# Patient Record
Sex: Female | Born: 1935 | Race: White | Hispanic: No | Smoking: Former smoker
Health system: Southern US, Community
[De-identification: ages and names within clinical notes are randomized; demographics above are authoritative.]

## PROBLEM LIST (undated history)

## (undated) DIAGNOSIS — E785 Hyperlipidemia, unspecified: Secondary | ICD-10-CM

## (undated) DIAGNOSIS — N259 Disorder resulting from impaired renal tubular function, unspecified: Secondary | ICD-10-CM

## (undated) DIAGNOSIS — H3530 Unspecified macular degeneration: Secondary | ICD-10-CM

## (undated) DIAGNOSIS — C801 Malignant (primary) neoplasm, unspecified: Secondary | ICD-10-CM

## (undated) DIAGNOSIS — J4489 Other specified chronic obstructive pulmonary disease: Secondary | ICD-10-CM

## (undated) DIAGNOSIS — R112 Nausea with vomiting, unspecified: Secondary | ICD-10-CM

## (undated) DIAGNOSIS — G459 Transient cerebral ischemic attack, unspecified: Secondary | ICD-10-CM

## (undated) DIAGNOSIS — Z9889 Other specified postprocedural states: Secondary | ICD-10-CM

## (undated) DIAGNOSIS — K449 Diaphragmatic hernia without obstruction or gangrene: Secondary | ICD-10-CM

## (undated) DIAGNOSIS — R590 Localized enlarged lymph nodes: Secondary | ICD-10-CM

## (undated) DIAGNOSIS — J329 Chronic sinusitis, unspecified: Secondary | ICD-10-CM

## (undated) DIAGNOSIS — I10 Essential (primary) hypertension: Secondary | ICD-10-CM

## (undated) DIAGNOSIS — C3491 Malignant neoplasm of unspecified part of right bronchus or lung: Secondary | ICD-10-CM

## (undated) DIAGNOSIS — J449 Chronic obstructive pulmonary disease, unspecified: Secondary | ICD-10-CM

## (undated) HISTORY — PX: TOTAL ABDOMINAL HYSTERECTOMY: SHX209

## (undated) HISTORY — PX: APPENDECTOMY: SHX54

## (undated) HISTORY — PX: INCISIONAL HERNIA REPAIR: SHX193

## (undated) HISTORY — DX: Other specified chronic obstructive pulmonary disease: J44.89

## (undated) HISTORY — DX: Diaphragmatic hernia without obstruction or gangrene: K44.9

## (undated) HISTORY — PX: OTHER SURGICAL HISTORY: SHX169

## (undated) HISTORY — DX: Localized enlarged lymph nodes: R59.0

## (undated) HISTORY — DX: Chronic sinusitis, unspecified: J32.9

## (undated) HISTORY — DX: Hyperlipidemia, unspecified: E78.5

## (undated) HISTORY — DX: Malignant neoplasm of unspecified part of right bronchus or lung: C34.91

## (undated) HISTORY — DX: Disorder resulting from impaired renal tubular function, unspecified: N25.9

## (undated) HISTORY — DX: Essential (primary) hypertension: I10

## (undated) HISTORY — PX: CATARACT EXTRACTION: SUR2

## (undated) HISTORY — DX: Chronic obstructive pulmonary disease, unspecified: J44.9

## (undated) HISTORY — DX: Unspecified macular degeneration: H35.30

## (undated) HISTORY — PX: CHOLECYSTECTOMY: SHX55

## (undated) HISTORY — PX: BILATERAL SALPINGOOPHORECTOMY: SHX1223

---

## 1997-10-01 ENCOUNTER — Other Ambulatory Visit: Admission: RE | Admit: 1997-10-01 | Discharge: 1997-10-01 | Payer: Self-pay | Admitting: *Deleted

## 2001-05-03 ENCOUNTER — Encounter: Payer: Self-pay | Admitting: *Deleted

## 2001-05-03 ENCOUNTER — Inpatient Hospital Stay (HOSPITAL_COMMUNITY): Admission: EM | Admit: 2001-05-03 | Discharge: 2001-05-05 | Payer: Self-pay | Admitting: Emergency Medicine

## 2002-06-11 ENCOUNTER — Encounter: Payer: Self-pay | Admitting: *Deleted

## 2002-06-11 ENCOUNTER — Encounter: Payer: Self-pay | Admitting: Orthopedic Surgery

## 2002-06-11 ENCOUNTER — Emergency Department (HOSPITAL_COMMUNITY): Admission: EM | Admit: 2002-06-11 | Discharge: 2002-06-11 | Payer: Self-pay | Admitting: *Deleted

## 2002-06-14 ENCOUNTER — Encounter: Admission: RE | Admit: 2002-06-14 | Discharge: 2002-06-14 | Payer: Self-pay | Admitting: Orthopedic Surgery

## 2002-06-14 ENCOUNTER — Encounter: Payer: Self-pay | Admitting: Orthopedic Surgery

## 2002-06-15 ENCOUNTER — Ambulatory Visit (HOSPITAL_BASED_OUTPATIENT_CLINIC_OR_DEPARTMENT_OTHER): Admission: RE | Admit: 2002-06-15 | Discharge: 2002-06-15 | Payer: Self-pay | Admitting: Orthopedic Surgery

## 2002-11-03 ENCOUNTER — Encounter: Payer: Self-pay | Admitting: Unknown Physician Specialty

## 2002-11-03 ENCOUNTER — Ambulatory Visit (HOSPITAL_COMMUNITY): Admission: RE | Admit: 2002-11-03 | Discharge: 2002-11-03 | Payer: Self-pay | Admitting: Unknown Physician Specialty

## 2002-12-19 ENCOUNTER — Encounter: Admission: RE | Admit: 2002-12-19 | Discharge: 2003-01-02 | Payer: Self-pay | Admitting: Specialist

## 2003-02-01 ENCOUNTER — Emergency Department (HOSPITAL_COMMUNITY): Admission: EM | Admit: 2003-02-01 | Discharge: 2003-02-01 | Payer: Self-pay

## 2003-02-01 ENCOUNTER — Encounter: Payer: Self-pay | Admitting: Internal Medicine

## 2003-04-09 ENCOUNTER — Encounter: Admission: RE | Admit: 2003-04-09 | Discharge: 2003-04-09 | Payer: Self-pay | Admitting: Internal Medicine

## 2003-11-14 ENCOUNTER — Encounter: Admission: RE | Admit: 2003-11-14 | Discharge: 2003-11-14 | Payer: Self-pay | Admitting: Internal Medicine

## 2004-07-14 ENCOUNTER — Ambulatory Visit: Payer: Self-pay | Admitting: Internal Medicine

## 2004-10-09 ENCOUNTER — Ambulatory Visit: Payer: Self-pay | Admitting: Family Medicine

## 2004-11-11 ENCOUNTER — Ambulatory Visit: Payer: Self-pay | Admitting: Internal Medicine

## 2005-03-12 ENCOUNTER — Ambulatory Visit: Payer: Self-pay | Admitting: Internal Medicine

## 2005-04-29 ENCOUNTER — Ambulatory Visit: Payer: Self-pay | Admitting: Family Medicine

## 2005-07-09 ENCOUNTER — Ambulatory Visit: Payer: Self-pay | Admitting: Internal Medicine

## 2005-08-05 ENCOUNTER — Ambulatory Visit: Payer: Self-pay | Admitting: Family Medicine

## 2006-01-05 ENCOUNTER — Ambulatory Visit: Payer: Self-pay | Admitting: Family Medicine

## 2006-01-07 ENCOUNTER — Ambulatory Visit: Payer: Self-pay | Admitting: Internal Medicine

## 2006-03-08 ENCOUNTER — Ambulatory Visit: Payer: Self-pay | Admitting: Family Medicine

## 2006-03-12 ENCOUNTER — Ambulatory Visit: Payer: Self-pay | Admitting: Family Medicine

## 2006-07-08 ENCOUNTER — Ambulatory Visit: Payer: Self-pay | Admitting: Internal Medicine

## 2006-08-05 ENCOUNTER — Ambulatory Visit: Payer: Self-pay | Admitting: Family Medicine

## 2006-08-13 ENCOUNTER — Emergency Department (HOSPITAL_COMMUNITY): Admission: EM | Admit: 2006-08-13 | Discharge: 2006-08-13 | Payer: Self-pay | Admitting: Emergency Medicine

## 2006-08-16 ENCOUNTER — Ambulatory Visit: Payer: Self-pay | Admitting: Family Medicine

## 2006-09-29 ENCOUNTER — Ambulatory Visit: Payer: Self-pay | Admitting: Family Medicine

## 2007-01-20 ENCOUNTER — Ambulatory Visit: Payer: Self-pay | Admitting: Internal Medicine

## 2007-07-18 DIAGNOSIS — I10 Essential (primary) hypertension: Secondary | ICD-10-CM

## 2007-07-18 DIAGNOSIS — J329 Chronic sinusitis, unspecified: Secondary | ICD-10-CM | POA: Insufficient documentation

## 2007-07-18 DIAGNOSIS — N259 Disorder resulting from impaired renal tubular function, unspecified: Secondary | ICD-10-CM | POA: Insufficient documentation

## 2007-07-18 DIAGNOSIS — K449 Diaphragmatic hernia without obstruction or gangrene: Secondary | ICD-10-CM | POA: Insufficient documentation

## 2007-08-02 ENCOUNTER — Ambulatory Visit: Payer: Self-pay | Admitting: Internal Medicine

## 2007-08-07 DIAGNOSIS — J449 Chronic obstructive pulmonary disease, unspecified: Secondary | ICD-10-CM

## 2007-08-07 DIAGNOSIS — J4489 Other specified chronic obstructive pulmonary disease: Secondary | ICD-10-CM | POA: Insufficient documentation

## 2007-08-07 DIAGNOSIS — E785 Hyperlipidemia, unspecified: Secondary | ICD-10-CM

## 2008-02-02 ENCOUNTER — Ambulatory Visit: Payer: Self-pay | Admitting: Internal Medicine

## 2008-02-02 DIAGNOSIS — H3530 Unspecified macular degeneration: Secondary | ICD-10-CM | POA: Insufficient documentation

## 2008-07-31 ENCOUNTER — Ambulatory Visit: Payer: Self-pay | Admitting: Internal Medicine

## 2009-01-28 ENCOUNTER — Ambulatory Visit: Payer: Self-pay | Admitting: Internal Medicine

## 2009-01-28 DIAGNOSIS — J302 Other seasonal allergic rhinitis: Secondary | ICD-10-CM | POA: Insufficient documentation

## 2009-01-28 DIAGNOSIS — J3089 Other allergic rhinitis: Secondary | ICD-10-CM

## 2009-07-29 ENCOUNTER — Ambulatory Visit: Payer: Self-pay | Admitting: Internal Medicine

## 2010-01-27 ENCOUNTER — Ambulatory Visit: Payer: Self-pay | Admitting: Internal Medicine

## 2010-02-03 ENCOUNTER — Ambulatory Visit: Payer: Self-pay | Admitting: Internal Medicine

## 2010-06-19 NOTE — Assessment & Plan Note (Signed)
Summary: rov 6 months///kp   Primary Provider/Referring Provider:  Ardeen Garland  CC:  6 month follow up visit-COPD and rhinitis. Having SOB alot-with activity..  History of Present Illness: 01/28/09 COPD, hx chronic sinusitis, large hiatal hernia Blames ragweed now for increased nasal congestion. She sits with a lady whose home is surrounded by ragweed. Using an old rescue albuterol inhaler occasionally.  July 29, 2009- COPD, hx chronic sinusitis, large hiatal henia Discussed her SABA rescue inhaler which she uses not over once/ week. Had 3 episodes of sinus congestion before Christmas and took Z pak twice. Now notes sneeze and postnasla drip if outdoors much. Dr Lysbeth Galas had given her a nasal spray she can't name and isn't needing now. Denies headache, purulent or bloddy nasal discharge, cough or wheeze, chest pain or palpitation. Denies awareness of reflux or heartburn, crediting ginger root. Rarely needs a Tums.  January 27, 2010- COPD, hx chronic sinusitis, large hiatal henia Likes the cooler weather and considers this a well day, but has begun noting sneezing outdoors. Has not been stuffy and denies wheeze, chest tightness. Got tight next to a woman with heavy perfume. Noices DOE now across a street, but this is a very slow gradual change over a couple of years. Denies chest pain, palpitation, swelling..   Preventive Screening-Counseling & Management  Alcohol-Tobacco     Smoking Status: quit     Year Quit: 1991     Pack years: 2 ppd x 40 years  Current Medications (verified): 1)  Spiriva Handihaler 18 Mcg  Caps (Tiotropium Bromide Monohydrate) .... Inhale Contents of 1 Capsule Once A Day 2)  Furosemide 40 Mg  Tabs (Furosemide) .... Take 1 Tablet By Mouth Once A Day 3)  Klor-Con 10 10 Meq  Tbcr (Potassium Chloride) .... Take 1 Tablet By Mouth Once A Day 4)  Ecotrin 325 Mg  Tbec (Aspirin) .... Take 1 Tablet By Mouth Once A Day 5)  Ginger Root 500 Mg  Caps (Ginger (Zingiber  Officinalis)) .... Take 2 Capsule By Mouth Once A Day 6)  Vitamin C 500 Mg  Tabs (Ascorbic Acid) .... Take 1 Tablet By Mouth Once A Day 7)  Centrum Silver   Tabs (Multiple Vitamins-Minerals) .... Take 1 Tablet By Mouth Once A Day 8)  Micardis 40 Mg  Tabs (Telmisartan) .... Take 1 Tablet By Mouth Once A Day 9)  Caltrate 600+d 600-400 Mg-Unit  Tabs (Calcium Carbonate-Vitamin D) .... Take 1 Tablet By Mouth Once A Day 10)  Proair Hfa 108 (90 Base) Mcg/act Aers (Albuterol Sulfate) .... 2 Puffs Four Times A Day As Needed - Rescue 11)  Welchol 625 Mg  Tabs (Colesevelam Hcl) .... Take 2 By Mouth Before Each Meal Three Times A Day 12)  Preservision Areds  Caps (Multiple Vitamins-Minerals) .... Take 1 By Mouth Once Daily 13)  Chemo Injection .... Placed in Right Eye Every 9 Weeks 14)  Fish Oil 1000 Mg Caps (Omega-3 Fatty Acids) .... Two Capsules Two Times A Day  Allergies (verified): 1)  ! Penicillin 2)  ! * Avelox 3)  ! Pravachol  Past History:  Past Medical History: Last updated: 02/02/2008 C O P D (ICD-496) SINUSITIS, CHRONIC (ICD-473.9) * MEDIASTINAL ADENOPATHY HIATAL HERNIA (ICD-553.3) MACULAR DEGENERATION, BILATERAL (ICD-362.50) HYPERLIPIDEMIA (ICD-272.4) HYPERTENSION (ICD-401.9) RENAL INSUFFICIENCY (ICD-588.9)  Past Surgical History: Last updated: 07/29/2009 repair arm fx Cholecystectomy T A H and B S O abdominal incisional hernia repair Appendectomy cataract  Family History: Last updated: 02/02/2008 emphysema - father Heart disease - mother  dec'd age 38 Lung cancer - father  Social History: Last updated: 07/31/2008 Patient states former smoker.  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  The patient denies shortness of breath  at rest, productive cough, non-productive cough, coughing up blood, chest pain, irregular heartbeats, acid heartburn, indigestion, loss of appetite, weight change, abdominal pain, difficulty swallowing, sore throat, tooth/dental problems, and headaches.    Vital Signs:  Patient profile:   77 year old female Height:      64 inches Weight:      167 pounds BMI:     28.77 BP sitting:   122 / 70  (right arm) Cuff size:   regular  Vitals Entered By: Reynaldo Minium CMA (January 27, 2010 10:22 AM)  O2 Flow:  Room air CC: 6 month follow up visit-COPD and rhinitis. Having SOB alot-with activity.   Physical Exam  Additional Exam:  General: A/Ox3; pleasant and cooperative, NAD, SKIN: no rash, lesions NODES: no lymphadenopathy HEENT: [place]/AT, EOM- WNL, Conjuctivae- clear, PERRLA, TM-WNL, Nose- clear, Throat- clear and wnl, dentures, Mallampati  II NECK: Supple w/ fair ROM, JVD- none, normal carotid impulses w/o bruits Thyroid-  CHEST: Clear to P&A, decreased but wihout wheeze HEART: RRR, no m/g/r heard ABDOMEN: Soft and nl;  EAV:WUJW, nl pulses, no edema  NEURO: Grossly intact to observation      Impression & Recommendations:  Problem # 1:  RHINITIS (ICD-472.0)  Seasonal allergic rhinitis. I will suggest otc antihistamines. We are at peak of ragweed pollen now, so if she can manage conservatively for awhile more she should do ok.  Problem # 2:  C O P D (ICD-496) Slow progression over time, but without acute exacerbation. Her Spiriva is sufficient.   We reviewed her CXR done in March, showing COPD and hiatal hernia.  We will update PFT  Flu vax discussed.  Problem # 3:  HIATAL HERNIA (ICD-553.3) Known large hiatal hernia. We again reviewed reflux and aspiration precautions, especially to avoid lying down after meals.  Medications Added to Medication List This Visit: 1)  Chemo Injection  .... Placed in right eye every 9 weeks  Other Orders: Est. Patient Level IV (11914) Full  Pulmonary Function Test (PFT)  Patient Instructions: 1)  Please schedule a follow-up appointment in 6 months. 2)  Flu vax 3)  See [facility] to schedule PFT

## 2010-06-19 NOTE — Miscellaneous (Signed)
Summary: Orders Update pft charges  Clinical Lists Changes  Orders: Added new Service order of Carbon Monoxide diffusing w/capacity (94720) - Signed Added new Service order of Lung Volumes (94240) - Signed Added new Service order of Spirometry (Pre & Post) (94060) - Signed 

## 2010-06-19 NOTE — Assessment & Plan Note (Signed)
Summary: rov 6 months///kp   Primary Provider/Referring Provider:  Ardeen Garland  CC:  6 month follow up visit-no complaints..  History of Present Illness:  07/31/08- COPD, hx chronic sinusitis, large hiatal hernia Had one sinus infection during the winter, responded to Zpak. Had both flu shots. Denies routine cough or phlegm, headache or pndrip, chest pain or palpitation.  01/28/09 COPD, hx chronic sinusitis, large hiatal hernia Blames ragweed now for increased nasal congestion. She sits with a lady whose home is surrounded by ragweed. Using an old rescue albuterol inhaler occasionally.  July 29, 2009- COPD, hx chronic sinusitis, large hiatal henia Discussed her SABA rescue inhaler which she uses not over once/ week. Had 3 episodes of sinus congestion before Christmas and took Z pak twice. Now notes sneeze and postnasla drip if outdoors much. Dr Lysbeth Galas had given her a nasal spray she can't name and isn't needing now. Denies headache, purulent or bloddy nasal discharge, cough or wheeze, chest pain or palpitation. Denies awareness of reflux or heartburn, crediting ginger root. Rarely needs a Tums.   Current Medications (verified): 1)  Spiriva Handihaler 18 Mcg  Caps (Tiotropium Bromide Monohydrate) .... Inhale Contents of 1 Capsule Once A Day 2)  Furosemide 40 Mg  Tabs (Furosemide) .... Take 1 Tablet By Mouth Once A Day 3)  Klor-Con 10 10 Meq  Tbcr (Potassium Chloride) .... Take 1 Tablet By Mouth Once A Day 4)  Ecotrin 325 Mg  Tbec (Aspirin) .... Take 1 Tablet By Mouth Once A Day 5)  Ginger Root 500 Mg  Caps (Ginger (Zingiber Officinalis)) .... Take 2 Capsule By Mouth Once A Day 6)  Vitamin C 500 Mg  Tabs (Ascorbic Acid) .... Take 1 Tablet By Mouth Once A Day 7)  Centrum Silver   Tabs (Multiple Vitamins-Minerals) .... Take 1 Tablet By Mouth Once A Day 8)  Micardis 40 Mg  Tabs (Telmisartan) .... Take 1 Tablet By Mouth Once A Day 9)  Caltrate 600+d 600-400 Mg-Unit  Tabs (Calcium  Carbonate-Vitamin D) .... Take 1 Tablet By Mouth Once A Day 10)  Proair Hfa 108 (90 Base) Mcg/act Aers (Albuterol Sulfate) .... 2 Puffs Four Times A Day As Needed - Rescue 11)  Welchol 625 Mg  Tabs (Colesevelam Hcl) .... Take 2 By Mouth Before Each Meal Three Times A Day 12)  Preservision Areds  Caps (Multiple Vitamins-Minerals) .... Take 1 By Mouth Once Daily 13)  Chemo Injection .... Placed in Right Eye Every 8 Weeks 14)  Fish Oil 1000 Mg Caps (Omega-3 Fatty Acids) .... Two Capsules Two Times A Day  Allergies (verified): 1)  ! Penicillin 2)  ! * Avelox 3)  ! Pravachol  Past History:  Past Medical History: Last updated: 02/02/2008 C O P D (ICD-496) SINUSITIS, CHRONIC (ICD-473.9) * MEDIASTINAL ADENOPATHY HIATAL HERNIA (ICD-553.3) MACULAR DEGENERATION, BILATERAL (ICD-362.50) HYPERLIPIDEMIA (ICD-272.4) HYPERTENSION (ICD-401.9) RENAL INSUFFICIENCY (ICD-588.9)  Family History: Last updated: 02/02/2008 emphysema - father Heart disease - mother dec'd age 71 Lung cancer - father  Social History: Last updated: 07/31/2008 Patient states former smoker.  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    Vital Signs:  Patient profile:   77 year old female Height:      64 inches Weight:      169.13 pounds BMI:     29.14 O2 Sat:      95 % on Room air Pulse rate:   90 / minute BP sitting:   118 / 80  (left arm) Cuff size:   regular  Vitals Entered By: Reynaldo Minium CMA (July 29, 2009 10:43 AM)  O2  Flow:  Room air  Physical Exam  Additional Exam:  General: A/Ox3; pleasant and cooperative, NAD, SKIN: no rash, lesions NODES: no lymphadenopathy HEENT: [place]/AT, EOM- WNL, Conjuctivae- clear, PERRLA, TM-WNL, Nose- clear, Throat- clear and wnl, dentures, Mallampati   NECK: Supple w/ fair ROM, JVD- none, normal carotid impulses w/o bruits Thyroid-  CHEST: Clear to P&A HEART: RRR, no m/g/r heard ABDOMEN: Soft and nl;  NWG:NFAO, nl pulses, no edema  NEURO: Grossly intact to observation      Impression & Recommendations:  Problem # 1:  C O P D (ICD-496) No exacerbation this Spring. We will update CXR with hx of mediastinal adenopathy noted in 2008.  Problem # 2:  RHINITIS (ICD-472.0)  No seasonal exacerbation yet. We discussed meds and expectations.  Medications Added to Medication List This Visit: 1)  Welchol 625 Mg Tabs (Colesevelam hcl) .... Take 2 by mouth before each meal three times a day 2)  Preservision Areds Caps (Multiple vitamins-minerals) .... Take 1 by mouth once daily 3)  Fish Oil 1000 Mg Caps (Omega-3 fatty acids) .... Two capsules two times a day  Other Orders: Est. Patient Level III (13086) T-2 View CXR (71020TC)  Patient Instructions: 1)  Please schedule a follow-up appointment in 6 months. 2)  A chest x-ray has been recommended.  Your imaging study may require preauthorization.

## 2010-07-28 ENCOUNTER — Ambulatory Visit (INDEPENDENT_AMBULATORY_CARE_PROVIDER_SITE_OTHER)
Admission: RE | Admit: 2010-07-28 | Discharge: 2010-07-28 | Disposition: A | Discharge status: Home / Self Care | Payer: Medicare Other | Source: Ambulatory Visit | Attending: Internal Medicine | Admitting: Internal Medicine

## 2010-07-28 ENCOUNTER — Other Ambulatory Visit: Payer: Self-pay | Admitting: Internal Medicine

## 2010-07-28 ENCOUNTER — Encounter: Payer: Self-pay | Admitting: Internal Medicine

## 2010-07-28 ENCOUNTER — Ambulatory Visit (INDEPENDENT_AMBULATORY_CARE_PROVIDER_SITE_OTHER): Payer: Medicare Other | Admitting: Internal Medicine

## 2010-07-28 DIAGNOSIS — J449 Chronic obstructive pulmonary disease, unspecified: Secondary | ICD-10-CM

## 2010-07-28 DIAGNOSIS — J310 Chronic rhinitis: Secondary | ICD-10-CM

## 2010-07-28 DIAGNOSIS — J4489 Other specified chronic obstructive pulmonary disease: Secondary | ICD-10-CM

## 2010-08-05 NOTE — Assessment & Plan Note (Signed)
Summary: 6 month rov   Primary Provider/Referring Provider:  Ardeen Garland  CC:  6 month follow up visit-COPD; having runny eyes and nose; SOB. Using a decongestant daily.Marland Kitchen  History of Present Illness: July 29, 2009- COPD, hx chronic sinusitis, large hiatal henia Discussed her SABA rescue inhaler which she uses not over once/ week. Had 3 episodes of sinus congestion before Christmas and took Z pak twice. Now notes sneeze and postnasla drip if outdoors much. Dr Lysbeth Galas had given her a nasal spray she can't name and isn't needing now. Denies headache, purulent or bloddy nasal discharge, cough or wheeze, chest pain or palpitation. Denies awareness of reflux or heartburn, crediting ginger root. Rarely needs a Tums.  January 27, 2010- COPD, hx chronic sinusitis, large hiatal henia Likes the cooler weather and considers this a well day, but has begun noting sneezing outdoors. Has not been stuffy and denies wheeze, chest tightness. Got tight next to a woman with heavy perfume. Noices DOE now across a street, but this is a very slow gradual change over a couple of years. Denies chest pain, palpitation, swelling..  July 28, 2010-  COPD, hx chronic sinusitis, large hiatal hernia Nurse-CC: 6 month follow up visit-COPD; having runny eyes and nose; SOB. Using a decongestant daily. PFT 02/03/10- mild obstruction, esp small airways; no resp to BD; Mild restriction; severe reduction of DLCO.CXR 07/1999- COPD, large hiatal hernia. Says she is ok with no colds at all this winter. Now the pollen is getting to her, with recent onset sneeze, watery eyes and nose, some increased short of breath. Getting  an otc ? antihistamine. Denies waking with reflux or choking.    Preventive Screening-Counseling & Management  Alcohol-Tobacco     Smoking Status: quit     Year Quit: 1991     Pack years: 2 ppd x 40 years  Current Medications (verified): 1)  Spiriva Handihaler 18 Mcg  Caps (Tiotropium Bromide Monohydrate) ....  Inhale Contents of 1 Capsule Once A Day 2)  Furosemide 40 Mg  Tabs (Furosemide) .... Take 1 Tablet By Mouth Once A Day 3)  Klor-Con 10 10 Meq  Tbcr (Potassium Chloride) .... Take 1 Tablet By Mouth Once A Day 4)  Ecotrin 325 Mg  Tbec (Aspirin) .... Take 1 Tablet By Mouth Once A Day 5)  Ginger Root 500 Mg  Caps (Ginger (Zingiber Officinalis)) .... Take 2 Capsule By Mouth Once A Day 6)  Vitamin C 500 Mg  Tabs (Ascorbic Acid) .... Take 1 Tablet By Mouth Once A Day 7)  Centrum Silver   Tabs (Multiple Vitamins-Minerals) .... Take 1 Tablet By Mouth Once A Day 8)  Micardis 40 Mg  Tabs (Telmisartan) .... Take 1 Tablet By Mouth Once A Day 9)  Caltrate 600+d 600-400 Mg-Unit  Tabs (Calcium Carbonate-Vitamin D) .... Take 1 Tablet By Mouth Once A Day 10)  Proair Hfa 108 (90 Base) Mcg/act Aers (Albuterol Sulfate) .... 2 Puffs Four Times A Day As Needed - Rescue 11)  Welchol 625 Mg  Tabs (Colesevelam Hcl) .... Take 2 By Mouth Before Each Meal Three Times A Day 12)  Preservision Areds  Caps (Multiple Vitamins-Minerals) .... Take 1 By Mouth Once Daily 13)  Chemo Injection .... Placed in Right Eye Every 9 Weeks 14)  Fish Oil 1000 Mg Caps (Omega-3 Fatty Acids) .... Two Capsules Two Times A Day  Allergies (verified): 1)  ! Penicillin 2)  ! * Avelox 3)  ! Pravachol  Past History:  Past Surgical History: Last  updated: 07/29/2009 repair arm fx Cholecystectomy T A H and B S O abdominal incisional hernia repair Appendectomy cataract  Family History: Last updated: 02/02/2008 emphysema - father Heart disease - mother dec'd age 35 Lung cancer - father  Social History: Last updated: 07/31/2008 Patient states former smoker.  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  Risk Factors: Smoking Status: quit (07/28/2010)  Past Medical History: C O P D (ICD-496)            PFT- 02/03/10- FEV1/FVC 0.74, no resp to BD, mild obstruction small airways, mild restriction, TLC 0.68,                                  Diffusion severely reduced 0.34 SINUSITIS, CHRONIC (ICD-473.9) * MEDIASTINAL ADENOPATHY HIATAL HERNIA (ICD-553.3) MACULAR DEGENERATION, BILATERAL (ICD-362.50) HYPERLIPIDEMIA (ICD-272.4) HYPERTENSION (ICD-401.9) RENAL INSUFFICIENCY (ICD-588.9)  Review of Systems      See HPI       The patient complains of nasal congestion/difficulty breathing through nose and sneezing.  The patient denies shortness of breath with activity, shortness of breath at rest, productive cough, non-productive cough, coughing up blood, chest pain, irregular heartbeats, acid heartburn, indigestion, loss of appetite, weight change, abdominal pain, difficulty swallowing, sore throat, tooth/dental problems, headaches, ear ache, rash, change in color of mucus, and fever.    Vital Signs:  Patient profile:   77 year old female Height:      64 inches Weight:      171.25 pounds BMI:     29.50 O2 Sat:      99 % on Room air Pulse rate:   80 / minute BP sitting:   112 / 72  (left arm) Cuff size:   regular  Vitals Entered By: Reynaldo Minium CMA (July 28, 2010 10:34 AM)  O2 Flow:  Room air CC: 6 month follow up visit-COPD; having runny eyes and nose; SOB. Using a decongestant daily.   Physical Exam  Additional Exam:  General: A/Ox3; pleasant and cooperative, NAD, SKIN: no rash, lesions NODES: no lymphadenopathy HEENT: /AT, EOM- WNL, Conjuctivae- clear, PERRLA, TM-WNL, Nose- clear, Throat- clear and wnl, dentures, Mallampati  II NECK: Supple w/ fair ROM, JVD- none, normal carotid impulses w/o bruits Thyroid-  CHEST: Clear to P&A, decreased but wihout wheeze, mild kyphosis HEART: RRR, no m/g/r heard ABDOMEN: Soft and nl;  JXB:JYNW, nl pulses, no edema  NEURO: Grossly intact to observation      Impression & Recommendations:  Problem # 1:  RHINITIS (ICD-472.0)  Seasonal allergy flare. Never on allergy vaccine. Will see if a stronger antihistamine will suffice. Discussed difference  between decongestants and antihistamines.   Problem # 2:  C O P D (ICD-496) COPD indicated by the mild obstruction and severe DLCO reduction. There is little bronchspasm and not much room to help with meds. Encouraged to walk for stamina. Continues Spiriva and Proair(rarely needed) We will update CXR  Problem # 3:  HIATAL HERNIA (ICD-553.3) I reviewed reflux precautions again. This is big enough to cause some restriction of lung volume especially after meals.   Medications Added to Medication List This Visit: 1)  Fexofenadine Hcl 180 Mg Tabs (Fexofenadine hcl) .Marland Kitchen.. 1 daily as needed allergy  Other Orders: Est. Patient Level III (29562) T-2 View CXR (71020TC)  Patient Instructions: 1)  Please schedule a follow-up appointment in 6 months. 2)  A chest x-ray has been recommended.  Your  imaging study may require preauthorization.  3)  Try otc antihistamine Allegra 180/ fexofenadine 180- see if this works better to keep your head dried up during pollen season.

## 2010-09-30 NOTE — Assessment & Plan Note (Signed)
[facility]                             PULMONARY OFFICE NOTE   CABRINI, RUGGIERI                          MRN:          [id number]  DATE:01/20/2007                            DOB:          [birth year]    PROBLEM LIST:  1. Chronic obstructive pulmonary disease.  2. Chronic sinusitis.  3. Mediastinal adenopathy.  4. Renal insufficiency.  5. Hypertension.  6. Hiatal hernia.   HISTORY:  Coughing for five days, not a cold but yellow phlegm.  Increased feeling of bilateral sinus pressure.  No fever that she has  recognized.   MEDICATIONS:  Her list is reviewed with no changes noted.   OBJECTIVE:  VITAL SIGNS:  Weight 178 pounds, blood pressure 118/78,  pulse 104, room air saturation 95%.  HEENT:  Her nose is mildly congested.  Pharynx is not red.  I do not  find adenopathy.  LUNGS:  Breath sounds are diminished bilaterally, but without rales,  rhonchi, or wheeze.  HEART:  Sounds are regular without murmur.   IMPRESSION:  Bronchitis exacerbation of chronic obstructive pulmonary  disease.   PLAN:  Treatment options were discussed and conservative care measures  outlined.  We are providing Z-Pak for five days.  Schedule return in 6  months, earlier p.r.n.     Clinton D. Maple Hudson, MD, Tonny Bollman, FACP  Electronically Signed    CDY/MedQ  DD: 01/23/2007  DT: 01/23/2007  Job #: [id number]   cc:   [name], M.D.  [facility] Salvia Eliott Nine, M.D.

## 2010-10-03 NOTE — H&P (Signed)
[facility]  Patient:    Meghan Castro, Meghan Castro Visit Number: [id number] MRN: [id number]          Service Type: MED Location: [id number] Attending Physician:  Lorre Nick Dictated by:   Veneda Melter, M.D. [facility] Admit Date:  05/03/2001   CC:         Colon Flattery, D.O.   History and Physical  HISTORY OF PRESENT ILLNESS:  Meghan Castro is a pleasant, 77 year old, white female with no prior cardiac history, who presents for assessment of substernal chest discomfort.  The patient was in her usual state of health and was working at [employer] today.  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  This was associated with nausea, diaphoresis, and shortness of breath.  After resting for a few minutes, this was relieved, however, with any activity for the remainder of the day, she had recurrence of discomfort.  She had a total of five episodes of chest discomfort.  She was finally sent to her interns office and was seen by Molly Maduro L. Allen Derry, M.D., who recommended that she be admitted to the hospital.  Currently in the emergency room, she denies any chest pain.  She has not had any recent chest discomfort at rest or with exertion.  She denies any shortness of breath, orthopnea, or paroxysmal nocturnal dyspnea.  She has not had any lower extremity edema.  She has not had any lower extremity edema.  She has not had any syncope or presyncope. She does have some mild symptoms of reflux.  REVIEW OF SYSTEMS:  Otherwise noncontributory.  PAST MEDICAL HISTORY:  Notable for nephrotic syndrome, history of a stroke in 1997 with no significant residual, asthma, early obstructive lung disease, and status post cholecystectomy.  ALLERGIES:  PENICILLIN.  MEDICATIONS: 1. Lasix 40 mg q.d. as needed for swelling. 2. Aspirin 325 mg q.d. 3. Vitamins C and E. 4. Ginger root.  SOCIAL HISTORY:  The patient lives  alone in [place], [place].  She works at [employer].  She has a 60-pack-year history of tobacco use.  She quit 11 years ago.  Denies alcohol use.  FAMILY HISTORY:  Her mother is deceased.  She died in her 34s of myocardial infarction.  Her father had cancer and died at an unknown age.  One sister had a myocardial infarction in her 46s.  PHYSICAL EXAMINATION:  She is a well-developed, well-nourished, white female in no acute distress.  VITAL SIGNS:  Blood pressure 132/79, pulse 72, respirations 16.  HEENT:  Pupils are equal, round, and reactive to light.  Extraocular muscles are intact.  Oropharynx with no lesions.  NECK:  Supple without bruits.  HEART:  Regular rate without murmurs.  LUNGS:  Clear to auscultation.  ABDOMEN:  Soft and nontender.  EXTREMITIES:  No edema.  Peripheral pulses 2+ and equal bilaterally.  SKIN:  No rashes.  NEUROLOGIC:  Motor strength is 5/5.  Sensory is intact to touch.  LABORATORY DATA:  The ECG is sinus rate at 74 with no acute ischemic changes. The chest x-ray is pending.  The white blood cell count is 4.5, hemoglobin 12.1, hematocrit 35.9, and platelets 240.  Cardiac enzymes are pending.  ASSESSMENT AND PLAN:  Meghan Castro is a 77 year old white female with a strong history of tobacco use, a family history of tobacco use, and dyslipidemia, who presents with substernal chest discomfort occurring with minimal exertion. Her story is worrisome  for unstable angina.  The patient will be admitted to the hospital for telemetry monitoring.  We will anticoagulate her with Lovenox and rule her out for acute myocardial infarction with serial cardiac enzymes and ECGs.  Given her strong pretest probability for coronary disease, we have recommended cardiac catheterization.  The risks, benefits, and alternatives of left heart catheterization and possible percutaneous intervention were discussed with the patient and her son.  They understand and agree to  proceed. The patient has been on Lipitor, as well as Zocor in the past for her dyslipidemia, however, she has been intolerant of this in the past.  We will consider trial of a different statin and consider referral to our lipid clinic.  Dictated by:   Veneda Melter, M.D. [facility] Attending Physician:  Lorre Nick DD:  05/03/01 TD:  05/03/01 Job: [id number]

## 2010-10-03 NOTE — Op Note (Signed)
NAME:  Meghan Castro, Meghan Castro                             ACCOUNT NO.:  [id number]   MEDICAL RECORD NO.:  [id number]                   PATIENT TYPE:  AMB   LOCATION:  DSC                                  FACILITY:  [facility]   PHYSICIAN:  Cindee Salt, M.D.                    DATE OF BIRTH:  21-Jun-1928   DATE OF PROCEDURE:  06/15/2002  DATE OF DISCHARGE:                                 OPERATIVE REPORT   PREOPERATIVE DIAGNOSIS:  Fractured left distal radius.   POSTOPERATIVE DIAGNOSIS:  Fractured left distal radius.   OPERATION:  Open reduction and internal fixation, distal radius left wrist.   SURGEON:  Cindee Salt, M.D.   ASSISTANT:  Corinna Lines, Nurse Specialist.   ANESTHESIA:  Axillary block.   HISTORY:  The patient is a 77 year old female with a history of a fall and a  fracture of her distal radius, which has redisplaced in her splint.   PROCEDURE:  The patient was brought to the operating room where an axillary  block was carried out without difficulty.  She was prepped and draped using  DuraPrep to the left arm, free supine position.  An incision was made over  the flexor carpi radialis, carried slightly to the radial side of her wrist  distally, carried down through subcutaneous tissues.  Bleeders were  electrocauterized.  Dissection was carried between the flexor carpi radialis  and the radial artery down to the pronator quadratus.  This was elevated.  The fracture was immediately apparent.  A flap of pronator was then made.  This was elevated off from the distal radius.  The fracture was reduced,  confirmed under [facility] image intensification, DDR plate.  Standard left was  then placed.  The adjustable drill hole was made for localization of the  plate.  This was drilled and tapped.  A 14 mm screw was placed.  The  remaining screws were 12 mm.  The distal one was slightly short.  The  variable screw was slightly long, and these were replaced after adjustment  of the place under [facility]  image intensification.  The guide was then placed for  the distal screw pegs.  These were each inserted, measured 20-22 mm.  Only  pegs were used, no screws were used.  X-rays confirmed reduced position of  the fracture fragment with no pegs in the joint space.  The wound was  copiously irrigated with saline.  The pronator repaired with figure-of-eight  4-0 Vicryl sutures, and subcutaneous tissue with 4-0 Vicryl, and the skin  with a subcuticular 4-0 Monocryl suture.  Steri-strips were applied.  Sterile compressive dressing and splint was applied.  The patient tolerated  the procedure well and was taken to the recovery room for observation in  satisfactory condition.  She is discharged home to return to the [facility]  in [place] in one week on Vicodin  and Septra DS.                                               Cindee Salt, M.D.    Angelique Blonder  D:  06/15/2002  T:  06/15/2002  Job:  [id number]

## 2010-10-03 NOTE — Cardiovascular Report (Signed)
[place]  Patient:    Meghan Castro, Meghan Castro Visit Number: [id number] MRN: [id number]          Service Type: MED Location: [id number] [phone] Attending Physician:  Veneda Melter Dictated by:   Daisey Must, M.D. [facility] Proc. Date: 05/05/01 Admit Date:  05/03/2001   CC:         Colon Flattery, D.O.  Veneda Melter, M.D.  Cardiac Catheterization Laboratory   Cardiac Catheterization  PROCEDURES PERFORMED: Left heart catheterization with coronary angiography and left ventriculography.  INDICATIONS: The patient is a 77 year old woman who was admitted with chest pain that was worrisome for unstable angina. She was referred for cardiac catheterization.  DESCRIPTION OF PROCEDURE: A 6 French sheath was placed in the right femoral artery.  Coronary arteriography was performed with Standard Judkins 6 French catheters. Left ventriculography was performed with a 6 French angled pigtail catheter. Contrast was Omnipaque.  There were no complications.  RESULTS:  HEMODYNAMICS: Left ventricular pressure 120/18.  Aortic pressure 126/66. There was no aortic valve gradient.  LEFT VENTRICULOGRAM: Wall motion is normal. Ejection fraction calculated at 71%. There is no mitral regurgitation.  CORONARY ARTERIOGRAPHY: (Right dominant).  Left main: Normal.  Left anterior descending: The left anterior descending artery has minor luminal irregularities in the mid vessel. The LAD gives rise to a normal sized diagonal branch.  Left circumflex: The left circumflex has minor luminal irregularities in the mid vessel extending into OM-2. The circumflex gives rise to a small OM-1, large branching OM-2 and a small OM-3.  Right coronary artery: The right coronary artery is a dominant vessel. It gives rise to a normal sized posterior descending artery and two small posterolateral branches. The right coronary artery is normal by angiography.  IMPRESSIONS: 1. Normal left ventricular  systolic function. 2. Normal coronary arteries by angiography. Dictated by:   Daisey Must, M.D. [facility] Attending Physician:  Veneda Melter DD:  05/05/01 TD:  05/05/01 Job: [id number]

## 2010-10-03 NOTE — Assessment & Plan Note (Signed)
[facility]                               PULMONARY OFFICE NOTE   Meghan Castro, Meghan Castro                          MRN:          [id number]  DATE:01/07/2006                            DOB:          [birth year]    PULMONARY FOLLOW-UP:   PROBLEM LIST:  1. Chronic obstructive pulmonary disease.  2. Chronic sinusitis.  3. Mediastinal adenopathy.  4. Renal insufficiency, question nephrotic syndrome.  5. Hypertension.  6. Hiatal hernia.   HISTORY:  Six-month follow-up.  She told me about her adjustments in blood  pressure medications, now trying Micardis.  Her breathing has been stable as  long as she stays indoors all the time, avoiding the heat and air quality  issues.  With regular use of Spiriva she is only using albuterol once or  twice a month.  She recognizes little heartburn.  Dr. Lysbeth Galas started  Nasonex, which we discussed.   MEDICATIONS:  1. Spiriva.  2. Furosemide 40 mg.  3. Klor-Con.  4. Ecotrin.  5. Gingerroot.  6. Multivitamins.  7. Micardis 40 mg.  8. Albuterol inhaler.   Drug-intolerant to penicillin, Nasonex.   OBJECTIVE:  VITAL SIGNS:  Weight 179 pounds, BP 130/80, pulse regular at 85,  room air saturation 96%.  GENERAL:  She looks comfortable.  I find no adenopathy, edema or cyanosis.  HEENT:  Neck veins are not distended.  Voice quality is normal and pharynx  clear.  Nose is unremarkable with normal mucosa.  LUNGS:  Clear and quiet.  Breathing is unlabored.  CARDIAC:  Heart sounds are regular without murmur or gallop.   IMPRESSION:  Stable asthma with chronic obstructive pulmonary disease.  Note  that chest x-ray in February showed slight chronic obstructive pulmonary  disease, moderate hiatal hernia, no active disease.  Previous pulmonary  function tests had shown FEV1 about 68% of predicted.   PLAN:  No changes to recommend.  Schedule return 6 months, earlier p.r.n.  She is encouraged to walk for endurance and reminded to  get a flu shot this  fall.                                   Clinton D. Maple Hudson, MD, [facility], FACP   CDY/MedQ  DD:  01/07/2006  DT:  01/07/2006  Job #:  [id number]   cc:   Meghan Meigs, MD  [facility] Salvia Eliott Nine, MD

## 2010-10-03 NOTE — Discharge Summary (Signed)
[place]  Patient:    Meghan Castro, Meghan Castro Visit Number: [id number] MRN: [id number]          Service Type: MED Location: [phone] Attending Physician:  Veneda Melter Dictated by:   Lavella Hammock, P.A.-C. Admit Date:  05/03/2001 Disc. Date: 05/05/01   CC:         Aram Beecham B. Eliott Nine, M.D.  Colon Flattery, D.O.  barker   Referring Physician Discharge [facility]  DATE OF BIRTH:  [birth year]  PROCEDURES: 1. Cardiac catheterization. 2. Coronary arteriogram. 3. Left ventriculogram.  HOSPITAL COURSE:  Meghan Castro is a 77 year old female with no known history of coronary artery disease, who had onset of chest pain as she was at work doing moderately strenuous activity.  She also complained of food being stuck in her throat.  She was seen by her family physician and sent to the emergency room. She was admitted there by cardiology for further evaluation.  Her enzymes were negative for MI but because of her risk factors for coronary artery disease and the quality of her pain, she was scheduled for a cardiac catheterization which was done on 05/05/01.  The cardiac catheterization showed a normal left main and LAD and a circumflex with a less than 20% stenosis and an RCA that was normal.  Her EF was normal at 71% with no MR.  It was felt that this was not cardiac pain.  Additionally, she had significant hypokalemia with a potassium of 2.9.  The patient, prior to admission, was taking Lasix at 40 mg q.d. for nephrotic syndrome.  The patient stated at one point, she had been on 120 mg a day, but this had been adjusted over time, and that she had never been on a daily potassium supplementation, although she had occasionally required supplements. She was given a total of 120 mEq of potassium which brought her potassium up to 4.2,and she was placed on a daily dose of 20 mEq a day.  Pending completion of bed rest and ambulation with her groin remaining  stable, the patient is considered stable for discharge on 05/05/01.  CHEST X-RAY:  Pulmonary venous hypertension without frank edema and a large hiatal hernia.  LABORATORY VALUES:  Hemoglobin is 11.8, hematocrit 34.9, WBC 3.9, platelets 230.  Sodium 141, potassium 4.2, chloride 110, CO2 25, BUN 8, creatinine 0.7, glucose 98.  Serial CK-MB and troponin negative for MI.  Total cholesterol 165, triglycerides 140, HDL 37, LDL 100.  DISCHARGE CONDITION:  Improved.  CONSULTS:  None.  COMPLICATIONS:  None.  DISCHARGE DIAGNOSES: 1. Chest pain with exertion, no significant coronary artery disease by    catheterization. 2. History of cerebrovascular accident in 1997. 3. History of asthma with early chronic obstructive pulmonary disease. 4. History of nephrotic syndrome. 5. History of ALLERGY to PENICILLIN. 6. Hypokalemia. 7. Family history of coronary artery disease. 8. Remote history of tobacco. 9. History of dyslipidemia with a low high-density lipoprotein.  DISCHARGE INSTRUCTIONS: 1. Her activity level was to include no driving, sexual or strenuous activity    for two days. 2. She is to stick to a low fat diet. 3. She is to call the office for problems with the cath site. 4. She is to continue to remain off tobacco. 5. She is to see Dr. Dewaine Conger within two weeks and get a BMET at the office    visit to check her potassium. 6. She is to see Dr. Eliott Nine and call for an appointment.  DISCHARGE MEDICATIONS: 1. Coated aspirin 325 mg q.d. 2. Lasix 40 mg q.d. 3. Vitamin C 1000 mg q.d. 4. Vitamin E 800 units q.d. 5. Ginger root q.d. 6. K-Dur 20 mEq q.d. Dictated by:   Lavella Hammock, P.A.-C. Attending Physician:  Veneda Melter DD:  05/05/01 TD:  05/05/01 Job: [id number]

## 2010-10-03 NOTE — Assessment & Plan Note (Signed)
[facility]                             PULMONARY OFFICE NOTE   Meghan Castro, Meghan Castro                          MRN:          [id number]  DATE:07/08/2006                            DOB:          Nov 20, 1928    PROBLEMS:  1. Chronic obstructive pulmonary disease.  2. Chronic sinusitis.  3. Mediastinal adenopathy.  4. Renal insufficiency, question nephrotic syndrome.  5. Hypertension.  6. Hiatal hernia.   HISTORY:  She says this has been a good winter with no flu or  respiratory infections.  Still rarely needs albuterol.  She tried  Nasacort but it made her nose sore, and she wants to change back to  Nasonex.  Her chest has been okay with no cough or phlegm, no chest pain  or palpitation.  She complains mainly that her eyes and nose water with  limited sneezing.   MEDICATIONS:  1. Spiriva.  2. Furosemide 40 mg.  3. Potassium.  4. Ecotrin.  5. Ginger root.  6. Vitamin C.  7. Multivitamins.  8. Micardis 40 mg.  9. Caltrate 600 mg plus vitamin D.  10.P.r.n. use of a rescue albuterol inhaler and Nasonex.   ALLERGIES:  DRUG INTOLERANT TO PENICILLIN.   OBJECTIVE:  Weight 174 pounds, BP 128/82, pulse regular 67, room air  saturation 97%.  Her chest is clear and quiet.  Heart sounds are regular without murmur.  There is no edema.  Her nose is wet but not really congested.   IMPRESSION:  1. Asthma/chronic obstructive pulmonary disease, stable.  2. Allergic rhinitis.   PLAN:  1. Environmental precautions and realistic expectations of available      treatments were reviewed.  2. We have prescribed to switch her back over to Nasonex at her      request, 1 spray each nostril daily.  3. Chest x-ray.  4. Schedule return in 4 months, earlier p.r.n.     Clinton D. Maple Hudson, MD, [facility], FACP  Electronically Signed    CDY/MedQ  DD: 07/09/2006  DT: 07/10/2006  Job #: [id number]   cc:   [name], M.D.  [facility] Sal[facility] Eliott Nine, M.D.

## 2011-01-01 ENCOUNTER — Encounter (INDEPENDENT_AMBULATORY_CARE_PROVIDER_SITE_OTHER): Payer: Medicare Other | Admitting: Ophthalmology

## 2011-01-01 DIAGNOSIS — H43819 Vitreous degeneration, unspecified eye: Secondary | ICD-10-CM

## 2011-01-01 DIAGNOSIS — H353290 Exudative age-related macular degeneration, unspecified eye, stage unspecified: Secondary | ICD-10-CM

## 2011-01-01 DIAGNOSIS — H3530 Unspecified macular degeneration: Secondary | ICD-10-CM

## 2011-01-28 ENCOUNTER — Encounter: Payer: Self-pay | Admitting: Internal Medicine

## 2011-01-28 ENCOUNTER — Ambulatory Visit (INDEPENDENT_AMBULATORY_CARE_PROVIDER_SITE_OTHER): Payer: Medicare Other | Admitting: Internal Medicine

## 2011-01-28 VITALS — BP 130/82 | HR 81 | Ht 64.0 in | Wt 179.4 lb

## 2011-01-28 DIAGNOSIS — J310 Chronic rhinitis: Secondary | ICD-10-CM

## 2011-01-28 DIAGNOSIS — J449 Chronic obstructive pulmonary disease, unspecified: Secondary | ICD-10-CM

## 2011-01-28 DIAGNOSIS — Z23 Encounter for immunization: Secondary | ICD-10-CM

## 2011-01-28 NOTE — Progress Notes (Signed)
  Subjective:    Patient ID: Meghan Castro, female    DOB: August 19, 1928, 77 y.o.   MRN: [id number]  HPI 01/28/11- 49 yoF former smoker followed for COPD, hx chronic sinusitis, complicated by large hiatal hernia, macular degeneration, HBP, renal insufficiency. Last here- March 12. 2012 Reports good summer with no new problems Notes runny nose and sneeze in last 2 weeks when she goes outside.  Rescue inhaler< 1/ week. Uses Spiriva regularly.   .Review of Systems Constitutional:   No-   weight loss, night sweats, fevers, chills, fatigue, lassitude. HEENT:   No-  headaches, difficulty swallowing, tooth/dental problems, sore throat,       No-  sneezing, itching, ear ache,         nasal congestion, post nasal drip,  CV:  No-   chest pain, orthopnea, PND, swelling in lower extremities, anasarca, dizziness, palpitations Resp: No-   shortness of breath with exertion or at rest.              No-   productive cough,  No non-productive cough,  No-  coughing up of blood.              No-   change in color of mucus.  No- wheezing.   Skin: No-   rash or lesions. GI:  No-   heartburn, indigestion, abdominal pain, nausea, vomiting, diarrhea,                 change in bowel habits, loss of appetite GU: No-   dysuria, change in color of urine, no urgency or frequency.  No- flank pain. MS:  No-   joint pain or swelling.  No- decreased range of motion.  No- back pain. Neuro- grossly normal to observation, Or:  Psych:  No- change in mood or affect. No depression or anxiety.  No memory loss.    Objective:   Physical Exam General- Alert, Oriented, Affect-appropriate, Distress- none acute Skin- rash-none, lesions- none, excoriation- none Lymphadenopathy- none Head- atraumatic            Eyes- Gross vision limited , PERRLA, conjunctivae clear secretions            Ears- Hearing, canals- normal            Nose- Clear, No-Septal dev, mucus, polyps, erosion, perforation             Throat- Mallampati II , mucosa  clear , drainage- none, tonsils- atrophic Neck- flexible , trachea midline, no stridor , thyroid nl, carotid no bruit Chest - symmetrical excursion , unlabored           Heart/CV- RRR , no murmur , no gallop  , no rub, nl s1 s2                           - JVD- none , edema- none, stasis changes- none, varices- none           Lung- clear to P&A, wheeze- none, cough- none , dullness-none, rub- none           Chest wall-  Abd- tender-no, distended-no, bowel sounds-present, HSM- no Br/ Gen/ Rectal- Not done, not indicated Extrem- cyanosis- none, clubbing, none, atrophy- none, strength- nl Neuro- grossly intact to observation         Assessment & Plan:

## 2011-01-28 NOTE — Patient Instructions (Signed)
Flu vax  Suggest Allegra/ fexofenadine as a non-prescription antihistamine for allergy when needed.

## 2011-01-28 NOTE — Assessment & Plan Note (Addendum)
Spiriva generally sufficient. Meds reviewed.  Dyspnea with exertion partly due to age and deconditioning, but her activity is progressively less as eye sight deteriorates.

## 2011-01-28 NOTE — Assessment & Plan Note (Signed)
Manages by carrying a kleenex. We agreed this is better than being overdried.

## 2011-03-12 ENCOUNTER — Encounter (INDEPENDENT_AMBULATORY_CARE_PROVIDER_SITE_OTHER): Payer: Medicare Other | Admitting: Ophthalmology

## 2011-03-12 DIAGNOSIS — H3530 Unspecified macular degeneration: Secondary | ICD-10-CM

## 2011-03-12 DIAGNOSIS — H2510 Age-related nuclear cataract, unspecified eye: Secondary | ICD-10-CM

## 2011-03-12 DIAGNOSIS — H353290 Exudative age-related macular degeneration, unspecified eye, stage unspecified: Secondary | ICD-10-CM

## 2011-03-12 DIAGNOSIS — H43819 Vitreous degeneration, unspecified eye: Secondary | ICD-10-CM

## 2011-05-13 ENCOUNTER — Other Ambulatory Visit: Payer: Self-pay | Admitting: Internal Medicine

## 2011-05-28 ENCOUNTER — Encounter (INDEPENDENT_AMBULATORY_CARE_PROVIDER_SITE_OTHER): Payer: Medicare Other | Admitting: Ophthalmology

## 2011-05-28 DIAGNOSIS — H353290 Exudative age-related macular degeneration, unspecified eye, stage unspecified: Secondary | ICD-10-CM

## 2011-05-28 DIAGNOSIS — H43819 Vitreous degeneration, unspecified eye: Secondary | ICD-10-CM

## 2011-05-28 DIAGNOSIS — H3530 Unspecified macular degeneration: Secondary | ICD-10-CM

## 2011-05-28 DIAGNOSIS — H2510 Age-related nuclear cataract, unspecified eye: Secondary | ICD-10-CM

## 2011-07-30 ENCOUNTER — Encounter: Payer: Self-pay | Admitting: Internal Medicine

## 2011-07-30 ENCOUNTER — Ambulatory Visit (INDEPENDENT_AMBULATORY_CARE_PROVIDER_SITE_OTHER): Payer: Medicare Other | Admitting: Internal Medicine

## 2011-07-30 ENCOUNTER — Ambulatory Visit (INDEPENDENT_AMBULATORY_CARE_PROVIDER_SITE_OTHER)
Admission: RE | Admit: 2011-07-30 | Discharge: 2011-07-30 | Disposition: A | Discharge status: Home / Self Care | Payer: Medicare Other | Source: Ambulatory Visit | Attending: Internal Medicine | Admitting: Internal Medicine

## 2011-07-30 VITALS — BP 112/70 | HR 75 | Ht 64.0 in | Wt 173.0 lb

## 2011-07-30 DIAGNOSIS — J310 Chronic rhinitis: Secondary | ICD-10-CM

## 2011-07-30 DIAGNOSIS — J449 Chronic obstructive pulmonary disease, unspecified: Secondary | ICD-10-CM

## 2011-07-30 NOTE — Progress Notes (Signed)
Patient ID: Meghan Castro, female    DOB: 09/29/1928, 78 y.o.   MRN: [id number]  HPI 01/28/11- 32 yoF former smoker followed for COPD, hx chronic sinusitis, complicated by large hiatal hernia, macular degeneration, HBP, renal insufficiency. Last here- March 12. 2012 Reports good summer with no new problems Notes runny nose and sneeze in last 2 weeks when she goes outside.  Rescue inhaler< 1/ week. Uses Spiriva regularly.   07/30/11- 82 yoF former smoker followed for COPD, hx chronic sinusitis, complicated by large hiatal hernia, macular degeneration, HBP, renal insufficiency. When she is outdoors she notices more need to blow her nose with watery rhinorrhea. This seems to be perennial with little itch or sneeze. Has not had a bad cold this winter. Not aware of significant shortness of breath with exertion, or cough. Denies reflux but is aware she has a large hiatal hernia. She was upset today. Son recently diagnosed with lung cancer and on chemotherapy.  Review of Systems Constitutional:   No-   weight loss, night sweats, fevers, chills, fatigue, lassitude. HEENT:   No-  headaches, difficulty swallowing, tooth/dental problems, sore throat,       No-  sneezing, itching, ear ache,         +nasal congestion, post nasal drip,  CV:  No-   chest pain, orthopnea, PND, swelling in lower extremities, anasarca, dizziness, palpitations Resp: No-   shortness of breath with exertion or at rest.              No-   productive cough,  No non-productive cough,  No-  coughing up of blood.              No-   change in color of mucus.  No- wheezing.   Skin: No-   rash or lesions. GI:  No-   heartburn, indigestion, abdominal pain, nausea, vomiting,  GU:  MS:  No-   joint pain or swelling.   Neuro- grossly normal to observation, Or:  Psych:  No- change in mood or affect.  + depression or anxiety.  No memory loss.    Objective:   Physical Exam General- Alert, Oriented, Affect-tearfull, Distress- none  acute Skin- rash-none, lesions- none, excoriation- none Lymphadenopathy- none Head- atraumatic            Eyes- Gross vision limited , PERRLA, conjunctivae clear secretions            Ears- Hearing, canals- normal            Nose- Clear, No-Septal dev, mucus, polyps, erosion, perforation             Throat- Mallampati II , mucosa clear , drainage- none, tonsils- atrophic Neck- flexible , trachea midline, no stridor , thyroid nl, carotid no bruit Chest - symmetrical excursion , unlabored           Heart/CV- RRR , no murmur , no gallop  , no rub, nl s1 s2                           - JVD- none , edema- none, stasis changes- none, varices- none           Lung- clear to P&A/ diminished, wheeze- none, cough- none , dullness-none, rub- none           Chest wall-  Abd-  Br/ Gen/ Rectal- Not done, not indicated Extrem- cyanosis- none, clubbing, none, atrophy- none, strength- nl Neuro-  grossly intact to observation

## 2011-07-30 NOTE — Patient Instructions (Signed)
Order- CXR   Dx COPD  Ok to take an antihistamine as needed for runny nose. If that isn't enough, please let us know.

## 2011-08-02 NOTE — Assessment & Plan Note (Addendum)
Controlled with no obvious progression recently. She avoided significant winter virus infections. Plan-chest x-ray. She is frightened by her son's diagnosis of cancer.

## 2011-08-02 NOTE — Assessment & Plan Note (Signed)
Nonspecific perennial rhinitis.

## 2011-08-12 NOTE — Progress Notes (Signed)
Quick Note:  Spoke with pt and notified of results per Dr. Young. Pt verbalized understanding and denied any questions.  ______ 

## 2011-08-13 ENCOUNTER — Encounter (INDEPENDENT_AMBULATORY_CARE_PROVIDER_SITE_OTHER): Payer: Medicare Other | Admitting: Ophthalmology

## 2011-08-13 DIAGNOSIS — H43819 Vitreous degeneration, unspecified eye: Secondary | ICD-10-CM

## 2011-08-13 DIAGNOSIS — H353290 Exudative age-related macular degeneration, unspecified eye, stage unspecified: Secondary | ICD-10-CM

## 2011-08-13 DIAGNOSIS — H2510 Age-related nuclear cataract, unspecified eye: Secondary | ICD-10-CM

## 2011-08-13 DIAGNOSIS — H3530 Unspecified macular degeneration: Secondary | ICD-10-CM

## 2011-11-05 ENCOUNTER — Encounter (INDEPENDENT_AMBULATORY_CARE_PROVIDER_SITE_OTHER): Payer: Medicare Other | Admitting: Ophthalmology

## 2011-11-05 DIAGNOSIS — H43819 Vitreous degeneration, unspecified eye: Secondary | ICD-10-CM

## 2011-11-05 DIAGNOSIS — H353290 Exudative age-related macular degeneration, unspecified eye, stage unspecified: Secondary | ICD-10-CM

## 2011-11-05 DIAGNOSIS — H3530 Unspecified macular degeneration: Secondary | ICD-10-CM

## 2011-11-05 DIAGNOSIS — H2510 Age-related nuclear cataract, unspecified eye: Secondary | ICD-10-CM

## 2012-01-28 ENCOUNTER — Encounter (INDEPENDENT_AMBULATORY_CARE_PROVIDER_SITE_OTHER): Payer: Medicare Other | Admitting: Ophthalmology

## 2012-01-28 DIAGNOSIS — H43819 Vitreous degeneration, unspecified eye: Secondary | ICD-10-CM

## 2012-01-28 DIAGNOSIS — H353290 Exudative age-related macular degeneration, unspecified eye, stage unspecified: Secondary | ICD-10-CM

## 2012-01-28 DIAGNOSIS — H3530 Unspecified macular degeneration: Secondary | ICD-10-CM

## 2012-02-01 ENCOUNTER — Encounter: Payer: Self-pay | Admitting: Internal Medicine

## 2012-02-01 ENCOUNTER — Ambulatory Visit (INDEPENDENT_AMBULATORY_CARE_PROVIDER_SITE_OTHER)
Admission: RE | Admit: 2012-02-01 | Discharge: 2012-02-01 | Disposition: A | Discharge status: Home / Self Care | Payer: Medicare Other | Source: Ambulatory Visit | Attending: Internal Medicine | Admitting: Internal Medicine

## 2012-02-01 ENCOUNTER — Ambulatory Visit (INDEPENDENT_AMBULATORY_CARE_PROVIDER_SITE_OTHER): Payer: Medicare Other | Admitting: Internal Medicine

## 2012-02-01 VITALS — BP 128/82 | HR 91 | Ht 64.0 in | Wt 178.0 lb

## 2012-02-01 DIAGNOSIS — Z23 Encounter for immunization: Secondary | ICD-10-CM

## 2012-02-01 DIAGNOSIS — J449 Chronic obstructive pulmonary disease, unspecified: Secondary | ICD-10-CM

## 2012-02-01 MED ORDER — TIOTROPIUM BROMIDE MONOHYDRATE 18 MCG IN CAPS: 18.0000 ug | ORAL_CAPSULE | Freq: Every day | RESPIRATORY_TRACT | Status: DC

## 2012-02-01 NOTE — Progress Notes (Signed)
Patient ID: Meghan Castro, female    DOB: 1929/03/24, 78 y.o.   MRN: [id number]  HPI 01/28/11- 78 yoF former smoker followed for COPD, hx chronic sinusitis, complicated by large hiatal hernia, macular degeneration, HBP, renal insufficiency. Last here- March 12. 2012 Reports good summer with no new problems Notes runny nose and sneeze in last 2 weeks when she goes outside.  Rescue inhaler< 1/ week. Uses Spiriva regularly.   07/30/11- 78 yoF former smoker followed for COPD, hx chronic sinusitis, complicated by large hiatal hernia, macular degeneration, HBP, renal insufficiency. When she is outdoors she notices more need to blow her nose with watery rhinorrhea. This seems to be perennial with little itch or sneeze. Has not had a bad cold this winter. Not aware of significant shortness of breath with exertion, or cough. Denies reflux but is aware she has a large hiatal hernia. She was upset today. Son recently diagnosed with lung cancer and on chemotherapy.  02/01/12- 78 yoF former smoker followed for COPD, hx chronic sinusitis, complicated by large hiatal hernia, macular degeneration, HBP, renal insufficiency.   Son has lung cancer/ chemo/ XRT.Marland Kitchen   Slight wheezing-? allergies causing this. COPD assessment test (CAT) 16/40 CXR 08/12/15-reviewed IMPRESSION:  Stable hyperinflation and chronic interstitial prominence. Hazy  right basilar atelectasis or early infiltrate. Large hiatal hernia  again noted.    Review of Systems-see HPI Constitutional:   No-   weight loss, night sweats, fevers, chills, fatigue, lassitude. HEENT:   No-  headaches, difficulty swallowing, tooth/dental problems, sore throat,       No-  sneezing, itching, ear ache,         +nasal congestion, post nasal drip,  CV:  No-   chest pain, orthopnea, PND, swelling in lower extremities, anasarca, dizziness, palpitations Resp: No-   shortness of breath with exertion or at rest.              No-   productive cough,  No non-productive  cough,  No-  coughing up of blood.              No-   change in color of mucus.  + wheezing.   Skin: No-   rash or lesions. GI:  No-   heartburn, indigestion, abdominal pain, nausea, vomiting,  GU:  MS:  No-   joint pain or swelling.   Neuro- nothing unusual Psych:  No- change in mood or affect.  + depression or anxiety.  No memory loss.    Objective:   Physical Exam General- Alert, Oriented, Affect-tearfull, Distress- none acute Skin- rash-none, lesions- none, excoriation- none Lymphadenopathy- none Head- atraumatic            Eyes- Gross vision limited , PERRLA, conjunctivae clear secretions            Ears- Hearing, canals- normal            Nose- Clear, No-Septal dev, mucus, polyps, erosion, perforation             Throat- Mallampati II , mucosa clear , drainage- none, tonsils- atrophic Neck- flexible , trachea midline, no stridor , thyroid nl, carotid no bruit Chest - symmetrical excursion , unlabored           Heart/CV- RRR , no murmur , no gallop  , no rub, nl s1 s2                           -  JVD- none , edema- none, stasis changes- none, varices- none           Lung- clear to P&A/ diminished, wheeze- none, cough- none , dullness-none, rub- none           Chest wall-  Abd-  Br/ Gen/ Rectal- Not done, not indicated Extrem- cyanosis- none, clubbing, none, atrophy- none, strength- nl Neuro- grossly intact to observation

## 2012-02-01 NOTE — Patient Instructions (Addendum)
Order- CXR  Dx COPD  Flu vax  Sample Spiriva

## 2012-02-03 NOTE — Progress Notes (Signed)
Quick Note:  Pt aware of results. ______ 

## 2012-02-09 NOTE — Assessment & Plan Note (Signed)
Adequately controlled for now. Plan-sample Spiriva, chest x-ray, flu vaccine

## 2012-04-21 ENCOUNTER — Encounter (INDEPENDENT_AMBULATORY_CARE_PROVIDER_SITE_OTHER): Payer: Medicare Other | Admitting: Ophthalmology

## 2012-04-21 DIAGNOSIS — H35039 Hypertensive retinopathy, unspecified eye: Secondary | ICD-10-CM

## 2012-04-21 DIAGNOSIS — H43819 Vitreous degeneration, unspecified eye: Secondary | ICD-10-CM

## 2012-04-21 DIAGNOSIS — I10 Essential (primary) hypertension: Secondary | ICD-10-CM

## 2012-04-21 DIAGNOSIS — H353290 Exudative age-related macular degeneration, unspecified eye, stage unspecified: Secondary | ICD-10-CM

## 2012-04-21 DIAGNOSIS — H3530 Unspecified macular degeneration: Secondary | ICD-10-CM

## 2012-06-21 ENCOUNTER — Other Ambulatory Visit: Payer: Self-pay | Admitting: Internal Medicine

## 2012-06-21 MED ORDER — TIOTROPIUM BROMIDE MONOHYDRATE 18 MCG IN CAPS: 18.0000 ug | ORAL_CAPSULE | Freq: Every day | RESPIRATORY_TRACT | Status: DC

## 2012-07-21 ENCOUNTER — Encounter (INDEPENDENT_AMBULATORY_CARE_PROVIDER_SITE_OTHER): Payer: Medicare Other | Admitting: Ophthalmology

## 2012-07-21 DIAGNOSIS — H35059 Retinal neovascularization, unspecified, unspecified eye: Secondary | ICD-10-CM

## 2012-07-21 DIAGNOSIS — H35039 Hypertensive retinopathy, unspecified eye: Secondary | ICD-10-CM

## 2012-07-21 DIAGNOSIS — H43819 Vitreous degeneration, unspecified eye: Secondary | ICD-10-CM

## 2012-08-01 ENCOUNTER — Ambulatory Visit: Payer: Medicare Other | Admitting: Internal Medicine

## 2012-08-03 ENCOUNTER — Encounter (INDEPENDENT_AMBULATORY_CARE_PROVIDER_SITE_OTHER): Payer: Medicare Other | Admitting: Ophthalmology

## 2012-08-03 DIAGNOSIS — H353290 Exudative age-related macular degeneration, unspecified eye, stage unspecified: Secondary | ICD-10-CM

## 2012-08-03 DIAGNOSIS — H43819 Vitreous degeneration, unspecified eye: Secondary | ICD-10-CM

## 2012-08-30 ENCOUNTER — Other Ambulatory Visit: Payer: Self-pay | Admitting: Internal Medicine

## 2012-09-05 ENCOUNTER — Telehealth: Payer: Self-pay | Admitting: Internal Medicine

## 2012-09-05 NOTE — Telephone Encounter (Signed)
ATC pt line busy x 3 wcb 

## 2012-09-06 MED ORDER — TIOTROPIUM BROMIDE MONOHYDRATE 18 MCG IN CAPS: 18.0000 ug | ORAL_CAPSULE | Freq: Every day | RESPIRATORY_TRACT | Status: DC

## 2012-09-06 NOTE — Telephone Encounter (Signed)
Pt returned call. Meghan Castro °

## 2012-09-06 NOTE — Telephone Encounter (Signed)
ATC patient, no answer [id number] 

## 2012-09-06 NOTE — Telephone Encounter (Signed)
Refill sent. Pt was upset that she has been trying to get this medication refilled for a week now. I apologized for this. Nothing further is needed at this time. Carron Curie, CMA

## 2012-09-07 NOTE — Telephone Encounter (Signed)
Rx sent per phone msg on 09/05/12

## 2012-09-12 ENCOUNTER — Ambulatory Visit (INDEPENDENT_AMBULATORY_CARE_PROVIDER_SITE_OTHER): Payer: Medicare Other | Admitting: Internal Medicine

## 2012-09-12 ENCOUNTER — Encounter: Payer: Self-pay | Admitting: Internal Medicine

## 2012-09-12 VITALS — BP 124/80 | HR 83 | Ht 62.0 in | Wt 173.6 lb

## 2012-09-12 DIAGNOSIS — J309 Allergic rhinitis, unspecified: Secondary | ICD-10-CM

## 2012-09-12 DIAGNOSIS — J302 Other seasonal allergic rhinitis: Secondary | ICD-10-CM

## 2012-09-12 DIAGNOSIS — J449 Chronic obstructive pulmonary disease, unspecified: Secondary | ICD-10-CM

## 2012-09-12 MED ORDER — TIOTROPIUM BROMIDE MONOHYDRATE 18 MCG IN CAPS: 18.0000 ug | ORAL_CAPSULE | Freq: Every day | RESPIRATORY_TRACT | Status: DC

## 2012-09-12 MED ORDER — AZELASTINE-FLUTICASONE 137-50 MCG/ACT NA SUSP: 2.0000 | Freq: Every day | NASAL | Status: DC

## 2012-09-12 MED ORDER — ALBUTEROL SULFATE HFA 108 (90 BASE) MCG/ACT IN AERS: 2.0000 | INHALATION_SPRAY | RESPIRATORY_TRACT | Status: DC | PRN

## 2012-09-12 NOTE — Progress Notes (Signed)
Patient ID: Meghan Castro, female    DOB: May 26, 1928, 79 y.o.   MRN: [id number]  HPI 01/28/11- 20 yoF former smoker followed for COPD, hx chronic sinusitis, complicated by large hiatal hernia, macular degeneration, HBP, renal insufficiency. Last here- March 12. 2012 Reports good summer with no new problems Notes runny nose and sneeze in last 2 weeks when she goes outside.  Rescue inhaler< 1/ week. Uses Spiriva regularly.   07/30/11- 82 yoF former smoker followed for COPD, hx chronic sinusitis, complicated by large hiatal hernia, macular degeneration, HBP, renal insufficiency. When she is outdoors she notices more need to blow her nose with watery rhinorrhea. This seems to be perennial with little itch or sneeze. Has not had a bad cold this winter. Not aware of significant shortness of breath with exertion, or cough. Denies reflux but is aware she has a large hiatal hernia. She was upset today. Son recently diagnosed with lung cancer and on chemotherapy.  02/01/12- 89 yoF former smoker followed for COPD, hx chronic sinusitis, complicated by large hiatal hernia, macular degeneration, HBP, renal insufficiency.   Son has lung cancer/ chemo/ XRT.Marland Kitchen   Slight wheezing-? allergies causing this. COPD assessment test (CAT) 16/40 CXR 08/12/15-reviewed IMPRESSION:  Stable hyperinflation and chronic interstitial prominence. Hazy  right basilar atelectasis or early infiltrate. Large hiatal hernia  again noted.   08/2812- 15 yoF former smoker followed for COPD, hx chronic sinusitis, complicated by large hiatal hernia, macular degeneration, HBP, renal insufficiency.   Son has lung cancer/ chemo/ XRT.. FOLLOWS FOR: SOB (? due to pollen and allergy flare up), occasionally coughs, denies any wheezing. Sneezing, chest congestion but little cough. Dr Lysbeth Galas gave Qnasl but prefers flonase. Advair not much help. CXR 02/03/12 IMPRESSION:  Coarse interstitial markings, chronic lung base opacities, and  hyperinflation,  similar to prior.  Moderate to large hiatal hernia.  Original Report Authenticated By: Waneta Martins, M.D.  Review of Systems-see HPI Constitutional:   No-   weight loss, night sweats, fevers, chills, fatigue, lassitude. HEENT:   No-  headaches, difficulty swallowing, tooth/dental problems, sore throat,       + sneezing, itching, no-ear ache,+nasal congestion, post nasal drip,  CV:  No-   chest pain, orthopnea, PND, swelling in lower extremities, anasarca, dizziness, palpitations Resp: + shortness of breath with exertion or at rest.              No-   productive cough,  + non-productive cough,  No-  coughing up of blood.              No-   change in color of mucus.  +little wheezing.   Skin: No-   rash or lesions. GI:  No-   heartburn, indigestion, abdominal pain, nausea, vomiting,  GU:  MS:  No-   joint pain or swelling.   Neuro- nothing unusual Psych:  No- change in mood or affect.  + depression or anxiety.  No memory loss.    Objective:   Physical Exam General- Alert, Oriented, Affect-tearfull, Distress- none acute Skin- rash-none, lesions- none, excoriation- none Lymphadenopathy- none Head- atraumatic            Eyes- Gross vision limited , PERRLA, conjunctivae clear secretions            Ears- Hearing, canals- normal            Nose- , No-Septal dev, +mucus bridging, polyps, erosion, perforation  Throat- Mallampati II , mucosa clear , drainage- none, tonsils- atrophic Neck- flexible , trachea midline, no stridor , thyroid nl, carotid no bruit Chest - symmetrical excursion , unlabored           Heart/CV- RRR , no murmur , no gallop  , no rub, nl s1 s2                           - JVD- none , edema- none, stasis changes- none, varices- none           Lung- +coarse breath sounds without wheeze, wheeze- none, cough- none , dullness-none, rub- none           Chest wall-  Abd-  Br/ Gen/ Rectal- Not done, not indicated Extrem- cyanosis- none, clubbing, none, atrophy-  none, strength- nl Neuro- grossly intact to observation

## 2012-09-12 NOTE — Patient Instructions (Addendum)
Sample Spiriva  1 daily  Sample Dymista nasal spray    1-2 puff each nostril, once daily at bedtime. Try this instead of flonase/ fluticasone or Qnasl  Refill script for rescue inhaler sent

## 2012-09-20 ENCOUNTER — Encounter: Payer: Self-pay | Admitting: Internal Medicine

## 2012-09-20 NOTE — Assessment & Plan Note (Signed)
Mild exacerbation consistent with pollen season. Plan-okay to continue Spiriva

## 2012-09-20 NOTE — Assessment & Plan Note (Signed)
Discussed pollen allergy and expectations. Plan-sample Dymista nasal spray

## 2012-09-28 ENCOUNTER — Encounter (INDEPENDENT_AMBULATORY_CARE_PROVIDER_SITE_OTHER): Payer: Medicare Other | Admitting: Ophthalmology

## 2012-09-28 DIAGNOSIS — H43819 Vitreous degeneration, unspecified eye: Secondary | ICD-10-CM

## 2012-09-28 DIAGNOSIS — I10 Essential (primary) hypertension: Secondary | ICD-10-CM

## 2012-09-28 DIAGNOSIS — H35039 Hypertensive retinopathy, unspecified eye: Secondary | ICD-10-CM

## 2012-09-28 DIAGNOSIS — H3530 Unspecified macular degeneration: Secondary | ICD-10-CM

## 2012-09-28 DIAGNOSIS — H353290 Exudative age-related macular degeneration, unspecified eye, stage unspecified: Secondary | ICD-10-CM

## 2012-11-23 ENCOUNTER — Encounter (INDEPENDENT_AMBULATORY_CARE_PROVIDER_SITE_OTHER): Payer: Self-pay | Admitting: Ophthalmology

## 2012-11-23 DIAGNOSIS — H3530 Unspecified macular degeneration: Secondary | ICD-10-CM

## 2012-11-23 DIAGNOSIS — H35039 Hypertensive retinopathy, unspecified eye: Secondary | ICD-10-CM

## 2012-11-23 DIAGNOSIS — I10 Essential (primary) hypertension: Secondary | ICD-10-CM

## 2012-11-23 DIAGNOSIS — H43819 Vitreous degeneration, unspecified eye: Secondary | ICD-10-CM

## 2012-11-23 DIAGNOSIS — H353290 Exudative age-related macular degeneration, unspecified eye, stage unspecified: Secondary | ICD-10-CM

## 2013-01-18 ENCOUNTER — Encounter (INDEPENDENT_AMBULATORY_CARE_PROVIDER_SITE_OTHER): Payer: Medicare Other | Admitting: Ophthalmology

## 2013-01-18 DIAGNOSIS — H43819 Vitreous degeneration, unspecified eye: Secondary | ICD-10-CM

## 2013-01-18 DIAGNOSIS — H3530 Unspecified macular degeneration: Secondary | ICD-10-CM

## 2013-01-18 DIAGNOSIS — H35039 Hypertensive retinopathy, unspecified eye: Secondary | ICD-10-CM

## 2013-01-18 DIAGNOSIS — I10 Essential (primary) hypertension: Secondary | ICD-10-CM

## 2013-01-18 DIAGNOSIS — H353290 Exudative age-related macular degeneration, unspecified eye, stage unspecified: Secondary | ICD-10-CM

## 2013-03-08 ENCOUNTER — Encounter (INDEPENDENT_AMBULATORY_CARE_PROVIDER_SITE_OTHER): Payer: Medicare Other | Admitting: Ophthalmology

## 2013-03-08 DIAGNOSIS — I10 Essential (primary) hypertension: Secondary | ICD-10-CM

## 2013-03-08 DIAGNOSIS — H3530 Unspecified macular degeneration: Secondary | ICD-10-CM

## 2013-03-08 DIAGNOSIS — H35039 Hypertensive retinopathy, unspecified eye: Secondary | ICD-10-CM

## 2013-03-08 DIAGNOSIS — H43819 Vitreous degeneration, unspecified eye: Secondary | ICD-10-CM

## 2013-03-08 DIAGNOSIS — H353290 Exudative age-related macular degeneration, unspecified eye, stage unspecified: Secondary | ICD-10-CM

## 2013-03-16 ENCOUNTER — Ambulatory Visit (INDEPENDENT_AMBULATORY_CARE_PROVIDER_SITE_OTHER): Payer: Medicare Other | Admitting: Internal Medicine

## 2013-03-16 ENCOUNTER — Encounter: Payer: Self-pay | Admitting: Internal Medicine

## 2013-03-16 VITALS — BP 118/78 | HR 76 | Ht 62.0 in | Wt 167.6 lb

## 2013-03-16 DIAGNOSIS — J309 Allergic rhinitis, unspecified: Secondary | ICD-10-CM

## 2013-03-16 DIAGNOSIS — J449 Chronic obstructive pulmonary disease, unspecified: Secondary | ICD-10-CM

## 2013-03-16 DIAGNOSIS — Z23 Encounter for immunization: Secondary | ICD-10-CM

## 2013-03-16 DIAGNOSIS — J302 Other seasonal allergic rhinitis: Secondary | ICD-10-CM

## 2013-03-16 MED ORDER — TIOTROPIUM BROMIDE MONOHYDRATE 18 MCG IN CAPS: 18.0000 ug | ORAL_CAPSULE | Freq: Every day | RESPIRATORY_TRACT | Status: DC

## 2013-03-16 NOTE — Progress Notes (Signed)
Patient ID: Meghan Castro, female    DOB: Aug 20, 1928, 79 y.o.   MRN: [id number]  HPI 01/28/11- 71 yoF former smoker followed for COPD, hx chronic sinusitis, complicated by large hiatal hernia, macular degeneration, HBP, renal insufficiency. Last here- March 12. 2012 Reports good summer with no new problems Notes runny nose and sneeze in last 2 weeks when she goes outside.  Rescue inhaler< 1/ week. Uses Spiriva regularly.   07/30/11- 82 yoF former smoker followed for COPD, hx chronic sinusitis, complicated by large hiatal hernia, macular degeneration, HBP, renal insufficiency. When she is outdoors she notices more need to blow her nose with watery rhinorrhea. This seems to be perennial with little itch or sneeze. Has not had a bad cold this winter. Not aware of significant shortness of breath with exertion, or cough. Denies reflux but is aware she has a large hiatal hernia. She was upset today. Son recently diagnosed with lung cancer and on chemotherapy.  02/01/12- 59 yoF former smoker followed for COPD, hx chronic sinusitis, complicated by large hiatal hernia, macular degeneration, HBP, renal insufficiency.   Son has lung cancer/ chemo/ XRT.Marland Kitchen   Slight wheezing-? allergies causing this. COPD assessment test (CAT) 16/40 CXR 08/12/15-reviewed IMPRESSION:  Stable hyperinflation and chronic interstitial prominence. Hazy  right basilar atelectasis or early infiltrate. Large hiatal hernia  again noted.   08/2812- 26 yoF former smoker followed for COPD, hx chronic sinusitis, complicated by large hiatal hernia, macular degeneration, HBP, renal insufficiency.   Son has lung cancer/ chemo/ XRT.. FOLLOWS FOR: SOB (? due to pollen and allergy flare up), occasionally coughs, denies any wheezing. Sneezing, chest congestion but little cough. Dr Lysbeth Galas gave Qnasl but prefers flonase. Advair not much help. CXR 02/03/12 IMPRESSION:  Coarse interstitial markings, chronic lung base opacities, and  hyperinflation,  similar to prior.  Moderate to large hiatal hernia.  Original Report Authenticated By: Waneta Martins, M.D.  03/16/13- 5 yoF former smoker followed for COPD, hx chronic sinusitis, complicated by large hiatal hernia, macular degeneration, HBP, renal insufficiency.   Son has lung cancer/ chemo/ XRT. Had slipped and wrenched low back- tightness affects her breathing but otherwise doing well. Had shingles right lower abdomen, resolved.  Review of Systems-see HPI Constitutional:   No-   weight loss, night sweats, fevers, chills, fatigue, lassitude. HEENT:   No-  headaches, difficulty swallowing, tooth/dental problems, sore throat,       No- sneezing, itching, no-ear ache, nasal congestion, post nasal drip,  CV:  No-   chest pain, orthopnea, PND, swelling in lower extremities, anasarca, dizziness, palpitations Resp: + shortness of breath with exertion or at rest.              No-   productive cough,   non-productive cough,  No-  coughing up of blood.              No-   change in color of mucus.  +little wheezing.   Skin: No-   rash or lesions. GI:  No-   heartburn, indigestion, abdominal pain, nausea, vomiting,  GU:  MS:  No-   joint pain or swelling.   Neuro- nothing unusual Psych:  No- change in mood or affect.  + depression or anxiety.  No memory loss.    Objective:   Physical Exam General- Alert, Oriented, Affect-calm, Distress- none acute;  Skin- rash-none, lesions- none, excoriation- none Lymphadenopathy- none Head- atraumatic            Eyes- Gross vision  limited , PERRLA, conjunctivae clear secretions            Ears- Hearing, canals- normal            Nose- , No-Septal dev, mucus bridging, polyps, erosion, perforation             Throat- Mallampati II , mucosa clear , drainage- none, tonsils- atrophic Neck- flexible , trachea midline, no stridor , thyroid nl, carotid no bruit Chest - symmetrical excursion , unlabored           Heart/CV- RRR , no murmur , no gallop  , no rub,  nl s1 s2                           - JVD- none , edema- none, stasis changes- none, varices- none           Lung- +crackles, wheeze- none, cough- none , dullness-none, rub- none           Chest wall-  Abd-  Br/ Gen/ Rectal- Not done, not indicated Extrem- cyanosis- none, clubbing, none, atrophy- none, strength- nl Neuro- grossly intact to observation

## 2013-03-16 NOTE — Patient Instructions (Signed)
Flu vax - hi dose   Sample Spiriva  1 daily  Sample Dymista   1-2 puffs each nostril once daily at bedtime

## 2013-04-02 NOTE — Assessment & Plan Note (Signed)
Plan-Dymista nasal spray 

## 2013-04-02 NOTE — Assessment & Plan Note (Signed)
Adequately controlled. Plan-Spiriva, flu vaccine

## 2013-04-26 ENCOUNTER — Encounter (INDEPENDENT_AMBULATORY_CARE_PROVIDER_SITE_OTHER): Payer: Medicare Other | Admitting: Ophthalmology

## 2013-04-26 DIAGNOSIS — H3530 Unspecified macular degeneration: Secondary | ICD-10-CM

## 2013-04-26 DIAGNOSIS — I10 Essential (primary) hypertension: Secondary | ICD-10-CM

## 2013-04-26 DIAGNOSIS — H43819 Vitreous degeneration, unspecified eye: Secondary | ICD-10-CM

## 2013-04-26 DIAGNOSIS — H353290 Exudative age-related macular degeneration, unspecified eye, stage unspecified: Secondary | ICD-10-CM

## 2013-04-26 DIAGNOSIS — H35039 Hypertensive retinopathy, unspecified eye: Secondary | ICD-10-CM

## 2013-06-21 ENCOUNTER — Encounter (INDEPENDENT_AMBULATORY_CARE_PROVIDER_SITE_OTHER): Payer: Medicare HMO | Admitting: Ophthalmology

## 2013-06-21 DIAGNOSIS — H43819 Vitreous degeneration, unspecified eye: Secondary | ICD-10-CM

## 2013-06-21 DIAGNOSIS — I10 Essential (primary) hypertension: Secondary | ICD-10-CM

## 2013-06-21 DIAGNOSIS — H353290 Exudative age-related macular degeneration, unspecified eye, stage unspecified: Secondary | ICD-10-CM

## 2013-06-21 DIAGNOSIS — H3530 Unspecified macular degeneration: Secondary | ICD-10-CM

## 2013-06-21 DIAGNOSIS — H35039 Hypertensive retinopathy, unspecified eye: Secondary | ICD-10-CM

## 2013-08-09 ENCOUNTER — Encounter (INDEPENDENT_AMBULATORY_CARE_PROVIDER_SITE_OTHER): Payer: Medicare HMO | Admitting: Ophthalmology

## 2013-08-09 DIAGNOSIS — H3530 Unspecified macular degeneration: Secondary | ICD-10-CM

## 2013-08-09 DIAGNOSIS — H43819 Vitreous degeneration, unspecified eye: Secondary | ICD-10-CM

## 2013-08-09 DIAGNOSIS — I10 Essential (primary) hypertension: Secondary | ICD-10-CM

## 2013-08-09 DIAGNOSIS — H353290 Exudative age-related macular degeneration, unspecified eye, stage unspecified: Secondary | ICD-10-CM

## 2013-08-09 DIAGNOSIS — H35039 Hypertensive retinopathy, unspecified eye: Secondary | ICD-10-CM

## 2013-09-14 ENCOUNTER — Ambulatory Visit (INDEPENDENT_AMBULATORY_CARE_PROVIDER_SITE_OTHER)
Admission: RE | Admit: 2013-09-14 | Discharge: 2013-09-14 | Disposition: A | Discharge status: Home / Self Care | Payer: Medicare HMO | Source: Ambulatory Visit | Attending: Internal Medicine | Admitting: Internal Medicine

## 2013-09-14 ENCOUNTER — Ambulatory Visit (INDEPENDENT_AMBULATORY_CARE_PROVIDER_SITE_OTHER): Payer: Medicare HMO | Admitting: Internal Medicine

## 2013-09-14 ENCOUNTER — Encounter: Payer: Self-pay | Admitting: Internal Medicine

## 2013-09-14 VITALS — BP 120/72 | HR 81 | Ht 62.0 in | Wt 178.0 lb

## 2013-09-14 DIAGNOSIS — J3089 Other allergic rhinitis: Secondary | ICD-10-CM

## 2013-09-14 DIAGNOSIS — J4489 Other specified chronic obstructive pulmonary disease: Secondary | ICD-10-CM

## 2013-09-14 DIAGNOSIS — J449 Chronic obstructive pulmonary disease, unspecified: Secondary | ICD-10-CM

## 2013-09-14 DIAGNOSIS — J302 Other seasonal allergic rhinitis: Secondary | ICD-10-CM

## 2013-09-14 DIAGNOSIS — J309 Allergic rhinitis, unspecified: Secondary | ICD-10-CM

## 2013-09-14 MED ORDER — ALBUTEROL SULFATE HFA 108 (90 BASE) MCG/ACT IN AERS: 2.0000 | INHALATION_SPRAY | RESPIRATORY_TRACT | Status: DC | PRN

## 2013-09-14 MED ORDER — TIOTROPIUM BROMIDE MONOHYDRATE 18 MCG IN CAPS: 18.0000 ug | ORAL_CAPSULE | Freq: Every day | RESPIRATORY_TRACT | Status: DC

## 2013-09-14 NOTE — Patient Instructions (Addendum)
Order- CXR  Dx COPD  Sample Dymista nasal spray   1-2 puffs each nostril once daily at bedtime while needed  Scripts printed for Proair rescue inhaler and Spiriva refills  Please call as needed

## 2013-09-14 NOTE — Progress Notes (Signed)
Quick Note:  Patient returned call. Advised of cxr results / recs as stated by CY. Pt verbalized understanding and denied any questions. ______

## 2013-09-14 NOTE — Progress Notes (Signed)
Patient ID: Meghan Castro, female    DOB: 16-Nov-1928, 80 y.o.   MRN: [id number]  HPI 01/28/11- 59 yoF former smoker followed for COPD, hx chronic sinusitis, complicated by large hiatal hernia, macular degeneration, HBP, renal insufficiency. Last here- March 12. 2012 Reports good summer with no new problems Notes runny nose and sneeze in last 2 weeks when she goes outside.  Rescue inhaler< 1/ week. Uses Spiriva regularly.   07/30/11- 28 yoF former smoker followed for COPD, hx chronic sinusitis, complicated by large hiatal hernia, macular degeneration, HBP, renal insufficiency. When she is outdoors she notices more need to blow her nose with watery rhinorrhea. This seems to be perennial with little itch or sneeze. Has not had a bad cold this winter. Not aware of significant shortness of breath with exertion, or cough. Denies reflux but is aware she has a large hiatal hernia. She was upset today. Son recently diagnosed with lung cancer and on chemotherapy.  02/01/12- 55 yoF former smoker followed for COPD, hx chronic sinusitis, complicated by large hiatal hernia, macular degeneration, HBP, renal insufficiency.   Son has lung cancer/ chemo/ XRT.Marland Kitchen   Slight wheezing-? allergies causing this. COPD assessment test (CAT) 16/40 CXR 08/12/11-reviewed IMPRESSION:  Stable hyperinflation and chronic interstitial prominence. Hazy  right basilar atelectasis or early infiltrate. Large hiatal hernia  again noted.   08/2812- 39 yoF former smoker followed for COPD, hx chronic sinusitis, complicated by large hiatal hernia, macular degeneration, HBP, renal insufficiency.   Son has lung cancer/ chemo/ XRT.. FOLLOWS FOR: SOB (? due to pollen and allergy flare up), occasionally coughs, denies any wheezing. Sneezing, chest congestion but little cough. Dr Edrick Oh gave Qnasl but prefers flonase. Advair not much help. CXR 02/03/12 IMPRESSION:  Coarse interstitial markings, chronic lung base opacities, and  hyperinflation,  similar to prior.  Moderate to large hiatal hernia.  Original Report Authenticated By: Suanne Marker, M.D.  03/16/13- 24 yoF former smoker followed for COPD, hx chronic sinusitis, complicated by large hiatal hernia, macular degeneration, HBP, renal insufficiency.   Son has lung cancer/ chemo/ XRT. Had slipped and wrenched low back- tightness affects her breathing but otherwise doing well. Had shingles right lower abdomen, resolved.  09/14/13- 84 yoF former smoker followed for COPD, hx chronic sinusitis, complicated by large hiatal hernia, macular degeneration, HBP, renal insufficiency.   Son has lung cancer/ chemo/ XRT. FOLLOWS FOR:  no change in breathing Admits some seasonal stuffiness and blowing, but not bad this spring. She is trying to cut back on medicines because of cost. Denies any acute chest symptoms.  Review of Systems-see HPI Constitutional:   No-   weight loss, night sweats, fevers, chills, fatigue, lassitude. HEENT:   No-  headaches, difficulty swallowing, tooth/dental problems, sore throat,       No- sneezing, itching, no-ear ache, +nasal congestion, post nasal drip,  CV:  No-   chest pain, orthopnea, PND, swelling in lower extremities, anasarca, dizziness, palpitations Resp: + shortness of breath with exertion or at rest.              No-   productive cough,   non-productive cough,  No-  coughing up of blood.              No-   change in color of mucus.  +little wheezing.   Skin: No-   rash or lesions. GI:  No-   heartburn, indigestion, abdominal pain, nausea, vomiting,  GU:  MS:  No-   joint pain  or swelling.   Neuro- nothing unusual Psych:  No- change in mood or affect.  + depression or anxiety.  No memory loss.    Objective:   Physical Exam General- Alert, Oriented, Affect-calm, Distress- none acute;  Skin- rash-none, lesions- none, excoriation- none Lymphadenopathy- none Head- atraumatic            Eyes- Gross vision limited , PERRLA, conjunctivae clear  secretions            Ears- Hearing, canals- normal            Nose- , No-Septal dev, mucus bridging, polyps, erosion, perforation             Throat- Mallampati II , mucosa clear , drainage- none, tonsils- atrophic Neck- flexible , trachea midline, no stridor , thyroid nl, carotid no bruit Chest - symmetrical excursion , unlabored           Heart/CV- RRR , no murmur , no gallop  , no rub, nl s1 s2                           - JVD- none , edema- none, stasis changes- none, varices- none           Lung- clear/distant, wheeze- none, cough- none , dullness-none, rub- none           Chest wall+ kyphosis Abd-  Br/ Gen/ Rectal- Not done, not indicated Extrem- cyanosis- none, clubbing, none, atrophy- none, strength- nl Neuro- grossly intact to observation

## 2013-10-04 ENCOUNTER — Encounter (INDEPENDENT_AMBULATORY_CARE_PROVIDER_SITE_OTHER): Payer: Medicare HMO | Admitting: Ophthalmology

## 2013-10-04 DIAGNOSIS — H35039 Hypertensive retinopathy, unspecified eye: Secondary | ICD-10-CM

## 2013-10-04 DIAGNOSIS — H3530 Unspecified macular degeneration: Secondary | ICD-10-CM

## 2013-10-04 DIAGNOSIS — I10 Essential (primary) hypertension: Secondary | ICD-10-CM

## 2013-10-04 DIAGNOSIS — H43819 Vitreous degeneration, unspecified eye: Secondary | ICD-10-CM

## 2013-10-04 DIAGNOSIS — H35059 Retinal neovascularization, unspecified, unspecified eye: Secondary | ICD-10-CM

## 2013-10-11 ENCOUNTER — Encounter (INDEPENDENT_AMBULATORY_CARE_PROVIDER_SITE_OTHER): Payer: Medicare HMO | Admitting: Ophthalmology

## 2013-10-11 DIAGNOSIS — H3530 Unspecified macular degeneration: Secondary | ICD-10-CM

## 2013-10-11 DIAGNOSIS — H353290 Exudative age-related macular degeneration, unspecified eye, stage unspecified: Secondary | ICD-10-CM

## 2013-10-15 NOTE — Assessment & Plan Note (Signed)
Adequate control most of the time Plan-sample Dymista for trial

## 2013-10-15 NOTE — Assessment & Plan Note (Signed)
Controlled. Symptom control and medications discussed. Plan-encouraged continued walking for stamina, chest x-ray, refill rescue inhaler

## 2013-12-06 ENCOUNTER — Encounter (INDEPENDENT_AMBULATORY_CARE_PROVIDER_SITE_OTHER): Payer: Medicare HMO | Admitting: Ophthalmology

## 2013-12-06 DIAGNOSIS — H353290 Exudative age-related macular degeneration, unspecified eye, stage unspecified: Secondary | ICD-10-CM

## 2014-01-17 ENCOUNTER — Other Ambulatory Visit: Payer: Self-pay | Admitting: Internal Medicine

## 2014-02-07 ENCOUNTER — Encounter (INDEPENDENT_AMBULATORY_CARE_PROVIDER_SITE_OTHER): Payer: Medicare HMO | Admitting: Ophthalmology

## 2014-02-07 DIAGNOSIS — H3530 Unspecified macular degeneration: Secondary | ICD-10-CM

## 2014-02-07 DIAGNOSIS — I10 Essential (primary) hypertension: Secondary | ICD-10-CM

## 2014-02-07 DIAGNOSIS — H35039 Hypertensive retinopathy, unspecified eye: Secondary | ICD-10-CM

## 2014-02-07 DIAGNOSIS — H353290 Exudative age-related macular degeneration, unspecified eye, stage unspecified: Secondary | ICD-10-CM

## 2014-03-15 ENCOUNTER — Other Ambulatory Visit (INDEPENDENT_AMBULATORY_CARE_PROVIDER_SITE_OTHER): Payer: Medicare HMO

## 2014-03-15 ENCOUNTER — Ambulatory Visit (INDEPENDENT_AMBULATORY_CARE_PROVIDER_SITE_OTHER)
Admission: RE | Admit: 2014-03-15 | Discharge: 2014-03-15 | Disposition: A | Discharge status: Home / Self Care | Payer: Medicare HMO | Source: Ambulatory Visit | Attending: Internal Medicine | Admitting: Internal Medicine

## 2014-03-15 ENCOUNTER — Ambulatory Visit (INDEPENDENT_AMBULATORY_CARE_PROVIDER_SITE_OTHER): Payer: Medicare HMO | Admitting: Internal Medicine

## 2014-03-15 ENCOUNTER — Encounter: Payer: Self-pay | Admitting: Internal Medicine

## 2014-03-15 ENCOUNTER — Telehealth: Payer: Self-pay | Admitting: Internal Medicine

## 2014-03-15 VITALS — BP 110/74 | HR 80 | Ht 62.0 in | Wt 174.8 lb

## 2014-03-15 DIAGNOSIS — J3089 Other allergic rhinitis: Secondary | ICD-10-CM

## 2014-03-15 DIAGNOSIS — J309 Allergic rhinitis, unspecified: Secondary | ICD-10-CM

## 2014-03-15 DIAGNOSIS — J449 Chronic obstructive pulmonary disease, unspecified: Secondary | ICD-10-CM

## 2014-03-15 DIAGNOSIS — J302 Other seasonal allergic rhinitis: Secondary | ICD-10-CM

## 2014-03-15 DIAGNOSIS — R0682 Tachypnea, not elsewhere classified: Secondary | ICD-10-CM

## 2014-03-15 LAB — BASIC METABOLIC PANEL
BUN: 19 mg/dL (ref 6–23)
CALCIUM: 9.4 mg/dL (ref 8.4–10.5)
CO2: 21 mEq/L (ref 19–32)
Chloride: 105 mEq/L (ref 96–112)
Creatinine, Ser: 1 mg/dL (ref 0.4–1.2)
GFR: 59.69 mL/min — AB (ref >60.00)
GLUCOSE: 112 mg/dL — AB (ref 70–99)
Potassium: 4.4 mEq/L (ref 3.5–5.1)
SODIUM: 139 meq/L (ref 135–145)

## 2014-03-15 LAB — CBC WITH DIFFERENTIAL/PLATELET
BASOS ABS: 0 10*3/uL (ref 0.0–0.1)
Basophils Relative: 0.4 % (ref 0.0–3.0)
EOS ABS: 0.1 10*3/uL (ref 0.0–0.7)
EOS PCT: 2.4 % (ref 0.0–5.0)
HCT: 39.8 % (ref 36.0–46.0)
Hemoglobin: 13.3 g/dL (ref 12.0–15.0)
LYMPHS ABS: 2.1 10*3/uL (ref 0.7–4.0)
Lymphocytes Relative: 36.5 % (ref 12.0–46.0)
MCHC: 33.4 g/dL (ref 30.0–36.0)
MCV: 81.6 fl (ref 78.0–100.0)
Monocytes Absolute: 0.7 10*3/uL (ref 0.1–1.0)
Monocytes Relative: 12.6 % — ABNORMAL HIGH (ref 3.0–12.0)
NEUTROS PCT: 48.1 % (ref 43.0–77.0)
Neutro Abs: 2.8 10*3/uL (ref 1.4–7.7)
Platelets: 234 10*3/uL (ref 150.0–400.0)
RBC: 4.88 Mil/uL (ref 3.87–5.11)
RDW: 16.6 % — AB (ref 11.5–15.5)
WBC: 5.8 10*3/uL (ref 4.0–10.5)

## 2014-03-15 LAB — BRAIN NATRIURETIC PEPTIDE: PRO B NATRI PEPTIDE: 34 pg/mL (ref 0.0–100.0)

## 2014-03-15 MED ORDER — LEVALBUTEROL HCL 0.63 MG/3ML IN NEBU
0.6300 mg | INHALATION_SOLUTION | Freq: Once | RESPIRATORY_TRACT | Status: AC
  Administered 2014-03-15: 0.63 mg via RESPIRATORY_TRACT

## 2014-03-15 MED ORDER — METHYLPREDNISOLONE ACETATE 80 MG/ML IJ SUSP
80.0000 mg | Freq: Once | INTRAMUSCULAR | Status: AC
  Administered 2014-03-15: 80 mg via INTRAMUSCULAR

## 2014-03-15 NOTE — Telephone Encounter (Signed)
Pt returned call. Appt made for 05/16/14 at 11:15am with CY. Pt verbalized understanding and denied any further questions or concerns at this time.

## 2014-03-15 NOTE — Telephone Encounter (Signed)
LMTCB-we can add her to 05-16-14 at 11:15am slot. Thanks.

## 2014-03-15 NOTE — Patient Instructions (Signed)
Neb xop 0.63  Depo 80  Order- CXR                                               Dx dyspnea, COPD mixed type,               Lab- CBC w diff, BMET, D-dimer, BNP
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
## 2014-03-16 LAB — D-DIMER, QUANTITATIVE (NOT AT ARMC): D DIMER QUANT: 0.4 ug{FEU}/mL (ref 0.00–0.48)

## 2014-03-16 NOTE — Progress Notes (Signed)
Quick Note:  lmtcb ______ 

## 2014-03-17 NOTE — Assessment & Plan Note (Signed)
Resting saturation today is 96% but she seems a little labored and has basilar crackles Plan-nebulizer Xopenex, Depo-Medrol, chest x-ray, labs for CBC, d-dimer, BNP, BMET

## 2014-03-17 NOTE — Assessment & Plan Note (Signed)
Adequate control. 

## 2014-03-23 ENCOUNTER — Telehealth: Payer: Self-pay | Admitting: Internal Medicine

## 2014-03-23 NOTE — Telephone Encounter (Signed)
Notes Recorded by Doroteo Glassman, RN on 03/22/2014 at 1:30 PM [facility] - Will try back Notes Recorded by Maurice March, RN on 03/16/2014 at 4:13 PM lmtcb Notes Recorded by Deneise Lever, MD on 03/16/2014 at 9:10 AM Labs- look ok without evidence of anemia, heart failure or blood clot  Notes Recorded by Maurice March, RN on 03/16/2014 at 4:13 PM lmtcb Notes Recorded by Deneise Lever, MD on 03/16/2014 at 9:08 AM CXR- COPD with some fibrotic scarring. There probably isn't a lot of change from previous xray  Pt aware of results. Nothing more needed at this time.

## 2014-04-16 ENCOUNTER — Other Ambulatory Visit: Payer: Self-pay | Admitting: Internal Medicine

## 2014-04-25 ENCOUNTER — Encounter (INDEPENDENT_AMBULATORY_CARE_PROVIDER_SITE_OTHER): Payer: Medicare HMO | Admitting: Ophthalmology

## 2014-04-25 DIAGNOSIS — H43813 Vitreous degeneration, bilateral: Secondary | ICD-10-CM

## 2014-04-25 DIAGNOSIS — H3531 Nonexudative age-related macular degeneration: Secondary | ICD-10-CM

## 2014-04-25 DIAGNOSIS — H35033 Hypertensive retinopathy, bilateral: Secondary | ICD-10-CM

## 2014-04-25 DIAGNOSIS — H3532 Exudative age-related macular degeneration: Secondary | ICD-10-CM

## 2014-04-25 DIAGNOSIS — I10 Essential (primary) hypertension: Secondary | ICD-10-CM

## 2014-05-16 ENCOUNTER — Ambulatory Visit (INDEPENDENT_AMBULATORY_CARE_PROVIDER_SITE_OTHER): Payer: Medicare HMO | Admitting: Internal Medicine

## 2014-05-16 ENCOUNTER — Encounter: Payer: Self-pay | Admitting: Internal Medicine

## 2014-05-16 VITALS — BP 126/70 | HR 87 | Ht 62.0 in | Wt 173.0 lb

## 2014-05-16 DIAGNOSIS — R011 Cardiac murmur, unspecified: Secondary | ICD-10-CM

## 2014-05-16 DIAGNOSIS — J449 Chronic obstructive pulmonary disease, unspecified: Secondary | ICD-10-CM

## 2014-05-16 MED ORDER — METHYLPREDNISOLONE ACETATE 80 MG/ML IJ SUSP
80.0000 mg | Freq: Once | INTRAMUSCULAR | Status: AC
Start: 2014-05-16 — End: 2014-05-16
  Administered 2014-05-16: 80 mg via INTRAMUSCULAR

## 2014-05-16 NOTE — Patient Instructions (Addendum)
Depo 80  Dx COPD with chronic bronchitis  Sample Spiriva  Please call as needed
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
## 2014-05-18 DIAGNOSIS — R011 Cardiac murmur, unspecified: Secondary | ICD-10-CM | POA: Insufficient documentation

## 2014-05-18 NOTE — Assessment & Plan Note (Signed)
Probably mild aortic stenosis. It does not sound hemodynamically significant so I doubt it contributes to her dyspnea on exertion. Plan-watch for symptoms to suggest ischemia or heart failure.

## 2014-05-18 NOTE — Assessment & Plan Note (Signed)
Exacerbation mainly with dyspnea, without acute infection. Plan Depo-Medrol, sample Spiriva

## 2014-05-25 ENCOUNTER — Other Ambulatory Visit: Payer: Self-pay | Admitting: Internal Medicine

## 2014-07-25 ENCOUNTER — Encounter (INDEPENDENT_AMBULATORY_CARE_PROVIDER_SITE_OTHER): Payer: Medicare Other | Admitting: Ophthalmology

## 2014-07-25 DIAGNOSIS — I10 Essential (primary) hypertension: Secondary | ICD-10-CM | POA: Diagnosis not present

## 2014-07-25 DIAGNOSIS — H35033 Hypertensive retinopathy, bilateral: Secondary | ICD-10-CM

## 2014-07-25 DIAGNOSIS — H3532 Exudative age-related macular degeneration: Secondary | ICD-10-CM | POA: Diagnosis not present

## 2014-07-25 DIAGNOSIS — H3531 Nonexudative age-related macular degeneration: Secondary | ICD-10-CM | POA: Diagnosis not present

## 2014-07-25 DIAGNOSIS — H43813 Vitreous degeneration, bilateral: Secondary | ICD-10-CM

## 2014-11-07 ENCOUNTER — Encounter (INDEPENDENT_AMBULATORY_CARE_PROVIDER_SITE_OTHER): Payer: Medicare Other | Admitting: Ophthalmology

## 2014-11-07 DIAGNOSIS — I10 Essential (primary) hypertension: Secondary | ICD-10-CM | POA: Diagnosis not present

## 2014-11-07 DIAGNOSIS — H43813 Vitreous degeneration, bilateral: Secondary | ICD-10-CM

## 2014-11-07 DIAGNOSIS — H3532 Exudative age-related macular degeneration: Secondary | ICD-10-CM

## 2014-11-07 DIAGNOSIS — H35033 Hypertensive retinopathy, bilateral: Secondary | ICD-10-CM | POA: Diagnosis not present

## 2014-11-07 DIAGNOSIS — H3531 Nonexudative age-related macular degeneration: Secondary | ICD-10-CM | POA: Diagnosis not present

## 2014-11-15 ENCOUNTER — Encounter: Payer: Self-pay | Admitting: Internal Medicine

## 2014-11-15 ENCOUNTER — Ambulatory Visit (INDEPENDENT_AMBULATORY_CARE_PROVIDER_SITE_OTHER): Payer: Medicare Other | Admitting: Internal Medicine

## 2014-11-15 VITALS — BP 114/82 | HR 68 | Ht 62.0 in | Wt 177.0 lb

## 2014-11-15 DIAGNOSIS — J449 Chronic obstructive pulmonary disease, unspecified: Secondary | ICD-10-CM

## 2014-11-15 DIAGNOSIS — R011 Cardiac murmur, unspecified: Secondary | ICD-10-CM
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
## 2014-11-15 NOTE — Patient Instructions (Signed)
We can continue present meds  Sample Incruse Ellipta  1 puff, once daily    Try this sample instead of spiriva. When the sample runs out you can go back to spiriva  Please call as needed

## 2014-11-19 NOTE — Assessment & Plan Note (Signed)
I can't tell that there has been change in the murmur and she is not describing obvious right heart failure.

## 2014-11-19 NOTE — Assessment & Plan Note (Signed)
I think this is pretty close to her usual condition with some variation depending on the weather. Plan-Instead of Spiriva try sample Incruse Ellipta

## 2014-11-21 ENCOUNTER — Telehealth: Payer: Self-pay | Admitting: Internal Medicine

## 2014-11-21 MED ORDER — UMECLIDINIUM BROMIDE 62.5 MCG/INH IN AEPB: 1.0000 | INHALATION_SPRAY | Freq: Every day | RESPIRATORY_TRACT | Status: DC

## 2014-11-21 NOTE — Telephone Encounter (Signed)
Received call from [facility], Incruse is not covered through patient's insurance.  Patient says that the Incruse works so much better than Spriva.  Per Lattie Haw at [facility], [employer] will cover Anoro, Breo 100, Spiriva. ($45 copay)  If patient were to stay on Incruse, would need to do PA for it.  Allergies  Allergen Reactions  . Moxifloxacin   . Penicillins   . Pravastatin Sodium     REACTION: swelling in tounge and feet turn black  . Quinolones Other (See Comments)    hyper   Current Outpatient Prescriptions on File Prior to Visit  Medication Sig Dispense Refill  . aspirin 325 MG EC tablet Take 325 mg by mouth daily.      . Azelastine-Fluticasone (DYMISTA) 137-50 MCG/ACT SUSP Place 2 sprays into both nostrils at bedtime. (Patient not taking: Reported on 11/15/2014) 1 Bottle 0  . furosemide (LASIX) 40 MG tablet Take 40 mg by mouth daily.      . Ginger, Zingiber officinalis, (GINGER ROOT) 500 MG CAPS Take 2 capsules by mouth daily.      Marland Kitchen losartan (COZAAR) 50 MG tablet Take 1 tablet by mouth daily.    . Multiple Vitamins-Minerals (CENTRUM SILVER PO) Take 1 tablet by mouth daily.      . Multiple Vitamins-Minerals (PRESERVISION AREDS PO) Take by mouth daily.      . potassium chloride (KLOR-CON) 10 MEQ CR tablet Take 10 mEq by mouth daily.      Marland Kitchen PRENAT-FECBN-FEBISG-FA-FISHOIL PO Take 1,000 mg by mouth daily.    Marland Kitchen PROAIR HFA 108 (90 BASE) MCG/ACT inhaler INHALE 2 PUFFS INTO THE LUNGS EVERY 4 (FOUR) HOURS AS NEEDED FOR WHEEZING OR SHORTNESS OF BREATH. 8.5 each 0  . SPIRIVA HANDIHALER 18 MCG inhalation capsule USE 1 CAPSULE IN INHALER DAILY 30 capsule 3  . Umeclidinium Bromide (INCRUSE ELLIPTA) 62.5 MCG/INH AEPB Inhale 1 puff into the lungs daily. 1 each 3   No current facility-administered medications on file prior to visit.

## 2014-11-21 NOTE — Telephone Encounter (Signed)
Called and spoke to pt. Informed her of the recs per CY. Sample of Spiriva Respimat placed up front for pick up. Pt verbalized understanding and denied any further questions or concerns at this time.

## 2014-11-21 NOTE — Telephone Encounter (Signed)
Patient saw Dr. Annamaria Boots last week and was given sample of Incruse.  Patient says that the Incruse is working much better than Spiriva.  Rx sent to pharmacy.  [facility] Patient notified. Nothing further needed.

## 2014-11-21 NOTE — Telephone Encounter (Signed)
Suggest we let her try sample of Spiriva Respiclick with instruction  2 puffs, once daily  If we have one. This may work better for her than regular spriva, but still be covered (hopefully) by her insurance.

## 2014-11-22 ENCOUNTER — Telehealth: Payer: Self-pay | Admitting: Internal Medicine

## 2014-11-22 NOTE — Telephone Encounter (Signed)
Made in error

## 2014-12-21 ENCOUNTER — Telehealth: Payer: Self-pay | Admitting: Internal Medicine

## 2014-12-21 MED ORDER — TIOTROPIUM BROMIDE MONOHYDRATE 2.5 MCG/ACT IN AERS: 2.0000 | INHALATION_SPRAY | Freq: Every day | RESPIRATORY_TRACT | Status: DC

## 2014-12-21 NOTE — Telephone Encounter (Signed)
Patient was given sample of Spiriva Respimat to try instead of the Handihaler.  Patient initially tried Incruse, but she said that her insurance would not pay for it, so she wants to try to send in the respimat to see if they will pay for it.   Rx sent to pharmacy. Patient says she will call us to let us know if they will not cover it. Nothing further needed.

## 2015-03-13 ENCOUNTER — Encounter (INDEPENDENT_AMBULATORY_CARE_PROVIDER_SITE_OTHER): Payer: Medicare Other | Admitting: Ophthalmology

## 2015-03-13 DIAGNOSIS — H353231 Exudative age-related macular degeneration, bilateral, with active choroidal neovascularization: Secondary | ICD-10-CM | POA: Diagnosis not present

## 2015-03-13 DIAGNOSIS — I10 Essential (primary) hypertension: Secondary | ICD-10-CM

## 2015-03-13 DIAGNOSIS — H43813 Vitreous degeneration, bilateral: Secondary | ICD-10-CM

## 2015-03-13 DIAGNOSIS — H35033 Hypertensive retinopathy, bilateral: Secondary | ICD-10-CM | POA: Diagnosis not present

## 2015-03-21 ENCOUNTER — Emergency Department (HOSPITAL_COMMUNITY)
Admission: EM | Admit: 2015-03-21 | Discharge: 2015-03-22 | Disposition: A | Discharge status: Home / Self Care | Payer: Medicare Other | Attending: Emergency Medicine | Admitting: Emergency Medicine

## 2015-03-21 ENCOUNTER — Emergency Department (HOSPITAL_COMMUNITY): Payer: Medicare Other

## 2015-03-21 ENCOUNTER — Encounter (HOSPITAL_COMMUNITY): Payer: Self-pay | Admitting: Emergency Medicine

## 2015-03-21 DIAGNOSIS — Z79899 Other long term (current) drug therapy: Secondary | ICD-10-CM | POA: Insufficient documentation

## 2015-03-21 DIAGNOSIS — Z7982 Long term (current) use of aspirin: Secondary | ICD-10-CM | POA: Diagnosis not present

## 2015-03-21 DIAGNOSIS — I10 Essential (primary) hypertension: Secondary | ICD-10-CM | POA: Insufficient documentation

## 2015-03-21 DIAGNOSIS — Z8719 Personal history of other diseases of the digestive system: Secondary | ICD-10-CM | POA: Insufficient documentation

## 2015-03-21 DIAGNOSIS — Z8669 Personal history of other diseases of the nervous system and sense organs: Secondary | ICD-10-CM | POA: Insufficient documentation

## 2015-03-21 DIAGNOSIS — Z8742 Personal history of other diseases of the female genital tract: Secondary | ICD-10-CM | POA: Diagnosis not present

## 2015-03-21 DIAGNOSIS — Z87891 Personal history of nicotine dependence: Secondary | ICD-10-CM | POA: Diagnosis not present

## 2015-03-21 DIAGNOSIS — J441 Chronic obstructive pulmonary disease with (acute) exacerbation: Secondary | ICD-10-CM | POA: Diagnosis not present

## 2015-03-21 DIAGNOSIS — Z880 Allergy status to penicillin: Secondary | ICD-10-CM | POA: Insufficient documentation

## 2015-03-21 DIAGNOSIS — Z8639 Personal history of other endocrine, nutritional and metabolic disease: Secondary | ICD-10-CM | POA: Insufficient documentation

## 2015-03-21 DIAGNOSIS — R0602 Shortness of breath: Secondary | ICD-10-CM | POA: Diagnosis present

## 2015-03-21 LAB — BASIC METABOLIC PANEL
ANION GAP: 11 (ref 5–15)
BUN: 15 mg/dL (ref 6–20)
CHLORIDE: 106 mmol/L (ref 101–111)
CO2: 23 mmol/L (ref 22–32)
Calcium: 9.9 mg/dL (ref 8.9–10.3)
Creatinine, Ser: 0.8 mg/dL (ref 0.44–1.00)
GFR calc non Af Amer: >60 mL/min (ref >60)
Glucose, Bld: 117 mg/dL — ABNORMAL HIGH (ref 65–99)
Potassium: 4.5 mmol/L (ref 3.5–5.1)
Sodium: 140 mmol/L (ref 135–145)

## 2015-03-21 LAB — CBC
HCT: 40.5 % (ref 36.0–46.0)
HEMOGLOBIN: 13.4 g/dL (ref 12.0–15.0)
MCH: 29.4 pg (ref 26.0–34.0)
MCHC: 33.1 g/dL (ref 30.0–36.0)
MCV: 88.8 fL (ref 78.0–100.0)
Platelets: 212 10*3/uL (ref 150–400)
RBC: 4.56 MIL/uL (ref 3.87–5.11)
RDW: 14.3 % (ref 11.5–15.5)
WBC: 4.8 10*3/uL (ref 4.0–10.5)

## 2015-03-21 LAB — I-STAT TROPONIN, ED: Troponin i, poc: 0.01 ng/mL (ref 0.00–0.08)

## 2015-03-21 NOTE — ED Notes (Signed)
Pt from home for eval of sob that started last night, pt states went to church and copd was flared up by someone's perfume. Pt states always has sob but today got worse and pt reports chest tightness due to sob. Does not normally wear oxygen. Able to speak in complete sentences, airway intact, tachypneic. Skin warm and dry .

## 2015-03-21 NOTE — ED Provider Notes (Signed)
CSN: [id number]     Arrival date & time 03/21/15  1954 History  By signing my name below, I, Meghan Castro, attest that this documentation has been prepared under the direction and in the presence of Meghan Greek, MD. Electronically Signed: Erling Castro, ED Scribe. 03/21/2015. 11:59 PM.     Chief Complaint  Patient presents with  . Shortness of Breath  . Chest Pain    The history is provided by the patient. No language interpreter was used.    HPI Comments: Meghan Castro is a 81 y.o. female with a h/o COPD who presents to the Emergency Department complaining of intermittent, moderate, SOB that occurred last night when exposed to perfume at her church last night. She states she has SOB at baseline due to her COPD but it has been exacerbated since last night. She reports associated chest tightness. Pt uses her Spiriva inhaler at home as needed. She denies any home O2 home. Pt notes she has had an episode like this in the past. Pt is a former smoker. She denies any chest pain or other complaints.    Past Medical History  Diagnosis Date  . Chronic airway obstruction, not elsewhere classified   . Unspecified sinusitis (chronic)   . Diaphragmatic hernia without mention of obstruction or gangrene   . Macular degeneration (senile) of retina, unspecified   . Other and unspecified hyperlipidemia   . Unspecified essential hypertension   . Unspecified disorder resulting from impaired renal function   . Mediastinal adenopathy    Past Surgical History  Procedure Laterality Date  . Arm fracture    . Cholecystectomy    . Total abdominal hysterectomy    . Bilateral salpingoophorectomy    . Incisional hernia repair      abdominal  . Appendectomy    . Cataract extraction     Family History  Problem Relation Age of Onset  . Emphysema Father   . Heart disease Mother   . Lung cancer Father    Social History  Substance Use Topics  . Smoking status: Former Smoker    Quit date:  11/14/1989  . Smokeless tobacco: None  . Alcohol Use: None   OB History    No data available     Review of Systems  Respiratory: Positive for chest tightness and shortness of breath.   Cardiovascular: Negative for chest pain.  All other systems reviewed and are negative.     Allergies  Moxifloxacin; Penicillins; Pravastatin sodium; and Quinolones  Home Medications   Prior to Admission medications   Medication Sig Start Date End Date Taking? Authorizing Provider  aspirin 325 MG EC tablet Take 325 mg by mouth daily.      Historical Provider, MD  Azelastine-Fluticasone (DYMISTA) 137-50 MCG/ACT SUSP Place 2 sprays into both nostrils at bedtime. Patient not taking: Reported on 11/15/2014 09/12/12   Deneise Lever, MD  furosemide (LASIX) 40 MG tablet Take 40 mg by mouth daily.      Historical Provider, MD  Ginger, Zingiber officinalis, (GINGER ROOT) 500 MG CAPS Take 2 capsules by mouth daily.      Historical Provider, MD  losartan (COZAAR) 50 MG tablet Take 1 tablet by mouth daily. 09/07/12   Historical Provider, MD  Multiple Vitamins-Minerals (CENTRUM SILVER PO) Take 1 tablet by mouth daily.      Historical Provider, MD  Multiple Vitamins-Minerals (PRESERVISION AREDS PO) Take by mouth daily.      Historical Provider, MD  potassium chloride (KLOR-CON) 10  MEQ CR tablet Take 10 mEq by mouth daily.      Historical Provider, MD  PRENAT-FECBN-FEBISG-FA-FISHOIL PO Take 1,000 mg by mouth daily.    Historical Provider, MD  PROAIR HFA 108 (90 BASE) MCG/ACT inhaler INHALE 2 PUFFS INTO THE LUNGS EVERY 4 (FOUR) HOURS AS NEEDED FOR WHEEZING OR SHORTNESS OF BREATH. 01/17/14   Deneise Lever, MD  SPIRIVA HANDIHALER 18 MCG inhalation capsule USE 1 CAPSULE IN INHALER DAILY 05/25/14   Deneise Lever, MD  Tiotropium Bromide Monohydrate (SPIRIVA RESPIMAT) 2.5 MCG/ACT AERS Inhale 2 puffs into the lungs daily. 12/21/14   Deneise Lever, MD   Triage Vitals: BP 102/64 mmHg  Pulse 76  Temp(Src) 98.2 F (36.8 C)  (Oral)  Resp 20  Ht '5\' 4"'$  (1.626 m)  Wt 170 lb (77.111 kg)  BMI 29.17 kg/m2  SpO2 95%  Physical Exam  Constitutional: She is oriented to person, place, and time. She appears well-developed and well-nourished. No distress.  HENT:  Head: Normocephalic and atraumatic.  Right Ear: Hearing normal.  Left Ear: Hearing normal.  Nose: Nose normal.  Mouth/Throat: Oropharynx is clear and moist and mucous membranes are normal.  Eyes: Conjunctivae and EOM are normal. Pupils are equal, round, and reactive to light.  Neck: Normal range of motion. Neck supple.  Cardiovascular: Regular rhythm, S1 normal and S2 normal.  Exam reveals no gallop and no friction rub.   No murmur heard. Pulmonary/Chest: Effort normal. No respiratory distress. She has wheezes. She exhibits no tenderness.  Diminished breath sounds at both bases with slight wheeze  Abdominal: Soft. Normal appearance and bowel sounds are normal. There is no hepatosplenomegaly. There is no tenderness. There is no rebound, no guarding, no tenderness at McBurney's point and negative Murphy's sign. No hernia.  Musculoskeletal: Normal range of motion.  Neurological: She is alert and oriented to person, place, and time. She has normal strength. No cranial nerve deficit or sensory deficit. Coordination normal. GCS eye subscore is 4. GCS verbal subscore is 5. GCS motor subscore is 6.  Skin: Skin is warm, dry and intact. No rash noted. No cyanosis.  Psychiatric: She has a normal mood and affect. Her speech is normal and behavior is normal. Thought content normal.  Nursing note and vitals reviewed.   ED Course  Procedures (including critical care time)  DIAGNOSTIC STUDIES: Oxygen Saturation is 95% on RA, normal by my interpretation.    COORDINATION OF CARE: 12:06- Will order CXR, 12-lead EKG, BMP, CBC,  I-stat troponin and breathing treatment. Pt advised of plan for treatment and pt agrees.    Labs Review Labs Reviewed  BASIC METABOLIC PANEL -  Abnormal; Notable for the following:    Glucose, Bld 117 (*)    All other components within normal limits  CBC  I-STAT TROPOININ, ED    Imaging Review Dg Chest 2 View  03/21/2015  CLINICAL DATA:  81 year old with acute COPD exacerbation triggered by perfume. Shortness of breath and chest tightness. EXAM: CHEST  2 VIEW COMPARISON:  03/15/2014 and earlier. FINDINGS: Cardiac silhouette upper normal in size, unchanged, partially obscured by the large hiatal hernia which has been present previously. Thoracic aorta mildly atherosclerotic. Hilar and mediastinal contours otherwise unremarkable. Diffuse interstitial lung disease with interstitial fibrosis suspected, especially peripherally in the lower lobes. Interstitial disease not significantly changed since October, 2015 but progressive since 2013. Hyperinflation with increase in the AP diameter to the chest, unchanged. No new pulmonary parenchymal abnormalities. No visible pleural effusions. No pneumothorax. Exaggeration  of the thoracic kyphosis, degenerative disc disease and spondylosis throughout the thoracic spine and generalized osseous demineralization. IMPRESSION: 1. Hyperinflation consistent with COPD and/or asthma. No acute cardiopulmonary disease. 2. Diffuse interstitial lung disease with likely interstitial pulmonary fibrosis in the lower lobes peripherally. Stable since the examination 1 year ago but increased since 2013. 3. Large hiatal hernia, unchanged. Electronically Signed   By: Evangeline Dakin M.D.   On: 03/21/2015 20:39   I have personally reviewed and evaluated these images and lab results as part of my medical decision-making.   EKG Interpretation   Date/Time:  Thursday March 21 2015 20:07:17 EDT Ventricular Rate:  71 PR Interval:  164 QRS Duration: 68 QT Interval:  382 QTC Calculation: 415 R Axis:   77 Text Interpretation:  Normal sinus rhythm Low voltage QRS Septal infarct ,  age undetermined Abnormal ECG No significant  change since last tracing  Confirmed by POLLINA  MD, [place] [phone]) on 03/21/2015 11:58:09 PM      MDM   Final diagnoses:  None   COPD exacerbation  Presents to the ER for evaluation of wheezing and difficulty breathing after exposure to perfume. Patient does report that this has happened to her previously with environmental exposures. She is not hypoxic at arrival. Her workup has been entirely normal. No concern for volume overload. Cardiac evaluation essentially negative. Patient with significant improvement after albuterol and Atrovent. We'll continue bronchodilator therapy and prednisone at home. Follow-up with PCP, return if symptoms worsen.  I personally performed the services described in this documentation, which was scribed in my presence. The recorded information has been reviewed and is accurate.      Meghan Greek, MD 03/22/15 [id number]

## 2015-03-22 ENCOUNTER — Other Ambulatory Visit: Payer: Self-pay | Admitting: Internal Medicine

## 2015-03-22 MED ORDER — PREDNISONE 20 MG PO TABS: 40.0000 mg | ORAL_TABLET | Freq: Every day | ORAL | Status: DC

## 2015-03-22 MED ORDER — ALBUTEROL SULFATE (2.5 MG/3ML) 0.083% IN NEBU
2.5000 mg | INHALATION_SOLUTION | Freq: Once | RESPIRATORY_TRACT | Status: AC
  Administered 2015-03-22: 2.5 mg via RESPIRATORY_TRACT
  Filled 2015-03-22: qty 3

## 2015-03-22 MED ORDER — PREDNISONE 20 MG PO TABS
60.0000 mg | ORAL_TABLET | Freq: Once | ORAL | Status: AC
  Administered 2015-03-22: 60 mg via ORAL
  Filled 2015-03-22: qty 3

## 2015-03-22 MED ORDER — IPRATROPIUM-ALBUTEROL 0.5-2.5 (3) MG/3ML IN SOLN
3.0000 mL | Freq: Once | RESPIRATORY_TRACT | Status: AC
  Administered 2015-03-22: 3 mL via RESPIRATORY_TRACT
  Filled 2015-03-22: qty 3

## 2015-03-22 NOTE — Discharge Instructions (Signed)
Chronic Obstructive Pulmonary Disease Chronic obstructive pulmonary disease (COPD) is a common lung condition in which airflow from the lungs is limited. COPD is a general term that can be used to describe many different lung problems that limit airflow, including both chronic bronchitis and emphysema. If you have COPD, your lung function will probably never return to normal, but there are measures you can take to improve lung function and make yourself feel better. CAUSES   Smoking (common).  Exposure to secondhand smoke.  Genetic problems.  Chronic inflammatory lung diseases or recurrent infections. SYMPTOMS  Shortness of breath, especially with physical activity.  Deep, persistent (chronic) cough with a large amount of thick mucus.  Wheezing.  Rapid breaths (tachypnea).  Gray or bluish discoloration (cyanosis) of the skin, especially in your fingers, toes, or lips.  Fatigue.  Weight loss.  Frequent infections or episodes when breathing symptoms become much worse (exacerbations).  Chest tightness. DIAGNOSIS Your health care provider will take a medical history and perform a physical examination to diagnose COPD. Additional tests for COPD may include:  Lung (pulmonary) function tests.  Chest X-ray.  CT scan.  Blood tests. TREATMENT  Treatment for COPD may include:  Inhaler and nebulizer medicines. These help manage the symptoms of COPD and make your breathing more comfortable.  Supplemental oxygen. Supplemental oxygen is only helpful if you have a low oxygen level in your blood.  Exercise and physical activity. These are beneficial for nearly all people with COPD.  Lung surgery or transplant.  Nutrition therapy to gain weight, if you are underweight.  Pulmonary rehabilitation. This may involve working with a team of health care providers and specialists, such as respiratory, occupational, and physical therapists. HOME CARE INSTRUCTIONS  Take all medicines  (inhaled or pills) as directed by your health care provider.  Avoid over-the-counter medicines or cough syrups that dry up your airway (such as antihistamines) and slow down the elimination of secretions unless instructed otherwise by your health care provider.  If you are a smoker, the most important thing that you can do is stop smoking. Continuing to smoke will cause further lung damage and breathing trouble. Ask your health care provider for help with quitting smoking. He or she can direct you to community resources or hospitals that provide support.  Avoid exposure to irritants such as smoke, chemicals, and fumes that aggravate your breathing.  Use oxygen therapy and pulmonary rehabilitation if directed by your health care provider. If you require home oxygen therapy, ask your health care provider whether you should purchase a pulse oximeter to measure your oxygen level at home.  Avoid contact with individuals who have a contagious illness.  Avoid extreme temperature and humidity changes.  Eat healthy foods. Eating smaller, more frequent meals and resting before meals may help you maintain your strength.  Stay active, but balance activity with periods of rest. Exercise and physical activity will help you maintain your ability to do things you want to do.  Preventing infection and hospitalization is very important when you have COPD. Make sure to receive all the vaccines your health care provider recommends, especially the pneumococcal and influenza vaccines. Ask your health care provider whether you need a pneumonia vaccine.  Learn and use relaxation techniques to manage stress.  Learn and use controlled breathing techniques as directed by your health care provider. Controlled breathing techniques include:  Pursed lip breathing. Start by breathing in (inhaling) through your nose for 1 second. Then, purse your lips as if you were   going to whistle and breathe out (exhale) through the  pursed lips for 2 seconds.  Diaphragmatic breathing. Start by putting one Ghosh on your abdomen just above your waist. Inhale slowly through your nose. The [place] on your abdomen should move out. Then purse your lips and exhale slowly. You should be able to feel the [place] on your abdomen moving in as you exhale.  Learn and use controlled coughing to clear mucus from your lungs. Controlled coughing is a series of short, progressive coughs. The steps of controlled coughing are: 1. Lean your head slightly forward. 2. Breathe in deeply using diaphragmatic breathing. 3. Try to hold your breath for 3 seconds. 4. Keep your mouth slightly open while coughing twice. 5. Spit any mucus out into a tissue. 6. Rest and repeat the steps once or twice as needed. SEEK MEDICAL CARE IF:  You are coughing up more mucus than usual.  There is a change in the color or thickness of your mucus.  Your breathing is more labored than usual.  Your breathing is faster than usual. SEEK IMMEDIATE MEDICAL CARE IF:  You have shortness of breath while you are resting.  You have shortness of breath that prevents you from:  Being able to talk.  Performing your usual physical activities.  You have chest pain lasting longer than 5 minutes.  Your skin color is more cyanotic than usual.  You measure low oxygen saturations for longer than 5 minutes with a pulse oximeter. MAKE SURE YOU:  Understand these instructions.  Will watch your condition.  Will get help right away if you are not doing well or get worse.   This information is not intended to replace advice given to you by your health care provider. Make sure you discuss any questions you have with your health care provider.   Document Released: 02/11/2005 Document Revised: 05/25/2014 Document Reviewed: 12/29/2012 Elsevier Interactive Patient Education 2016 [employer]  

## 2015-04-17 ENCOUNTER — Other Ambulatory Visit: Payer: Self-pay | Admitting: Internal Medicine

## 2015-04-19 ENCOUNTER — Other Ambulatory Visit: Payer: Self-pay | Admitting: Internal Medicine

## 2015-05-17 ENCOUNTER — Ambulatory Visit (INDEPENDENT_AMBULATORY_CARE_PROVIDER_SITE_OTHER): Payer: Medicare Other | Admitting: Internal Medicine

## 2015-05-17 ENCOUNTER — Encounter: Payer: Self-pay | Admitting: Internal Medicine

## 2015-05-17 VITALS — BP 110/70 | HR 77 | Ht 62.0 in | Wt 168.2 lb

## 2015-05-17 DIAGNOSIS — J449 Chronic obstructive pulmonary disease, unspecified: Secondary | ICD-10-CM | POA: Diagnosis not present

## 2015-05-17 DIAGNOSIS — J849 Interstitial pulmonary disease, unspecified: Secondary | ICD-10-CM | POA: Diagnosis not present

## 2015-05-17 MED ORDER — TIOTROPIUM BROMIDE MONOHYDRATE 2.5 MCG/ACT IN AERS: 2.0000 | INHALATION_SPRAY | Freq: Every day | RESPIRATORY_TRACT | Status: AC
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
## 2015-05-17 NOTE — Assessment & Plan Note (Signed)
2 nonspecific acute exacerbations in the last several months, both seemed to respond to short course prednisone suggesting an inflammatory component. Nonspecific steroid relief of malaise is not excluded Plan-watch clinical course since she is currently without acute symptoms.

## 2015-05-17 NOTE — Assessment & Plan Note (Signed)
Nonspecific. We discussed workup and availability of limited management option if we felt she had specifically UIP. First immediate concern would be possible recurrent aspiration given known hiatal hernia. Plan-repeat chest x-ray 6 months. Emphasis on reflux precautions.

## 2015-05-17 NOTE — Patient Instructions (Addendum)
We will be watching the fibrosis in your lungs to see if it progresses.  Order- future CXR 6 months  Dx interstitial lung disease      For next visit  Printed script and sample Spiriva Respimat if available   2  Puffs, once daily

## 2015-06-14 ENCOUNTER — Encounter (HOSPITAL_COMMUNITY): Payer: Self-pay | Admitting: *Deleted

## 2015-06-14 ENCOUNTER — Emergency Department (HOSPITAL_COMMUNITY)
Admission: EM | Admit: 2015-06-14 | Discharge: 2015-06-15 | Disposition: A | Discharge status: Home / Self Care | Payer: Medicare Other | Attending: Emergency Medicine | Admitting: Emergency Medicine

## 2015-06-14 DIAGNOSIS — Z87448 Personal history of other diseases of urinary system: Secondary | ICD-10-CM | POA: Diagnosis not present

## 2015-06-14 DIAGNOSIS — Z87891 Personal history of nicotine dependence: Secondary | ICD-10-CM | POA: Insufficient documentation

## 2015-06-14 DIAGNOSIS — Z880 Allergy status to penicillin: Secondary | ICD-10-CM | POA: Diagnosis not present

## 2015-06-14 DIAGNOSIS — Z8669 Personal history of other diseases of the nervous system and sense organs: Secondary | ICD-10-CM | POA: Insufficient documentation

## 2015-06-14 DIAGNOSIS — Z8639 Personal history of other endocrine, nutritional and metabolic disease: Secondary | ICD-10-CM | POA: Diagnosis not present

## 2015-06-14 DIAGNOSIS — Z7982 Long term (current) use of aspirin: Secondary | ICD-10-CM | POA: Insufficient documentation

## 2015-06-14 DIAGNOSIS — I10 Essential (primary) hypertension: Secondary | ICD-10-CM | POA: Diagnosis not present

## 2015-06-14 DIAGNOSIS — J449 Chronic obstructive pulmonary disease, unspecified: Secondary | ICD-10-CM | POA: Diagnosis not present

## 2015-06-14 DIAGNOSIS — R197 Diarrhea, unspecified: Secondary | ICD-10-CM

## 2015-06-14 DIAGNOSIS — K529 Noninfective gastroenteritis and colitis, unspecified: Secondary | ICD-10-CM | POA: Insufficient documentation

## 2015-06-14 DIAGNOSIS — Z79899 Other long term (current) drug therapy: Secondary | ICD-10-CM | POA: Diagnosis not present

## 2015-06-14 DIAGNOSIS — R112 Nausea with vomiting, unspecified: Secondary | ICD-10-CM

## 2015-06-14 LAB — COMPREHENSIVE METABOLIC PANEL
ALT: 26 U/L (ref 14–54)
AST: 38 U/L (ref 15–41)
Albumin: 4 g/dL (ref 3.5–5.0)
Alkaline Phosphatase: 75 U/L (ref 38–126)
Anion gap: 11 (ref 5–15)
BUN: 22 mg/dL — ABNORMAL HIGH (ref 6–20)
CALCIUM: 9.2 mg/dL (ref 8.9–10.3)
CHLORIDE: 106 mmol/L (ref 101–111)
CO2: 21 mmol/L — ABNORMAL LOW (ref 22–32)
CREATININE: 0.87 mg/dL (ref 0.44–1.00)
GFR, EST NON AFRICAN AMERICAN: 59 mL/min — AB (ref >60)
Glucose, Bld: 103 mg/dL — ABNORMAL HIGH (ref 65–99)
Potassium: 3.9 mmol/L (ref 3.5–5.1)
Sodium: 138 mmol/L (ref 135–145)
Total Bilirubin: 0.9 mg/dL (ref 0.3–1.2)
Total Protein: 7.4 g/dL (ref 6.5–8.1)

## 2015-06-14 LAB — CBC WITH DIFFERENTIAL/PLATELET
Basophils Absolute: 0 10*3/uL (ref 0.0–0.1)
Basophils Relative: 0 %
EOS PCT: 0 %
Eosinophils Absolute: 0 10*3/uL (ref 0.0–0.7)
HCT: 43.9 % (ref 36.0–46.0)
Hemoglobin: 14.5 g/dL (ref 12.0–15.0)
LYMPHS ABS: 1.1 10*3/uL (ref 0.7–4.0)
LYMPHS PCT: 19 %
MCH: 29.4 pg (ref 26.0–34.0)
MCHC: 33 g/dL (ref 30.0–36.0)
MCV: 89 fL (ref 78.0–100.0)
MONO ABS: 0.5 10*3/uL (ref 0.1–1.0)
MONOS PCT: 8 %
Neutro Abs: 4.4 10*3/uL (ref 1.7–7.7)
Neutrophils Relative %: 73 %
PLATELETS: 180 10*3/uL (ref 150–400)
RBC: 4.93 MIL/uL (ref 3.87–5.11)
RDW: 14.8 % (ref 11.5–15.5)
WBC: 6 10*3/uL (ref 4.0–10.5)

## 2015-06-14 MED ORDER — SODIUM CHLORIDE 0.9 % IV BOLUS (SEPSIS)
1000.0000 mL | Freq: Once | INTRAVENOUS | Status: AC
  Administered 2015-06-14: 1000 mL via INTRAVENOUS

## 2015-06-14 MED ORDER — SODIUM CHLORIDE 0.9 % IV BOLUS (SEPSIS)
500.0000 mL | Freq: Once | INTRAVENOUS | Status: AC
  Administered 2015-06-14: 500 mL via INTRAVENOUS

## 2015-06-14 MED ORDER — ONDANSETRON HCL 4 MG/2ML IJ SOLN
4.0000 mg | Freq: Once | INTRAMUSCULAR | Status: AC
  Administered 2015-06-14: 4 mg via INTRAVENOUS
  Filled 2015-06-14: qty 2

## 2015-06-14 NOTE — ED Notes (Addendum)
Pt states she has had 17 episodes of diarrhea since 3am. Pt has taken 5 lomotil without relief. Pt's temp at 5:30pm was 100.1 and took tylenol. Temp in triage was 98.5. Pt went to urgent care and was sent here for evaluation. Pt also reports a headache and nausea.

## 2015-06-15 LAB — URINALYSIS, ROUTINE W REFLEX MICROSCOPIC
BILIRUBIN URINE: NEGATIVE
Glucose, UA: NEGATIVE mg/dL
Hgb urine dipstick: NEGATIVE
Ketones, ur: 15 mg/dL — AB
NITRITE: NEGATIVE
PH: 6 (ref 5.0–8.0)
SPECIFIC GRAVITY, URINE: 1.025 (ref 1.005–1.030)

## 2015-06-15 LAB — URINE MICROSCOPIC-ADD ON

## 2015-06-15 MED ORDER — ONDANSETRON 8 MG PO TBDP: 8.0000 mg | ORAL_TABLET | Freq: Three times a day (TID) | ORAL | Status: DC | PRN

## 2015-06-15 NOTE — Discharge Instructions (Signed)

## 2015-06-15 NOTE — ED Provider Notes (Signed)
CSN: [id number]     Arrival date & time 06/14/15  1939 History   First MD Initiated Contact with Patient 06/14/15 2208     Chief Complaint  Patient presents with  . Diarrhea     (Consider location/radiation/quality/duration/timing/severity/associated sxs/prior Treatment) The history is provided by the patient and a relative.   Meghan Castro is a 82 y.o. female with a history as outlined below presenting with a 19 hour history of yellow, watery diarrhea which woke her at 3 am today.  Additionally she endorses persistent nausea with episodes of dry heaves, but no overt vomiting along with a fever of 100.1, measured at 5:30 pm and treated with tylenol.  She has also had 5 doses of imodium since 10 am and states it seems to have started to slow but has not stopped her diarrhea.  She denies abdominal pain, but endorses low abdominal cramping prior to bowel movements.  She denies chest pain, shortness of breath, cough or dysuria. She has had reduced urination today and reports generalized fatigue and mild headache.  She has had no known contacts with persons with similar symptoms, no recent travel, no meals of questionable age or improper cooking. Her last antibiotic exposure was mid November for bronchitis.   Past Medical History  Diagnosis Date  . Chronic airway obstruction, not elsewhere classified   . Unspecified sinusitis (chronic)   . Diaphragmatic hernia without mention of obstruction or gangrene   . Macular degeneration (senile) of retina, unspecified   . Other and unspecified hyperlipidemia   . Unspecified essential hypertension   . Unspecified disorder resulting from impaired renal function   . Mediastinal adenopathy    Past Surgical History  Procedure Laterality Date  . Arm fracture    . Cholecystectomy    . Total abdominal hysterectomy    . Bilateral salpingoophorectomy    . Incisional hernia repair      abdominal  . Appendectomy    . Cataract extraction     Family History   Problem Relation Age of Onset  . Emphysema Father   . Heart disease Mother   . Lung cancer Father    Social History  Substance Use Topics  . Smoking status: Former Smoker    Quit date: 11/14/1989  . Smokeless tobacco: None  . Alcohol Use: No   OB History    No data available     Review of Systems  Constitutional: Positive for fatigue. Negative for fever.  HENT: Negative for congestion and sore throat.   Eyes: Negative.   Respiratory: Negative for chest tightness and shortness of breath.   Cardiovascular: Negative for chest pain.  Gastrointestinal: Positive for nausea and diarrhea. Negative for abdominal pain.  Genitourinary: Negative.   Musculoskeletal: Negative for joint swelling, arthralgias and neck pain.  Skin: Negative.  Negative for rash and wound.  Neurological: Positive for weakness and headaches. Negative for dizziness, light-headedness and numbness.  Psychiatric/Behavioral: Negative.       Allergies  Moxifloxacin; Penicillins; Pravastatin sodium; and Quinolones  Home Medications   Prior to Admission medications   Medication Sig Start Date End Date Taking? Authorizing Provider  acetaminophen (TYLENOL) 500 MG tablet Take 500 mg by mouth every 6 (six) hours as needed for mild pain or moderate pain.   Yes Historical Provider, MD  aspirin 325 MG EC tablet Take 325 mg by mouth daily.     Yes Historical Provider, MD  furosemide (LASIX) 40 MG tablet Take 40 mg by mouth daily.  Yes Historical Provider, MD  Ginger, Zingiber officinalis, (GINGER ROOT) 500 MG CAPS Take 2 capsules by mouth daily.     Yes Historical Provider, MD  loperamide (IMODIUM A-D) 2 MG tablet Take 2 mg by mouth 4 (four) times daily as needed for diarrhea or loose stools.   Yes Historical Provider, MD  losartan (COZAAR) 50 MG tablet Take 1 tablet by mouth daily. 09/07/12  Yes Historical Provider, MD  Multiple Vitamin (MULTIVITAMIN WITH MINERALS) TABS tablet Take 1 tablet by mouth daily.   Yes  Historical Provider, MD  Omega-3 Fatty Acids (FISH OIL) 1000 MG CAPS Take 1 capsule by mouth daily.   Yes Historical Provider, MD  potassium chloride (K-DUR,KLOR-CON) 10 MEQ tablet Take 10 mEq by mouth daily. 06/12/15  Yes Historical Provider, MD  PROAIR HFA 108 (90 BASE) MCG/ACT inhaler INHALE 2 PUFFS INTO THE LUNGS EVERY 4 (FOUR) HOURS AS NEEDED FOR WHEEZING OR SHORTNESS OF BREATH. 04/19/15  Yes Deneise Lever, MD  Tiotropium Bromide Monohydrate (SPIRIVA RESPIMAT) 2.5 MCG/ACT AERS Inhale 2 puffs into the lungs daily. 05/17/15  Yes Deneise Lever, MD  albuterol (PROVENTIL) (2.5 MG/3ML) 0.083% nebulizer solution Take 2.5 mg by nebulization every 6 (six) hours as needed for wheezing or shortness of breath.  04/01/15   Historical Provider, MD  ondansetron (ZOFRAN ODT) 8 MG disintegrating tablet Take 1 tablet (8 mg total) by mouth every 8 (eight) hours as needed for nausea or vomiting. 06/15/15   Evalee Jefferson, PA-C   BP 111/66 mmHg  Pulse 82  Temp(Src) 98.5 F (36.9 C) (Oral)  Resp 18  Ht '5\' 5"'$  (1.651 m)  Wt 75.07 kg  BMI 27.54 kg/m2  SpO2 93% Physical Exam  Constitutional: She appears well-developed and well-nourished.  HENT:  Head: Normocephalic and atraumatic.  Mouth/Throat: Uvula is midline and oropharynx is clear and moist. Mucous membranes are dry. No oropharyngeal exudate or posterior oropharyngeal erythema.  Eyes: Conjunctivae are normal.  Neck: Normal range of motion.  Cardiovascular: Normal rate, regular rhythm, normal heart sounds and intact distal pulses.   Pulmonary/Chest: Effort normal and breath sounds normal. She has no decreased breath sounds. She has no wheezes. She has no rhonchi.  Abdominal: Soft. Bowel sounds are normal. There is tenderness in the left lower quadrant. There is no rebound and no guarding.  Mild tenderness llq, no guarding, abdomen soft without distention or increased tympany  Musculoskeletal: Normal range of motion.  Neurological: She is alert.  Skin: Skin  is warm and dry.  Psychiatric: She has a normal mood and affect.  Nursing note and vitals reviewed.   ED Course  Procedures (including critical care time) Labs Review Labs Reviewed  COMPREHENSIVE METABOLIC PANEL - Abnormal; Notable for the following:    CO2 21 (*)    Glucose, Bld 103 (*)    BUN 22 (*)    GFR calc non Af Amer 59 (*)    All other components within normal limits  URINALYSIS, ROUTINE W REFLEX MICROSCOPIC (NOT AT [facility]) - Abnormal; Notable for the following:    Ketones, ur 15 (*)    Protein, ur TRACE (*)    Leukocytes, UA TRACE (*)    All other components within normal limits  URINE MICROSCOPIC-ADD ON - Abnormal; Notable for the following:    Squamous Epithelial / LPF 0-5 (*)    Bacteria, UA FEW (*)    All other components within normal limits  CBC WITH DIFFERENTIAL/PLATELET    Imaging Review No results found. I have  personally reviewed and evaluated these images and lab results as part of my medical decision-making.   EKG Interpretation None      MDM   Final diagnoses:  Acute gastroenteritis  Nausea vomiting and diarrhea    Pt was given IV fluids, zofran, no emesis, tolerated PO fluids.  She had no diarrhea while in the dept and felt improved at time of dc.  Prescribed zofran prn.  Suspect viral gastroenteritis. Advised recheck here for any worsened sx, increase fluid intake, bland diet.  F/u with with pcp if sx persist beyond the next 48 hours.    Evalee Jefferson, PA-C 06/15/15 0136  Dorie Rank, MD 06/15/15 [phone]

## 2015-07-16 ENCOUNTER — Ambulatory Visit (INDEPENDENT_AMBULATORY_CARE_PROVIDER_SITE_OTHER): Payer: Medicare Other | Admitting: Internal Medicine

## 2015-07-16 ENCOUNTER — Encounter: Payer: Self-pay | Admitting: Internal Medicine

## 2015-07-16 VITALS — BP 108/66 | HR 95 | Ht 62.0 in | Wt 163.2 lb

## 2015-07-16 DIAGNOSIS — J449 Chronic obstructive pulmonary disease, unspecified: Secondary | ICD-10-CM | POA: Diagnosis not present

## 2015-07-16 DIAGNOSIS — J4489 Other specified chronic obstructive pulmonary disease: Secondary | ICD-10-CM

## 2015-07-16 DIAGNOSIS — J309 Allergic rhinitis, unspecified: Secondary | ICD-10-CM

## 2015-07-16 DIAGNOSIS — J849 Interstitial pulmonary disease, unspecified: Secondary | ICD-10-CM | POA: Diagnosis not present

## 2015-07-16 DIAGNOSIS — J3089 Other allergic rhinitis: Secondary | ICD-10-CM

## 2015-07-16 DIAGNOSIS — J302 Other seasonal allergic rhinitis: Secondary | ICD-10-CM

## 2015-07-16 MED ORDER — METHYLPREDNISOLONE ACETATE 80 MG/ML IJ SUSP
80.0000 mg | Freq: Once | INTRAMUSCULAR | Status: AC
  Administered 2015-07-16: 80 mg via INTRAMUSCULAR
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
## 2015-07-16 NOTE — Assessment & Plan Note (Signed)
Unsure how many problems contribute to her dyspnea complaint. Plan-Depo-Medrol, sample Breo, , walk test on room air, overnight oximetry

## 2015-07-16 NOTE — Patient Instructions (Signed)
Sample Breo Ellipta 200     Inhale 1 puff then rinse mouth, once daily  Sample Dymista nasal spray    1-2 puffs each nostril once daily at bedtime  Depo 80    Dx COPD, dyspnea on exertion  Order- ONOX riooim air    Dx COPD   Order- walk test on room air-   Dx COPD  Order- schedule High resolution CT chest  No contrast     Dx interstitial lung disease

## 2015-07-16 NOTE — Assessment & Plan Note (Signed)
Basilar crackles. I discussed the potential significance depending on the type of interstitial disease. Plan-high-resolution CT chest

## 2015-07-16 NOTE — Assessment & Plan Note (Signed)
Plan-sample Dymista. nasal spray

## 2015-07-17 ENCOUNTER — Encounter (INDEPENDENT_AMBULATORY_CARE_PROVIDER_SITE_OTHER): Payer: Medicare Other | Admitting: Ophthalmology

## 2015-07-17 DIAGNOSIS — I10 Essential (primary) hypertension: Secondary | ICD-10-CM | POA: Diagnosis not present

## 2015-07-17 DIAGNOSIS — H43813 Vitreous degeneration, bilateral: Secondary | ICD-10-CM

## 2015-07-17 DIAGNOSIS — H353231 Exudative age-related macular degeneration, bilateral, with active choroidal neovascularization: Secondary | ICD-10-CM

## 2015-07-17 DIAGNOSIS — H35033 Hypertensive retinopathy, bilateral: Secondary | ICD-10-CM

## 2015-07-20 ENCOUNTER — Encounter (HOSPITAL_COMMUNITY): Payer: Self-pay | Admitting: Emergency Medicine

## 2015-07-20 ENCOUNTER — Emergency Department (HOSPITAL_COMMUNITY): Payer: Medicare Other

## 2015-07-20 ENCOUNTER — Inpatient Hospital Stay (HOSPITAL_COMMUNITY)
Admission: EM | Admit: 2015-07-20 | Discharge: 2015-07-26 | DRG: 166 | Disposition: A | Discharge status: Home Health | Payer: Medicare Other | Attending: Internal Medicine | Admitting: Internal Medicine

## 2015-07-20 DIAGNOSIS — I10 Essential (primary) hypertension: Secondary | ICD-10-CM | POA: Diagnosis present

## 2015-07-20 DIAGNOSIS — R197 Diarrhea, unspecified: Secondary | ICD-10-CM | POA: Diagnosis not present

## 2015-07-20 DIAGNOSIS — E875 Hyperkalemia: Secondary | ICD-10-CM | POA: Diagnosis present

## 2015-07-20 DIAGNOSIS — Z90710 Acquired absence of both cervix and uterus: Secondary | ICD-10-CM | POA: Diagnosis not present

## 2015-07-20 DIAGNOSIS — Z87891 Personal history of nicotine dependence: Secondary | ICD-10-CM

## 2015-07-20 DIAGNOSIS — C342 Malignant neoplasm of middle lobe, bronchus or lung: Secondary | ICD-10-CM | POA: Diagnosis present

## 2015-07-20 DIAGNOSIS — Z8249 Family history of ischemic heart disease and other diseases of the circulatory system: Secondary | ICD-10-CM

## 2015-07-20 DIAGNOSIS — E860 Dehydration: Secondary | ICD-10-CM | POA: Diagnosis present

## 2015-07-20 DIAGNOSIS — R918 Other nonspecific abnormal finding of lung field: Secondary | ICD-10-CM | POA: Diagnosis not present

## 2015-07-20 DIAGNOSIS — J441 Chronic obstructive pulmonary disease with (acute) exacerbation: Secondary | ICD-10-CM | POA: Diagnosis present

## 2015-07-20 DIAGNOSIS — R599 Enlarged lymph nodes, unspecified: Secondary | ICD-10-CM | POA: Diagnosis not present

## 2015-07-20 DIAGNOSIS — F419 Anxiety disorder, unspecified: Secondary | ICD-10-CM | POA: Diagnosis present

## 2015-07-20 DIAGNOSIS — C801 Malignant (primary) neoplasm, unspecified: Secondary | ICD-10-CM | POA: Diagnosis not present

## 2015-07-20 DIAGNOSIS — R0602 Shortness of breath: Secondary | ICD-10-CM

## 2015-07-20 DIAGNOSIS — C3491 Malignant neoplasm of unspecified part of right bronchus or lung: Secondary | ICD-10-CM | POA: Diagnosis not present

## 2015-07-20 DIAGNOSIS — R590 Localized enlarged lymph nodes: Secondary | ICD-10-CM | POA: Insufficient documentation

## 2015-07-20 DIAGNOSIS — H3530 Unspecified macular degeneration: Secondary | ICD-10-CM | POA: Diagnosis present

## 2015-07-20 DIAGNOSIS — J9601 Acute respiratory failure with hypoxia: Secondary | ICD-10-CM | POA: Diagnosis present

## 2015-07-20 DIAGNOSIS — C787 Secondary malignant neoplasm of liver and intrahepatic bile duct: Secondary | ICD-10-CM | POA: Diagnosis present

## 2015-07-20 DIAGNOSIS — J9600 Acute respiratory failure, unspecified whether with hypoxia or hypercapnia: Secondary | ICD-10-CM

## 2015-07-20 DIAGNOSIS — Z801 Family history of malignant neoplasm of trachea, bronchus and lung: Secondary | ICD-10-CM | POA: Diagnosis not present

## 2015-07-20 DIAGNOSIS — E785 Hyperlipidemia, unspecified: Secondary | ICD-10-CM | POA: Diagnosis not present

## 2015-07-20 DIAGNOSIS — R0902 Hypoxemia: Secondary | ICD-10-CM | POA: Diagnosis not present

## 2015-07-20 LAB — BASIC METABOLIC PANEL
ANION GAP: 11 (ref 5–15)
BUN: 21 mg/dL — ABNORMAL HIGH (ref 6–20)
CALCIUM: 9.7 mg/dL (ref 8.9–10.3)
CO2: 22 mmol/L (ref 22–32)
Chloride: 107 mmol/L (ref 101–111)
Creatinine, Ser: 1.09 mg/dL — ABNORMAL HIGH (ref 0.44–1.00)
GFR, EST AFRICAN AMERICAN: 52 mL/min — AB (ref >60)
GFR, EST NON AFRICAN AMERICAN: 45 mL/min — AB (ref >60)
GLUCOSE: 105 mg/dL — AB (ref 65–99)
POTASSIUM: 4.7 mmol/L (ref 3.5–5.1)
SODIUM: 140 mmol/L (ref 135–145)

## 2015-07-20 LAB — CBC
HEMATOCRIT: 46 % (ref 36.0–46.0)
HEMOGLOBIN: 15.1 g/dL — AB (ref 12.0–15.0)
MCH: 29.8 pg (ref 26.0–34.0)
MCHC: 32.8 g/dL (ref 30.0–36.0)
MCV: 90.7 fL (ref 78.0–100.0)
Platelets: 239 10*3/uL (ref 150–400)
RBC: 5.07 MIL/uL (ref 3.87–5.11)
RDW: 14.9 % (ref 11.5–15.5)
WBC: 8.8 10*3/uL (ref 4.0–10.5)

## 2015-07-20 LAB — I-STAT CG4 LACTIC ACID, ED
LACTIC ACID, VENOUS: 1.66 mmol/L (ref 0.5–2.0)
Lactic Acid, Venous: 1.62 mmol/L (ref 0.5–2.0)

## 2015-07-20 MED ORDER — IPRATROPIUM BROMIDE 0.02 % IN SOLN: 0.5000 mg | Freq: Four times a day (QID) | RESPIRATORY_TRACT | Status: DC

## 2015-07-20 MED ORDER — ADULT MULTIVITAMIN W/MINERALS CH
1.0000 | ORAL_TABLET | Freq: Every day | ORAL | Status: DC
  Administered 2015-07-21 – 2015-07-26 (×5): 1 via ORAL
  Filled 2015-07-20 (×5): qty 1

## 2015-07-20 MED ORDER — GUAIFENESIN ER 600 MG PO TB12
600.0000 mg | ORAL_TABLET | Freq: Two times a day (BID) | ORAL | Status: DC
  Administered 2015-07-20 – 2015-07-24 (×8): 600 mg via ORAL
  Filled 2015-07-20 (×9): qty 1

## 2015-07-20 MED ORDER — DEXTROSE 5 % IV SOLN
500.0000 mg | Freq: Once | INTRAVENOUS | Status: AC
  Administered 2015-07-20: 500 mg via INTRAVENOUS

## 2015-07-20 MED ORDER — ALBUTEROL SULFATE (2.5 MG/3ML) 0.083% IN NEBU
5.0000 mg | INHALATION_SOLUTION | Freq: Once | RESPIRATORY_TRACT | Status: AC
  Administered 2015-07-20: 5 mg via RESPIRATORY_TRACT
  Filled 2015-07-20: qty 6

## 2015-07-20 MED ORDER — IPRATROPIUM-ALBUTEROL 0.5-2.5 (3) MG/3ML IN SOLN
3.0000 mL | Freq: Four times a day (QID) | RESPIRATORY_TRACT | Status: DC
  Administered 2015-07-20: 3 mL via RESPIRATORY_TRACT

## 2015-07-20 MED ORDER — IPRATROPIUM-ALBUTEROL 0.5-2.5 (3) MG/3ML IN SOLN
RESPIRATORY_TRACT | Status: AC
  Administered 2015-07-20: 3 mL via RESPIRATORY_TRACT
  Filled 2015-07-20: qty 3

## 2015-07-20 MED ORDER — ONDANSETRON HCL 4 MG/2ML IJ SOLN: 4.0000 mg | Freq: Four times a day (QID) | INTRAMUSCULAR | Status: DC | PRN

## 2015-07-20 MED ORDER — METHYLPREDNISOLONE SODIUM SUCC 40 MG IJ SOLR
40.0000 mg | Freq: Three times a day (TID) | INTRAMUSCULAR | Status: DC
  Administered 2015-07-20 – 2015-07-22 (×5): 40 mg via INTRAVENOUS
  Filled 2015-07-20 (×5): qty 1

## 2015-07-20 MED ORDER — DEXTROSE 5 % IV SOLN
500.0000 mg | Freq: Once | INTRAVENOUS | Status: DC
  Filled 2015-07-20: qty 500

## 2015-07-20 MED ORDER — TIOTROPIUM BROMIDE MONOHYDRATE 18 MCG IN CAPS
1.0000 | ORAL_CAPSULE | Freq: Every day | RESPIRATORY_TRACT | Status: DC
  Administered 2015-07-22 – 2015-07-24 (×3): 18 ug via RESPIRATORY_TRACT
  Filled 2015-07-20: qty 5

## 2015-07-20 MED ORDER — OMEGA-3-ACID ETHYL ESTERS 1 G PO CAPS
1.0000 g | ORAL_CAPSULE | Freq: Every day | ORAL | Status: DC
  Administered 2015-07-21 – 2015-07-26 (×5): 1 g via ORAL
  Filled 2015-07-20 (×5): qty 1

## 2015-07-20 MED ORDER — IPRATROPIUM-ALBUTEROL 0.5-2.5 (3) MG/3ML IN SOLN
3.0000 mL | Freq: Three times a day (TID) | RESPIRATORY_TRACT | Status: DC
  Administered 2015-07-21 – 2015-07-24 (×11): 3 mL via RESPIRATORY_TRACT
  Filled 2015-07-20 (×12): qty 3

## 2015-07-20 MED ORDER — DEXTROSE 5 % IV SOLN
500.0000 mg | INTRAVENOUS | Status: DC
  Administered 2015-07-21: 500 mg via INTRAVENOUS
  Filled 2015-07-20: qty 500

## 2015-07-20 MED ORDER — HEPARIN SODIUM (PORCINE) 5000 UNIT/ML IJ SOLN
5000.0000 [IU] | Freq: Three times a day (TID) | INTRAMUSCULAR | Status: DC
Start: 2015-07-20 — End: 2015-07-26
  Administered 2015-07-20 – 2015-07-26 (×16): 5000 [IU] via SUBCUTANEOUS
  Filled 2015-07-20 (×16): qty 1

## 2015-07-20 MED ORDER — ALBUTEROL SULFATE (2.5 MG/3ML) 0.083% IN NEBU: 2.5000 mg | INHALATION_SOLUTION | Freq: Four times a day (QID) | RESPIRATORY_TRACT | Status: DC

## 2015-07-20 MED ORDER — POTASSIUM CHLORIDE CRYS ER 10 MEQ PO TBCR
10.0000 meq | EXTENDED_RELEASE_TABLET | Freq: Every day | ORAL | Status: DC
  Administered 2015-07-21 – 2015-07-22 (×2): 10 meq via ORAL
  Filled 2015-07-20 (×2): qty 1

## 2015-07-20 MED ORDER — SENNA 8.6 MG PO TABS
1.0000 | ORAL_TABLET | Freq: Two times a day (BID) | ORAL | Status: DC
  Administered 2015-07-20 – 2015-07-21 (×2): 8.6 mg via ORAL
  Filled 2015-07-20 (×3): qty 1

## 2015-07-20 MED ORDER — HYDROCODONE-ACETAMINOPHEN 5-325 MG PO TABS: 1.0000 | ORAL_TABLET | ORAL | Status: DC | PRN

## 2015-07-20 MED ORDER — CEFTRIAXONE SODIUM 1 G IJ SOLR
1.0000 g | Freq: Once | INTRAMUSCULAR | Status: AC
  Administered 2015-07-20: 1 g via INTRAVENOUS
  Filled 2015-07-20: qty 10

## 2015-07-20 MED ORDER — ALBUTEROL SULFATE (2.5 MG/3ML) 0.083% IN NEBU: 2.5000 mg | INHALATION_SOLUTION | RESPIRATORY_TRACT | Status: DC | PRN

## 2015-07-20 MED ORDER — ACETAMINOPHEN 500 MG PO TABS: 500.0000 mg | ORAL_TABLET | Freq: Four times a day (QID) | ORAL | Status: DC | PRN

## 2015-07-20 MED ORDER — ONDANSETRON HCL 4 MG PO TABS: 4.0000 mg | ORAL_TABLET | Freq: Four times a day (QID) | ORAL | Status: DC | PRN

## 2015-07-20 MED ORDER — GINGER ROOT 500 MG PO CAPS: 2.0000 | ORAL_CAPSULE | Freq: Every day | ORAL | Status: DC

## 2015-07-20 MED ORDER — SODIUM CHLORIDE 0.9 % IV SOLN
INTRAVENOUS | Status: DC
  Administered 2015-07-20 – 2015-07-22 (×3): via INTRAVENOUS

## 2015-07-20 MED ORDER — PANTOPRAZOLE SODIUM 40 MG PO TBEC
40.0000 mg | DELAYED_RELEASE_TABLET | Freq: Every day | ORAL | Status: DC
  Administered 2015-07-20 – 2015-07-23 (×4): 40 mg via ORAL
  Filled 2015-07-20 (×5): qty 1

## 2015-07-20 MED ORDER — DEXTROSE 5 % IV SOLN
1.0000 g | INTRAVENOUS | Status: DC
  Administered 2015-07-21: 1 g via INTRAVENOUS
  Filled 2015-07-20: qty 10

## 2015-07-20 MED ORDER — LOSARTAN POTASSIUM 50 MG PO TABS
50.0000 mg | ORAL_TABLET | Freq: Every day | ORAL | Status: DC
  Filled 2015-07-20: qty 1

## 2015-07-20 NOTE — ED Notes (Signed)
Hospitalist at bedside 

## 2015-07-20 NOTE — ED Notes (Signed)
One unsuccessful IV attempt by Kirke Shaggy and twice unsuccessful IV attempt by myself; Manuela Schwartz reports will attempt with US guide.

## 2015-07-20 NOTE — H&P (Signed)
Patient Demographics  Meghan Castro, is a 82 y.o. female  MRN: [id number]   DOB - 10-15-1928  Admit Date - 07/20/2015  Outpatient Primary MD for the patient is [name], MD   With History of -  Past Medical History  Diagnosis Date  . Chronic airway obstruction, not elsewhere classified   . Unspecified sinusitis (chronic)   . Diaphragmatic hernia without mention of obstruction or gangrene   . Macular degeneration (senile) of retina, unspecified   . Other and unspecified hyperlipidemia   . Unspecified essential hypertension   . Unspecified disorder resulting from impaired renal function   . Mediastinal adenopathy       Past Surgical History  Procedure Laterality Date  . Arm fracture    . Cholecystectomy    . Total abdominal hysterectomy    . Bilateral salpingoophorectomy    . Incisional hernia repair      abdominal  . Appendectomy    . Cataract extraction      in for   Chief Complaint  Patient presents with  . COPD     HPI  Meghan Castro  is a 82 y.o. female, with past medical history of COPD, hypertension, large hiatal hernia, macular degeneration, and renal insufficiency, patient presents to ED with complaints of worsening dyspnea over the last few days, complains of cough, with a productive thick white sputum, patient denies any sick contacts, fever or chills, in ED was hypoxic 88% on room air, a shunt was supposed to have high resolution CT chest by her primary pulmonary next week, she had it done in ED, CT chest came back significant with multiple lung masses history of stage IV pulmonary lung cancer, given patient's hypoxia, and significant dyspnea she will be admitted for further workup.     Review of Systems    In addition to the HPI above,  No Fever-chills, No Headache, No changes with Vision or hearing, No problems swallowing food or Liquids, No Chest pain, worse worsening cough, productive, and worsening dyspnea . No Abdominal pain, No Nausea  or Vommitting, Bowel movements are regular, No Blood in stool or Urine, No dysuria, No new skin rashes or bruises, No new joints pains-aches,  No new weakness, tingling, numbness in any extremity, Reports recent weight loss and poor appetite No polyuria, polydypsia or polyphagia, No significant Mental Stressors.  A full 10 point Review of Systems was done, except as stated above, all other Review of Systems were negative.   Social History Social History  Substance Use Topics  . Smoking status: Former Smoker    Quit date: 11/14/1989  . Smokeless tobacco: Not on file  . Alcohol Use: No     Family History Family History  Problem Relation Age of Onset  . Emphysema Father   . Heart disease Mother   . Lung cancer Father      Prior to Admission medications   Medication Sig Start Date End Date Taking? Authorizing Provider  acetaminophen (TYLENOL) 500 MG tablet Take 500 mg by mouth every 6 (six) hours as needed for mild pain or moderate pain.   Yes Historical Provider, MD  aspirin 325 MG EC tablet Take 325 mg by mouth daily.     Yes Historical Provider, MD  furosemide (LASIX) 40 MG tablet Take 40 mg by mouth daily.     Yes Historical Provider, MD  Ginger, Zingiber officinalis, (GINGER ROOT) 500 MG CAPS Take 2 capsules by mouth daily.     Yes Historical  Provider, MD  losartan (COZAAR) 50 MG tablet Take 50 mg by mouth daily.  09/07/12  Yes Historical Provider, MD  Multiple Vitamin (MULTIVITAMIN WITH MINERALS) TABS tablet Take 1 tablet by mouth daily.   Yes Historical Provider, MD  Omega-3 Fatty Acids (FISH OIL) 1000 MG CAPS Take 1 capsule by mouth daily.   Yes Historical Provider, MD  potassium chloride (K-DUR,KLOR-CON) 10 MEQ tablet Take 10 mEq by mouth daily. 06/12/15  Yes Historical Provider, MD  PROAIR HFA 108 (90 BASE) MCG/ACT inhaler INHALE 2 PUFFS INTO THE LUNGS EVERY 4 (FOUR) HOURS AS NEEDED FOR WHEEZING OR SHORTNESS OF BREATH. 04/19/15  Yes Deneise Lever, MD  Tiotropium  Bromide Monohydrate (SPIRIVA RESPIMAT) 2.5 MCG/ACT AERS Inhale 2 puffs into the lungs daily. 05/17/15  Yes Deneise Lever, MD    Allergies  Allergen Reactions  . Other Shortness Of Breath    *Perfumes*  . Penicillins     Has patient had a PCN reaction causing immediate rash, facial/tongue/throat swelling, SOB or lightheadedness with hypotension: No Has patient had a PCN reaction causing severe rash involving mucus membranes or skin necrosis: No Has patient had a PCN reaction that required hospitalization No Has patient had a PCN reaction occurring within the last 10 years: No If all of the above answers are "NO", then may proceed with Cephalosporin use.   . Pravastatin Sodium     REACTION: swelling in tounge and feet turn black  . Quinolones Other (See Comments)    hyper  . Moxifloxacin Anxiety    Physical Exam  Vitals  Blood pressure 140/63, pulse 93, temperature 97.9 F (36.6 C), temperature source Oral, resp. rate 24, SpO2 95 %.   1. General frail elderly female lying in bed in NAD,   2. Normal affect and insight, Not Suicidal or Homicidal, Awake Alert, Oriented X 3.  3. No F.N deficits, ALL C.Nerves Intact, Strength 5/5 all 4 extremities, Sensation intact all 4 extremities, Plantars down going.  4. Ears and Eyes appear Normal, Conjunctivae clear, PERRLA. Moist Oral Mucosa.  5. Supple Neck, No JVD, No cervical lymphadenopathy appriciated, No Carotid Bruits.  6. Symmetrical Chest wall movement, fair air movement bilaterally, scattered wheezing and rhonchi  7. RRR, No Gallops, Rubs or Murmurs, No Parasternal Heave.  8. Positive Bowel Sounds, Abdomen Soft, No tenderness, No organomegaly appriciated,No rebound -guarding or rigidity.  9.  No Cyanosis, Normal Skin Turgor, No Skin Rash or Bruise.  10. Good muscle tone,  joints appear normal , no effusions, Normal ROM.    Data Review  CBC  Recent Labs Lab 07/20/15 1435  WBC 8.8  HGB 15.1*  HCT 46.0  PLT 239    MCV 90.7  MCH 29.8  MCHC 32.8  RDW 14.9   ------------------------------------------------------------------------------------------------------------------  Chemistries   Recent Labs Lab 07/20/15 1435  NA 140  K 4.7  CL 107  CO2 22  GLUCOSE 105*  BUN 21*  CREATININE 1.09*  CALCIUM 9.7   ------------------------------------------------------------------------------------------------------------------ estimated creatinine clearance is 36.2 mL/min (by C-G formula based on Cr of 1.09). ------------------------------------------------------------------------------------------------------------------ No results for input(s): TSH, T4TOTAL, T3FREE, THYROIDAB in the last 72 hours.  Invalid input(s): FREET3   Coagulation profile No results for input(s): INR, PROTIME in the last 168 hours. ------------------------------------------------------------------------------------------------------------------- No results for input(s): DDIMER in the last 72 hours. -------------------------------------------------------------------------------------------------------------------  Cardiac Enzymes No results for input(s): CKMB, TROPONINI, MYOGLOBIN in the last 168 hours.  Invalid input(s): CK ------------------------------------------------------------------------------------------------------------------ Invalid input(s): POCBNP   ---------------------------------------------------------------------------------------------------------------  Urinalysis  Component Value Date/Time   COLORURINE YELLOW 06/15/2015 0111   APPEARANCEUR CLEAR 06/15/2015 0111   LABSPEC 1.025 06/15/2015 0111   PHURINE 6.0 06/15/2015 0111   GLUCOSEU NEGATIVE 06/15/2015 0111   HGBUR NEGATIVE 06/15/2015 0111   BILIRUBINUR NEGATIVE 06/15/2015 0111   KETONESUR 15* 06/15/2015 0111   PROTEINUR TRACE* 06/15/2015 0111   NITRITE NEGATIVE 06/15/2015 0111   LEUKOCYTESUR TRACE* 06/15/2015 0111     ----------------------------------------------------------------------------------------------------------------  Imaging results:   Dg Chest 2 View  07/20/2015  CLINICAL DATA:  Shortness of breath. EXAM: CHEST  2 VIEW COMPARISON:  March 21, 2015 FINDINGS: No pneumothorax. A large hiatal hernia is again identified. The heart, hila, and mediastinum are unchanged. There are new opacities in the lungs which have increased in the interval. The opacities are more focal in the right perihilar region and rounded/somewhat masslike in the right infrahilar region. These findings are new in the 4 month interval. The opacity is also mildly more prominent in the lateral left lung. The previous study suggested an underlying interstitial process such as fibrosis which persists. No other interval changes or acute abnormalities. IMPRESSION: 1. New bilateral focal pulmonary opacities. The most prominent opacity in the right infrahilar region is rounded and somewhat masslike. Given that these findings are new in the 4 month interval, they may represent an infectious or inflammatory process. A CT scan may better evaluate. At the least, recommend treatment with a short-term follow-up to ensure resolution. Electronically Signed   By: Dorise Bullion III M.D   On: 07/20/2015 14:34   Ct Chest High Resolution  07/20/2015  CLINICAL DATA:  Shortness of breath. Mediastinal adenopathy. COPD. Ex-smoker. EXAM: CT CHEST WITHOUT CONTRAST TECHNIQUE: Multidetector CT imaging of the chest was performed following the standard protocol without intravenous contrast. High resolution imaging of the lungs, as well as inspiratory and expiratory imaging, was performed. COMPARISON:  Multiple chest radiographs including back to 07/28/2010. No prior CT. FINDINGS: Mediastinum/Lymph Nodes: Tortuous descending thoracic aorta. Normal heart size, without pericardial effusion. Right paratracheal adenopathy at 3.1 x 3.2 cm in image 21/series 3. Subcarinal  node measures 2.7 cm on image 27/series 3. Prevascular adenopathy including at 1.3 cm. Complex hiatal hernia, containing the majority of the stomach. No complicating obstruction. Lungs/Pleura: Small right-sided pleural effusion. Right upper lobe pleural-based pulmonary nodule which measures 1.7 x 1.4 cm on image 20/series 10. Minimal motion degradation inferiorly. Right minor fissure thickening/ nodularity at 6 mm on image 25/series 10. Superior segment right lower lobe pulmonary nodule which measures 7 mm on image 22/series 10. Central right middle lobe lung mass with extension into the right upper lobe. This measures 4.7 by 4.6 cm on image 32/series 10 and coronal image 42. Mild left-sided pulmonary nodularity, including on the order of 2 mm in the left lower lobe on image 20/ series 10. High-resolution images demonstrate subpleural reticulation without basilar predominance. No honeycombing. No ground-glass opacity or micro nodularity. Expiratory images demonstrate no significant air trapping. Upper abdomen: Heterogeneous density throughout the liver, most consistent with extensive metastatic disease. Example vague medial segment left liver lobe 4.0 x 3.9 cm lesion on image 50/series 3. Normal imaged portions of the spleen, adrenal glands, and pancreas. Musculoskeletal: Osteopenia. Possible heterogeneous density throughout the sternal manubrium on sagittal image 50. IMPRESSION: 1. Findings most consistent with stage IV primary bronchogenic carcinoma. Multiple lung nodules with a dominant right middle lobe lung mass. Thoracic adenopathy with innumerable liver lesions. 2. Small right pleural effusion. 3. Osteopenia. Heterogeneous density in the sternal manubrium  could relate to heterogeneous osteopenia or infiltrative osseous metastasis. 4. Interstitial lung disease which may represent nonspecific interstitial pneumonitis. 5. Complex large hiatal hernia, without complicating obstruction. These results will be called  to the ordering clinician or representative by the Radiologist Assistant, and communication documented in the PACS or zVision Dashboard. Electronically Signed   By: Abigail Miyamoto M.D.   On: 07/20/2015 17:08      Assessment & Plan  Active Problems:   Hyperlipemia   Essential hypertension   Hypoxia   COPD exacerbation (HCC)   Lung mass  COPD exacerbation - Patient presents with worsening dyspnea, cough, productive, with scattered wheezing, she will be admitted for COPD exacerbation, will start on IV Solu-Medrol, will start on DuoNeb every 6 hours, when necessary albuterol, will continue   Spiriva, given she has productive cough will continue with IV Rocephin and azithromycin.  CT chest findings suggestive of stage IV lung cancer - Status with patient and son CT findings, will discuss with pulmonary service in a.m. regarding further workup.  Hypertension -Continue with home medication  Hyperlipidemia -Continue with fish oil   DVT Prophylaxis Lovenox -  AM Labs Ordered, also please review Full Orders  Family Communication: Admission, patients condition and plan of care including tests being ordered have been discussed with the patient and son who indicate understanding and agree with the plan and Code Status.  Code Status Full  Likely DC to  home  Condition GUARDED    Time spent in minutes : 45 minutes    Santrice Muzio M.D on 07/20/2015 at 6:09 PM  Between 7am to 7pm - Pager - [phone]  After 7pm go to [email or web address] - password [email or web address]  And look for the night coverage person covering me after hours  [facility] Office  [phone]

## 2015-07-20 NOTE — ED Notes (Signed)
Attempted x 2 without success. During attempts pt noted to have decreased 02 sats of 88% in bed at rest.

## 2015-07-20 NOTE — ED Notes (Signed)
IV team at bedside 

## 2015-07-20 NOTE — ED Notes (Signed)
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

## 2015-07-20 NOTE — ED Notes (Signed)
Manuela Schwartz attempt IV with and without US guide; Kirke Shaggy and myself have attempted IV as well; IV consult placed.

## 2015-07-20 NOTE — ED Notes (Signed)
Browning at bedside to attempt US guided IV.

## 2015-07-20 NOTE — ED Notes (Addendum)
Per [facility] charge nurse Marlon Pel reports provider will attempt IV; do not have IV team or other staff attempt IV. IV team notified.

## 2015-07-20 NOTE — ED Provider Notes (Signed)
CSN: [id number]     Arrival date & time 07/20/15  1348 History   First MD Initiated Contact with Patient 07/20/15 1505     Chief Complaint  Patient presents with  . COPD     (Consider location/radiation/quality/duration/timing/severity/associated sxs/prior Treatment) HPI Comments: Patient presents to the ED with a chief complaint of SOB.  She states that she has been having worsening SOB for the past several days.  She reports associated productive cough.  States that the sputum is "so thick I can't spit it out, I have to wipe it out of my mouth."  She denies chest pain, but states that she has pain in the left side of her back and left lateral ribs.  She states that she has recently been diagnosed with pulmonary fibrosis by Dr. Annamaria Boots with Pulmonology.  She states that she also has severe COPD and has to use multiple breathing treatments and rescue inhalers daily.  She states that she was seen by the pulmonologist a couple of days ago and was given a steriod shot.  She states that normally this clears her up, but this time it hasn't worked.  She denies any associated fever.  There are no modifying factors.  The history is provided by the patient. No language interpreter was used.    Past Medical History  Diagnosis Date  . Chronic airway obstruction, not elsewhere classified   . Unspecified sinusitis (chronic)   . Diaphragmatic hernia without mention of obstruction or gangrene   . Macular degeneration (senile) of retina, unspecified   . Other and unspecified hyperlipidemia   . Unspecified essential hypertension   . Unspecified disorder resulting from impaired renal function   . Mediastinal adenopathy    Past Surgical History  Procedure Laterality Date  . Arm fracture    . Cholecystectomy    . Total abdominal hysterectomy    . Bilateral salpingoophorectomy    . Incisional hernia repair      abdominal  . Appendectomy    . Cataract extraction     Family History  Problem Relation Age  of Onset  . Emphysema Father   . Heart disease Mother   . Lung cancer Father    Social History  Substance Use Topics  . Smoking status: Former Smoker    Quit date: 11/14/1989  . Smokeless tobacco: None  . Alcohol Use: No   OB History    No data available     Review of Systems  All other systems reviewed and are negative.     Allergies  Moxifloxacin; Penicillins; Pravastatin sodium; and Quinolones  Home Medications   Prior to Admission medications   Medication Sig Start Date End Date Taking? Authorizing Provider  acetaminophen (TYLENOL) 500 MG tablet Take 500 mg by mouth every 6 (six) hours as needed for mild pain or moderate pain.    Historical Provider, MD  albuterol (PROVENTIL) (2.5 MG/3ML) 0.083% nebulizer solution Take 2.5 mg by nebulization every 6 (six) hours as needed for wheezing or shortness of breath.  04/01/15   Historical Provider, MD  aspirin 325 MG EC tablet Take 325 mg by mouth daily.      Historical Provider, MD  furosemide (LASIX) 40 MG tablet Take 40 mg by mouth daily.      Historical Provider, MD  Ginger, Zingiber officinalis, (GINGER ROOT) 500 MG CAPS Take 2 capsules by mouth daily.      Historical Provider, MD  loperamide (IMODIUM A-D) 2 MG tablet Take 2 mg by mouth 4 (  four) times daily as needed for diarrhea or loose stools.    Historical Provider, MD  losartan (COZAAR) 50 MG tablet Take 1 tablet by mouth daily. 09/07/12   Historical Provider, MD  Multiple Vitamin (MULTIVITAMIN WITH MINERALS) TABS tablet Take 1 tablet by mouth daily.    Historical Provider, MD  Omega-3 Fatty Acids (FISH OIL) 1000 MG CAPS Take 1 capsule by mouth daily.    Historical Provider, MD  ondansetron (ZOFRAN ODT) 8 MG disintegrating tablet Take 1 tablet (8 mg total) by mouth every 8 (eight) hours as needed for nausea or vomiting. 06/15/15   Evalee Jefferson, PA-C  potassium chloride (K-DUR,KLOR-CON) 10 MEQ tablet Take 10 mEq by mouth daily. 06/12/15   Historical Provider, MD  PROAIR HFA 108  (90 BASE) MCG/ACT inhaler INHALE 2 PUFFS INTO THE LUNGS EVERY 4 (FOUR) HOURS AS NEEDED FOR WHEEZING OR SHORTNESS OF BREATH. 04/19/15   Deneise Lever, MD  Tiotropium Bromide Monohydrate (SPIRIVA RESPIMAT) 2.5 MCG/ACT AERS Inhale 2 puffs into the lungs daily. 05/17/15   Deneise Lever, MD   BP 122/62 mmHg  Pulse 96  Temp(Src) 98.1 F (36.7 C) (Oral)  Resp 26  SpO2 93% Physical Exam  Constitutional: She is oriented to person, place, and time. She appears well-developed and well-nourished.  HENT:  Head: Normocephalic and atraumatic.  Eyes: Conjunctivae and EOM are normal. Pupils are equal, round, and reactive to light.  Neck: Normal range of motion. Neck supple.  Cardiovascular: Normal rate and regular rhythm.  Exam reveals no gallop and no friction rub.   No murmur heard. Pulmonary/Chest: Effort normal and breath sounds normal. No respiratory distress. She has no wheezes. She has no rales. She exhibits no tenderness.  Bilateral crackles No wheezing  Abdominal: Soft. Bowel sounds are normal. She exhibits no distension and no mass. There is no tenderness. There is no rebound and no guarding.  Musculoskeletal: Normal range of motion. She exhibits no edema or tenderness.  Neurological: She is alert and oriented to person, place, and time.  Skin: Skin is warm and dry.  Psychiatric: She has a normal mood and affect. Her behavior is normal. Judgment and thought content normal.  Nursing note and vitals reviewed.   ED Course  Procedures (including critical care time) Labs Review Labs Reviewed  BASIC METABOLIC PANEL - Abnormal; Notable for the following:    Glucose, Bld 105 (*)    BUN 21 (*)    Creatinine, Ser 1.09 (*)    GFR calc non Af Amer 45 (*)    GFR calc Af Amer 52 (*)    All other components within normal limits  CBC - Abnormal; Notable for the following:    Hemoglobin 15.1 (*)    All other components within normal limits    Imaging Review Dg Chest 2 View  07/20/2015   CLINICAL DATA:  Shortness of breath. EXAM: CHEST  2 VIEW COMPARISON:  March 21, 2015 FINDINGS: No pneumothorax. A large hiatal hernia is again identified. The heart, hila, and mediastinum are unchanged. There are new opacities in the lungs which have increased in the interval. The opacities are more focal in the right perihilar region and rounded/somewhat masslike in the right infrahilar region. These findings are new in the 4 month interval. The opacity is also mildly more prominent in the lateral left lung. The previous study suggested an underlying interstitial process such as fibrosis which persists. No other interval changes or acute abnormalities. IMPRESSION: 1. New bilateral focal pulmonary opacities. The  most prominent opacity in the right infrahilar region is rounded and somewhat masslike. Given that these findings are new in the 4 month interval, they may represent an infectious or inflammatory process. A CT scan may better evaluate. At the least, recommend treatment with a short-term follow-up to ensure resolution. Electronically Signed   By: Dorise Bullion III M.D   On: 07/20/2015 14:34   I have personally reviewed and evaluated these images and lab results as part of my medical decision-making.   EKG Interpretation   Date/Time:  Saturday July 20 2015 14:08:19 EST Ventricular Rate:  99 PR Interval:  151 QRS Duration: 84 QT Interval:  342 QTC Calculation: 439 R Axis:   118 Text Interpretation:  Sinus rhythm Low voltage with right axis deviation  Probable anteroseptal infarct, old Baseline wander in lead(s) V2 No  significant change since last tracing Confirmed by BEATON  MD, Taralynn Quiett  ([id number]) on 07/20/2015 3:40:54 PM      MDM   Final diagnoses:  SOB (shortness of breath)    Patient with SOB and productive cough.  CXR ordered in triage concerning for infectious vs inflammatory process.    Patient discussed with Dr. Audie Pinto, who agrees with plan for admission.  Recommend pharmacy  consult for antibiotics.  Discussed with pharmacy, who advises azithro and rocephin.  Doesn't have severe pen allergy.  Is from home.  Treat for CAP.  Patient desats intermittently.  Will require admission.  Cannot take more than a few steps without becoming profoundly SOB.  Oxygen saturation has dropped to 88% at rest. Myself and the nurse who witnessed this several times. Discussed with Dr. Waldron Labs, who recommends admission for hypoxia, at this time pneumonia is thought to be less likely.    Montine Circle, PA-C 07/20/15 1741  Leonard Schwartz, MD 07/22/15 1515

## 2015-07-20 NOTE — ED Provider Notes (Signed)
Procedure note: Ultrasound Guided Peripheral IV Ultrasound guided peripheral 1.88 inch angiocath IV placement performed by me. Indications: Nursing unable to place IV. Details: The antecubital fossa and upper arm were evaluated with a multifrequency linear probe. Patent brachial veins were noted. 1 attempt was made to cannulate a vein under realtime US guidance with successful cannulation of the vein and catheter placement. There is return of non-pulsatile dark red blood. The patient tolerated the procedure well without complications. Images archived electronically.  CPT codes: [phone] and 16553   Leo Grosser, MD 07/20/15 [phone]

## 2015-07-20 NOTE — ED Notes (Signed)
PT CAN GO TO FLOOR AT 1820

## 2015-07-21 LAB — BASIC METABOLIC PANEL
Anion gap: 10 (ref 5–15)
BUN: 19 mg/dL (ref 6–20)
CALCIUM: 9.6 mg/dL (ref 8.9–10.3)
CO2: 20 mmol/L — AB (ref 22–32)
CREATININE: 0.85 mg/dL (ref 0.44–1.00)
Chloride: 110 mmol/L (ref 101–111)
GFR calc non Af Amer: >60 mL/min (ref >60)
GLUCOSE: 146 mg/dL — AB (ref 65–99)
Potassium: 5 mmol/L (ref 3.5–5.1)
Sodium: 140 mmol/L (ref 135–145)

## 2015-07-21 LAB — CBC
HCT: 41 % (ref 36.0–46.0)
Hemoglobin: 13.4 g/dL (ref 12.0–15.0)
MCH: 29.3 pg (ref 26.0–34.0)
MCHC: 32.7 g/dL (ref 30.0–36.0)
MCV: 89.7 fL (ref 78.0–100.0)
PLATELETS: 175 10*3/uL (ref 150–400)
RBC: 4.57 MIL/uL (ref 3.87–5.11)
RDW: 14.9 % (ref 11.5–15.5)
WBC: 3.5 10*3/uL — ABNORMAL LOW (ref 4.0–10.5)

## 2015-07-21 MED ORDER — ENSURE ENLIVE PO LIQD
237.0000 mL | Freq: Three times a day (TID) | ORAL | Status: DC
  Administered 2015-07-21 – 2015-07-23 (×6): 237 mL via ORAL

## 2015-07-21 NOTE — Evaluation (Signed)
Physical Therapy Evaluation Patient Details Name: Meghan Castro MRN: [id number] DOB: 17-Dec-1928 Today's Date: 07/21/2015   History of Present Illness  Patient presents to the ED with a chief complaint of SOB. She states that she has been having worsening SOB for the past several days. She reports associated productive cough. CT chest came back significant with multiple lung masses history of stage IV pulmonary lung cancer, given patient's hypoxia, and significant dyspnea.  Clinical Impression  Yhe patient presents with generalized weakness and DOE. Sats were low 90 on RA but ambulated 25' only. Sisters present , has family support at home. May need to try on Oxygen to increase activity tolerance. Pt admitted with above diagnosis. Pt currently with functional limitations due to the deficits listed below (see PT Problem List).  Pt will benefit from skilled PT to increase their independence and safety with mobility to allow discharge to home.   Follow Up Recommendations Home health PT;Supervision/Assistance - 24 hour    Equipment Recommendations  Rolling walker with 5" wheels    Recommendations for Other Services OT consult     Precautions / Restrictions Precautions Precautions: Fall Precaution Comments: monitor VS, may need O2      Mobility  Bed Mobility Overal bed mobility: Needs Assistance Bed Mobility: Supine to Sit           General bed mobility comments: extra time  Transfers Overall transfer level: Needs assistance Equipment used: 1 person [place] held assist Transfers: Sit to/from Stand           General transfer comment: steady assist to stand  Ambulation/Gait Ambulation/Gait assistance: Min assist;Mod assist;+2 safety/equipment Ambulation Distance (Feet): 25 Feet Assistive device: 1 person [place] held assist Gait Pattern/deviations: Decreased stride length;Trunk flexed     General Gait Details: unsteady gait, very limited by SOB,will do best with RW and nmaty  need RW.  Stairs            Wheelchair Mobility    Modified Rankin (Stroke Patients Only)       Balance Overall balance assessment: Needs assistance Sitting-balance support: Feet supported;No upper extremity supported Sitting balance-Leahy Scale: Fair     Standing balance support: During functional activity;Bilateral upper extremity supported Standing balance-Leahy Scale: Poor                               Pertinent Vitals/Pain Pain Assessment: No/denies pain    Home Living Family/patient expects to be discharged to:: Private residence Living Arrangements: Alone Available Help at Discharge: Family;Available 24 hours/day Type of Home: House Home Access: Stairs to enter   [employer] of Steps: 2 Home Layout: One level Home Equipment: Cane - single point      Prior Function Level of Independence: Independent               Doyel Dominance        Extremity/Trunk Assessment   Upper Extremity Assessment: Generalized weakness           Lower Extremity Assessment: Generalized weakness      Cervical / Trunk Assessment: Kyphotic  Communication   Communication: No difficulties  Cognition Arousal/Alertness: Awake/alert Behavior During Therapy: WFL for tasks assessed/performed Overall Cognitive Status: Within Functional Limits for tasks assessed                      General Comments      Exercises  Assessment/Plan    PT Assessment Patient needs continued PT services  PT Diagnosis Difficulty walking   PT Problem List Decreased strength;Decreased activity tolerance;Decreased balance;Decreased mobility;Cardiopulmonary status limiting activity;Decreased knowledge of precautions;Decreased safety awareness;Decreased knowledge of use of DME  PT Treatment Interventions DME instruction;Gait training;Functional mobility training;Therapeutic activities;Therapeutic exercise;Patient/family education   PT Goals  (Current goals can be found in the Care Plan section) Acute Rehab PT Goals Patient Stated Goal: to go home PT Goal Formulation: With patient/family Time For Goal Achievement: 08/04/15 Potential to Achieve Goals: Good    Frequency Min 3X/week   Barriers to discharge        Co-evaluation               End of Session   Activity Tolerance: Patient limited by fatigue;Treatment limited secondary to medical complications (Comment) Patient left: in chair;with call bell/phone within reach;with chair alarm set;with family/visitor present Nurse Communication: Mobility status         Time: 8372-9021 PT Time Calculation (min) (ACUTE ONLY): 12 min   Charges:   PT Evaluation $PT Eval Low Complexity: 1 Procedure     PT G CodesClaretha Cooper 07/21/2015, 12:30 PM [name] PT [phone]

## 2015-07-21 NOTE — Progress Notes (Addendum)
Patient Demographics  Meghan Castro, is a 82 y.o. female, DOB - Jul 25, 1928, JEH:[id number]  Admit date - 07/20/2015   Admitting Physician Albertine Patricia, MD  Outpatient Primary MD for the patient is [name], MD  LOS - 1   Chief Complaint  Patient presents with  . COPD       Admission HPI/Brief narrative: 82 y.o. female, with past medical history of COPD, hypertension, large hiatal hernia, macular degeneration, and renal insufficiency, patient presents to ED with complaints of worsening dyspnea over the last few days, workup significant for COPD, as well as CT chest significant for stage IV pulmonary lung cancer admitted for further workup.  Subjective:   Meghan Castro today has, No headache, No chest pain, No abdominal pain - post dyspnea is improving , reports productive cough . Assessment & Plan    Active Problems:   Hyperlipemia   Essential hypertension   Hypoxia   COPD exacerbation (HCC)   Lung mass  COPD exacerbation  - Agent with known history of COPD, presents with worsening dyspnea, wheezing significantly improved, continue with IV steroids, continue with nebulizer treatment . - Given she has productive cough continue with IV Rocephin and azithromycin   CT chest findings suggestive of stage IV lung cancer - As cost with pulmonary, very likely she will need bronchoscopy for biopsy.  Hypertension -Continue with home medication, will hold losartan today given elevated potassium at 5, will recheck level in a.m.  Hyperlipidemia -Continue with fish oil  Code Status: Full  Family Communication: Sister  at bedside  Disposition Plan: Home once stable   Procedures  None   Consults   Pulmonary    Medications  Scheduled Meds: . azithromycin  500 mg Intravenous Q24H  . cefTRIAXone (ROCEPHIN)  IV  1 g Intravenous Q24H  . feeding supplement (ENSURE ENLIVE)  237 mL  Oral TID BM  . guaiFENesin  600 mg Oral BID  . heparin  5,000 Units Subcutaneous 3 times per day  . ipratropium-albuterol  3 mL Nebulization TID  . methylPREDNISolone (SOLU-MEDROL) injection  40 mg Intravenous 3 times per day  . multivitamin with minerals  1 tablet Oral Daily  . omega-3 acid ethyl esters  1 g Oral Daily  . pantoprazole  40 mg Oral Daily  . potassium chloride  10 mEq Oral Daily  . senna  1 tablet Oral BID  . tiotropium  1 capsule Inhalation Daily   Continuous Infusions: . sodium chloride 75 mL/hr at 07/20/15 2020   PRN Meds:.acetaminophen, albuterol, HYDROcodone-acetaminophen, ondansetron **OR** ondansetron (ZOFRAN) IV  DVT Prophylaxis  Heparin  Lab Results  Component Value Date   PLT 175 07/21/2015    Antibiotics    Anti-infectives    Start     Dose/Rate Route Frequency Ordered Stop   07/21/15 2000  azithromycin (ZITHROMAX) 500 mg in dextrose 5 % 250 mL IVPB     500 mg 250 mL/hr over 60 Minutes Intravenous Every 24 hours 07/20/15 1946     07/21/15 1800  cefTRIAXone (ROCEPHIN) 1 g in dextrose 5 % 50 mL IVPB     1 g 100 mL/hr over 30 Minutes Intravenous Every 24 hours 07/20/15 1946     07/20/15 2000  azithromycin (ZITHROMAX) 500 mg  in dextrose 5 % 250 mL IVPB     500 mg 250 mL/hr over 60 Minutes Intravenous  Once 07/20/15 1901 07/20/15 2117   07/20/15 1545  cefTRIAXone (ROCEPHIN) 1 g in dextrose 5 % 50 mL IVPB     1 g 100 mL/hr over 30 Minutes Intravenous  Once 07/20/15 1543 07/20/15 1850   07/20/15 1545  azithromycin (ZITHROMAX) 500 mg in dextrose 5 % 250 mL IVPB  Status:  Discontinued     500 mg 250 mL/hr over 60 Minutes Intravenous  Once 07/20/15 1543 07/20/15 1901          Objective:   Filed Vitals:   07/20/15 1855 07/20/15 2012 07/21/15 0617 07/21/15 0856  BP: 147/68 118/54 126/65   Pulse: 100 92 95   Temp: 98.3 F (36.8 C) 98.1 F (36.7 C) 98.3 F (36.8 C)   TempSrc: Oral Oral Oral   Resp:  20 20   Height: '5\' 2"'$  (1.575 m)     SpO2:  96% 92% 92% 94%    Wt Readings from Last 3 Encounters:  07/16/15 74.027 kg (163 lb 3.2 oz)  06/14/15 75.07 kg (165 lb 8 oz)  05/17/15 76.295 kg (168 lb 3.2 oz)    No intake or output data in the 24 hours ending 07/21/15 1331   Physical Exam  Awake Alert, Oriented X 3, No new F.N deficits, Normal affect [place].AT,PERRAL Supple Neck,No JVD, Symmetrical Chest wall movement, Good air movement bilaterally,  RRR,No Gallops,Rubs or new Murmurs, No Parasternal Heave +ve B.Sounds, Abd Soft, No tenderness, No organomegaly appriciated, No rebound - guarding or rigidity. No Cyanosis, Clubbing or edema, No new Rash or bruise     Data Review   Micro Results No results found for this or any previous visit (from the past 240 hour(s)).  Radiology Reports Dg Chest 2 View  07/20/2015  CLINICAL DATA:  Shortness of breath. EXAM: CHEST  2 VIEW COMPARISON:  March 21, 2015 FINDINGS: No pneumothorax. A large hiatal hernia is again identified. The heart, hila, and mediastinum are unchanged. There are new opacities in the lungs which have increased in the interval. The opacities are more focal in the right perihilar region and rounded/somewhat masslike in the right infrahilar region. These findings are new in the 4 month interval. The opacity is also mildly more prominent in the lateral left lung. The previous study suggested an underlying interstitial process such as fibrosis which persists. No other interval changes or acute abnormalities. IMPRESSION: 1. New bilateral focal pulmonary opacities. The most prominent opacity in the right infrahilar region is rounded and somewhat masslike. Given that these findings are new in the 4 month interval, they may represent an infectious or inflammatory process. A CT scan may better evaluate. At the least, recommend treatment with a short-term follow-up to ensure resolution. Electronically Signed   By: Dorise Bullion III M.D   On: 07/20/2015 14:34   Ct Chest High  Resolution  07/20/2015  CLINICAL DATA:  Shortness of breath. Mediastinal adenopathy. COPD. Ex-smoker. EXAM: CT CHEST WITHOUT CONTRAST TECHNIQUE: Multidetector CT imaging of the chest was performed following the standard protocol without intravenous contrast. High resolution imaging of the lungs, as well as inspiratory and expiratory imaging, was performed. COMPARISON:  Multiple chest radiographs including back to 07/28/2010. No prior CT. FINDINGS: Mediastinum/Lymph Nodes: Tortuous descending thoracic aorta. Normal heart size, without pericardial effusion. Right paratracheal adenopathy at 3.1 x 3.2 cm in image 21/series 3. Subcarinal node measures 2.7 cm on image 27/series 3.  Prevascular adenopathy including at 1.3 cm. Complex hiatal hernia, containing the majority of the stomach. No complicating obstruction. Lungs/Pleura: Small right-sided pleural effusion. Right upper lobe pleural-based pulmonary nodule which measures 1.7 x 1.4 cm on image 20/series 10. Minimal motion degradation inferiorly. Right minor fissure thickening/ nodularity at 6 mm on image 25/series 10. Superior segment right lower lobe pulmonary nodule which measures 7 mm on image 22/series 10. Central right middle lobe lung mass with extension into the right upper lobe. This measures 4.7 by 4.6 cm on image 32/series 10 and coronal image 42. Mild left-sided pulmonary nodularity, including on the order of 2 mm in the left lower lobe on image 20/ series 10. High-resolution images demonstrate subpleural reticulation without basilar predominance. No honeycombing. No ground-glass opacity or micro nodularity. Expiratory images demonstrate no significant air trapping. Upper abdomen: Heterogeneous density throughout the liver, most consistent with extensive metastatic disease. Example vague medial segment left liver lobe 4.0 x 3.9 cm lesion on image 50/series 3. Normal imaged portions of the spleen, adrenal glands, and pancreas. Musculoskeletal: Osteopenia.  Possible heterogeneous density throughout the sternal manubrium on sagittal image 50. IMPRESSION: 1. Findings most consistent with stage IV primary bronchogenic carcinoma. Multiple lung nodules with a dominant right middle lobe lung mass. Thoracic adenopathy with innumerable liver lesions. 2. Small right pleural effusion. 3. Osteopenia. Heterogeneous density in the sternal manubrium could relate to heterogeneous osteopenia or infiltrative osseous metastasis. 4. Interstitial lung disease which may represent nonspecific interstitial pneumonitis. 5. Complex large hiatal hernia, without complicating obstruction. These results will be called to the ordering clinician or representative by the Radiologist Assistant, and communication documented in the PACS or zVision Dashboard. Electronically Signed   By: Abigail Miyamoto M.D.   On: 07/20/2015 17:08     CBC  Recent Labs Lab 07/20/15 1435 07/21/15 0530  WBC 8.8 3.5*  HGB 15.1* 13.4  HCT 46.0 41.0  PLT 239 175  MCV 90.7 89.7  MCH 29.8 29.3  MCHC 32.8 32.7  RDW 14.9 14.9    Chemistries   Recent Labs Lab 07/20/15 1435 07/21/15 0530  NA 140 140  K 4.7 5.0  CL 107 110  CO2 22 20*  GLUCOSE 105* 146*  BUN 21* 19  CREATININE 1.09* 0.85  CALCIUM 9.7 9.6   ------------------------------------------------------------------------------------------------------------------ estimated creatinine clearance is 46.4 mL/min (by C-G formula based on Cr of 0.85). ------------------------------------------------------------------------------------------------------------------ No results for input(s): HGBA1C in the last 72 hours. ------------------------------------------------------------------------------------------------------------------ No results for input(s): CHOL, HDL, LDLCALC, TRIG, CHOLHDL, LDLDIRECT in the last 72 hours. ------------------------------------------------------------------------------------------------------------------ No results for  input(s): TSH, T4TOTAL, T3FREE, THYROIDAB in the last 72 hours.  Invalid input(s): FREET3 ------------------------------------------------------------------------------------------------------------------ No results for input(s): VITAMINB12, FOLATE, FERRITIN, TIBC, IRON, RETICCTPCT in the last 72 hours.  Coagulation profile No results for input(s): INR, PROTIME in the last 168 hours.  No results for input(s): DDIMER in the last 72 hours.  Cardiac Enzymes No results for input(s): CKMB, TROPONINI, MYOGLOBIN in the last 168 hours.  Invalid input(s): CK ------------------------------------------------------------------------------------------------------------------ Invalid input(s): POCBNP     Time Spent in minutes   25 minutes   Yechezkel Fertig M.D on 07/21/2015 at 1:31 PM  Between 7am to 7pm - Pager - [phone]  After 7pm go to [email or web address] - password [facility]  [facility]   Office  [phone]

## 2015-07-22 ENCOUNTER — Telehealth: Payer: Self-pay | Admitting: Internal Medicine

## 2015-07-22 DIAGNOSIS — R918 Other nonspecific abnormal finding of lung field: Secondary | ICD-10-CM

## 2015-07-22 DIAGNOSIS — J441 Chronic obstructive pulmonary disease with (acute) exacerbation: Secondary | ICD-10-CM

## 2015-07-22 LAB — BASIC METABOLIC PANEL
ANION GAP: 8 (ref 5–15)
BUN: 23 mg/dL — ABNORMAL HIGH (ref 6–20)
CHLORIDE: 111 mmol/L (ref 101–111)
CO2: 19 mmol/L — AB (ref 22–32)
Calcium: 9.3 mg/dL (ref 8.9–10.3)
Creatinine, Ser: 0.81 mg/dL (ref 0.44–1.00)
GFR calc Af Amer: >60 mL/min (ref >60)
GLUCOSE: 128 mg/dL — AB (ref 65–99)
POTASSIUM: 4.8 mmol/L (ref 3.5–5.1)
Sodium: 138 mmol/L (ref 135–145)

## 2015-07-22 MED ORDER — RISAQUAD PO CAPS
2.0000 | ORAL_CAPSULE | Freq: Every day | ORAL | Status: DC
Start: 2015-07-22 — End: 2015-07-24
  Administered 2015-07-22 – 2015-07-23 (×2): 2 via ORAL
  Filled 2015-07-22 (×3): qty 2

## 2015-07-22 MED ORDER — METHYLPREDNISOLONE SODIUM SUCC 40 MG IJ SOLR
40.0000 mg | Freq: Two times a day (BID) | INTRAMUSCULAR | Status: DC
  Administered 2015-07-22 – 2015-07-24 (×4): 40 mg via INTRAVENOUS
  Filled 2015-07-22 (×4): qty 1

## 2015-07-22 NOTE — Progress Notes (Signed)
Patient Demographics  Meghan Castro, is a 82 y.o. female, DOB - 1928-11-29, BHA:[id number]  Admit date - 07/20/2015   Admitting Physician Albertine Patricia, MD  Outpatient Primary MD for the patient is [name], MD  LOS - 2   Chief Complaint  Patient presents with  . COPD       Admission HPI/Brief narrative: 82 y.o. female, with past medical history of COPD, hypertension, large hiatal hernia, macular degeneration, and renal insufficiency, patient presents to ED with complaints of worsening dyspnea over the last few days, workup significant for COPD, as well as CT chest significant for stage IV pulmonary lung cancer admitted for further workup, COPD improving on IV Solu-Medrol, frequent discontinued as no evidence of pneumonia, and by pulmonary with plan for bronchoscopy with the BUS on Wednesday.  Subjective:   Meghan Castro today has, No headache, No chest pain, No abdominal pain -as needed back at baseline , coarse cough, currently nonproductive . Assessment & Plan    Active Problems:   Hyperlipemia   Essential hypertension   Hypoxia   COPD exacerbation (HCC)   Lung mass  COPD exacerbation  -Patient  with known history of COPD, presents with worsening dyspnea, wheezing significantly improved, continue with IV steroids, continue with nebulizer treatment . -Cough is currently nonproductive, will discontinue  IV Rocephin and azithromycin   CT chest findings suggestive of stage IV lung cancer -Pulmonary consult appreciated, plan is for bronchoscopy with EBUS this coming Wednesday   Hypertension -Continue with home medication, will hold losartan today given elevated potassium at 5, will recheck level in a.m.  Hyperlipidemia -Continue with fish oil  Code Status: Full  Family Communication:  son and Sister  at bedside  Disposition Plan: Home once stable   Procedures   None   Consults   Pulmonary    Medications  Scheduled Meds: . acidophilus  2 capsule Oral Daily  . feeding supplement (ENSURE ENLIVE)  237 mL Oral TID BM  . guaiFENesin  600 mg Oral BID  . heparin  5,000 Units Subcutaneous 3 times per day  . ipratropium-albuterol  3 mL Nebulization TID  . methylPREDNISolone (SOLU-MEDROL) injection  40 mg Intravenous 3 times per day  . multivitamin with minerals  1 tablet Oral Daily  . omega-3 acid ethyl esters  1 g Oral Daily  . pantoprazole  40 mg Oral Daily  . potassium chloride  10 mEq Oral Daily  . tiotropium  1 capsule Inhalation Daily   Continuous Infusions: . sodium chloride 75 mL/hr at 07/22/15 1102   PRN Meds:.acetaminophen, albuterol, HYDROcodone-acetaminophen, ondansetron **OR** ondansetron (ZOFRAN) IV  DVT Prophylaxis  Heparin  Lab Results  Component Value Date   PLT 175 07/21/2015    Antibiotics    Anti-infectives    Start     Dose/Rate Route Frequency Ordered Stop   07/21/15 2000  azithromycin (ZITHROMAX) 500 mg in dextrose 5 % 250 mL IVPB  Status:  Discontinued     500 mg 250 mL/hr over 60 Minutes Intravenous Every 24 hours 07/20/15 1946 07/22/15 0908   07/21/15 1800  cefTRIAXone (ROCEPHIN) 1 g in dextrose 5 % 50 mL IVPB  Status:  Discontinued     1 g 100 mL/hr over 30 Minutes Intravenous  Every 24 hours 07/20/15 1946 07/22/15 0908   07/20/15 2000  azithromycin (ZITHROMAX) 500 mg in dextrose 5 % 250 mL IVPB     500 mg 250 mL/hr over 60 Minutes Intravenous  Once 07/20/15 1901 07/20/15 2117   07/20/15 1545  cefTRIAXone (ROCEPHIN) 1 g in dextrose 5 % 50 mL IVPB     1 g 100 mL/hr over 30 Minutes Intravenous  Once 07/20/15 1543 07/20/15 1850   07/20/15 1545  azithromycin (ZITHROMAX) 500 mg in dextrose 5 % 250 mL IVPB  Status:  Discontinued     500 mg 250 mL/hr over 60 Minutes Intravenous  Once 07/20/15 1543 07/20/15 1901          Objective:   Filed Vitals:   07/21/15 2023 07/21/15 2300 07/22/15 0506 07/22/15  0959  BP:  132/65 126/76   Pulse: 81 88 91   Temp:  98 F (36.7 C) 98.2 F (36.8 C)   TempSrc:  Oral Oral   Resp: '16 18 18   '$ Height:      Weight:      SpO2: 96% 98% 98% 96%    Wt Readings from Last 3 Encounters:  07/21/15 74 kg (163 lb 2.3 oz)  07/16/15 74.027 kg (163 lb 3.2 oz)  06/14/15 75.07 kg (165 lb 8 oz)     Intake/Output Summary (Last 24 hours) at 07/22/15 1325 Last data filed at 07/22/15 0900  Gross per 24 hour  Intake   3070 ml  Output      0 ml  Net   3070 ml     Physical Exam  Awake Alert, Oriented X 3, No new F.N deficits, Normal affect [place].AT,PERRAL Supple Neck,No JVD, Symmetrical Chest wall movement, Good air movement bilaterally,  RRR,No Gallops,Rubs or new Murmurs, No Parasternal Heave +ve B.Sounds, Abd Soft, No tenderness, No organomegaly appriciated, No rebound - guarding or rigidity. No Cyanosis, Clubbing or edema, No new Rash or bruise     Data Review   Micro Results Recent Results (from the past 240 hour(s))  Blood culture (routine x 2)     Status: None (Preliminary result)   Collection Time: 07/20/15  3:45 PM  Result Value Ref Range Status   Specimen Description BLOOD BLOOD LEFT ARM  Final   Special Requests BOTTLES DRAWN AEROBIC AND ANAEROBIC 5CC  Final   Culture   Final    NO GROWTH < 24 HOURS Performed at [facility]    Report Status PENDING  Incomplete  Blood culture (routine x 2)     Status: None (Preliminary result)   Collection Time: 07/20/15  5:11 PM  Result Value Ref Range Status   Specimen Description BLOOD BLOOD RIGHT ARM  Final   Special Requests IN PEDIATRIC BOTTLE 1.5CC  Final   Culture   Final    NO GROWTH < 24 HOURS Performed at [facility]    Report Status PENDING  Incomplete    Radiology Reports Dg Chest 2 View  07/20/2015  CLINICAL DATA:  Shortness of breath. EXAM: CHEST  2 VIEW COMPARISON:  March 21, 2015 FINDINGS: No pneumothorax. A large hiatal hernia is again identified. The heart, hila,  and mediastinum are unchanged. There are new opacities in the lungs which have increased in the interval. The opacities are more focal in the right perihilar region and rounded/somewhat masslike in the right infrahilar region. These findings are new in the 4 month interval. The opacity is also mildly more prominent in the lateral left lung.  The previous study suggested an underlying interstitial process such as fibrosis which persists. No other interval changes or acute abnormalities. IMPRESSION: 1. New bilateral focal pulmonary opacities. The most prominent opacity in the right infrahilar region is rounded and somewhat masslike. Given that these findings are new in the 4 month interval, they may represent an infectious or inflammatory process. A CT scan may better evaluate. At the least, recommend treatment with a short-term follow-up to ensure resolution. Electronically Signed   By: Dorise Bullion III M.D   On: 07/20/2015 14:34   Ct Chest High Resolution  07/20/2015  CLINICAL DATA:  Shortness of breath. Mediastinal adenopathy. COPD. Ex-smoker. EXAM: CT CHEST WITHOUT CONTRAST TECHNIQUE: Multidetector CT imaging of the chest was performed following the standard protocol without intravenous contrast. High resolution imaging of the lungs, as well as inspiratory and expiratory imaging, was performed. COMPARISON:  Multiple chest radiographs including back to 07/28/2010. No prior CT. FINDINGS: Mediastinum/Lymph Nodes: Tortuous descending thoracic aorta. Normal heart size, without pericardial effusion. Right paratracheal adenopathy at 3.1 x 3.2 cm in image 21/series 3. Subcarinal node measures 2.7 cm on image 27/series 3. Prevascular adenopathy including at 1.3 cm. Complex hiatal hernia, containing the majority of the stomach. No complicating obstruction. Lungs/Pleura: Small right-sided pleural effusion. Right upper lobe pleural-based pulmonary nodule which measures 1.7 x 1.4 cm on image 20/series 10. Minimal motion  degradation inferiorly. Right minor fissure thickening/ nodularity at 6 mm on image 25/series 10. Superior segment right lower lobe pulmonary nodule which measures 7 mm on image 22/series 10. Central right middle lobe lung mass with extension into the right upper lobe. This measures 4.7 by 4.6 cm on image 32/series 10 and coronal image 42. Mild left-sided pulmonary nodularity, including on the order of 2 mm in the left lower lobe on image 20/ series 10. High-resolution images demonstrate subpleural reticulation without basilar predominance. No honeycombing. No ground-glass opacity or micro nodularity. Expiratory images demonstrate no significant air trapping. Upper abdomen: Heterogeneous density throughout the liver, most consistent with extensive metastatic disease. Example vague medial segment left liver lobe 4.0 x 3.9 cm lesion on image 50/series 3. Normal imaged portions of the spleen, adrenal glands, and pancreas. Musculoskeletal: Osteopenia. Possible heterogeneous density throughout the sternal manubrium on sagittal image 50. IMPRESSION: 1. Findings most consistent with stage IV primary bronchogenic carcinoma. Multiple lung nodules with a dominant right middle lobe lung mass. Thoracic adenopathy with innumerable liver lesions. 2. Small right pleural effusion. 3. Osteopenia. Heterogeneous density in the sternal manubrium could relate to heterogeneous osteopenia or infiltrative osseous metastasis. 4. Interstitial lung disease which may represent nonspecific interstitial pneumonitis. 5. Complex large hiatal hernia, without complicating obstruction. These results will be called to the ordering clinician or representative by the Radiologist Assistant, and communication documented in the PACS or zVision Dashboard. Electronically Signed   By: Abigail Miyamoto M.D.   On: 07/20/2015 17:08     CBC  Recent Labs Lab 07/20/15 1435 07/21/15 0530  WBC 8.8 3.5*  HGB 15.1* 13.4  HCT 46.0 41.0  PLT 239 175  MCV 90.7  89.7  MCH 29.8 29.3  MCHC 32.8 32.7  RDW 14.9 14.9    Chemistries   Recent Labs Lab 07/20/15 1435 07/21/15 0530 07/22/15 0447  NA 140 140 138  K 4.7 5.0 4.8  CL 107 110 111  CO2 22 20* 19*  GLUCOSE 105* 146* 128*  BUN 21* 19 23*  CREATININE 1.09* 0.85 0.81  CALCIUM 9.7 9.6 9.3   ------------------------------------------------------------------------------------------------------------------ estimated creatinine  clearance is 48.7 mL/min (by C-G formula based on Cr of 0.81). ------------------------------------------------------------------------------------------------------------------ No results for input(s): HGBA1C in the last 72 hours. ------------------------------------------------------------------------------------------------------------------ No results for input(s): CHOL, HDL, LDLCALC, TRIG, CHOLHDL, LDLDIRECT in the last 72 hours. ------------------------------------------------------------------------------------------------------------------ No results for input(s): TSH, T4TOTAL, T3FREE, THYROIDAB in the last 72 hours.  Invalid input(s): FREET3 ------------------------------------------------------------------------------------------------------------------ No results for input(s): VITAMINB12, FOLATE, FERRITIN, TIBC, IRON, RETICCTPCT in the last 72 hours.  Coagulation profile No results for input(s): INR, PROTIME in the last 168 hours.  No results for input(s): DDIMER in the last 72 hours.  Cardiac Enzymes No results for input(s): CKMB, TROPONINI, MYOGLOBIN in the last 168 hours.  Invalid input(s): CK ------------------------------------------------------------------------------------------------------------------ Invalid input(s): POCBNP     Time Spent in minutes   25 minutes   Racquelle Hyser M.D on 07/22/2015 at 1:25 PM  Between 7am to 7pm - Pager - [phone]  After 7pm go to [email or web address] - password [facility]  [facility]   Office   [phone]

## 2015-07-22 NOTE — Consult Note (Signed)
PULMONARY / CRITICAL CARE MEDICINE   Name: Meghan Castro MRN: [id number] DOB: October 28, 1928    ADMISSION DATE:  07/20/2015 CONSULTATION DATE:  07/22/2015  REFERRING MD:  Elgergawy  CHIEF COMPLAINT:  Lung mass  HISTORY OF PRESENT ILLNESS:   82 y/o female followed by Dr. Annamaria Boots for COPD and chronic sinusiti who was admitted on 3/4 with worsening dyspnea, cough and sputum production.  She was noted to be hypoxemic to 88% on admission.  A CT chest was done on admission showing multiple pulmonary nodules.  Last week she had cough, mucus production, wheezing and worsening dyspnea.  She was admitted to the hospital, given antibiotics, steroids and breathing treatments and now feels better.  However she notes a progressive decline over the last several weeks.  She had a viral gastroenteritis in January and says that she has had worsening dyspnea and fatigue since then.  She uses her Stiolto and Albuterol religiously.  She has noted a left flank to lower rib pain for several days last week, this has since resolved.  PAST MEDICAL HISTORY :  She  has a past medical history of Chronic airway obstruction, not elsewhere classified; Unspecified sinusitis (chronic); Diaphragmatic hernia without mention of obstruction or gangrene; Macular degeneration (senile) of retina, unspecified; Other and unspecified hyperlipidemia; Unspecified essential hypertension; Unspecified disorder resulting from impaired renal function; and Mediastinal adenopathy.  PAST SURGICAL HISTORY: She  has past surgical history that includes arm fracture; Cholecystectomy; Total abdominal hysterectomy; Bilateral salpingoophorectomy; Incisional hernia repair; Appendectomy; and Cataract extraction.  Allergies  Allergen Reactions  . Other Shortness Of Breath    *Perfumes*  . Penicillins     Has patient had a PCN reaction causing immediate rash, facial/tongue/throat swelling, SOB or lightheadedness with hypotension: No Has patient had a PCN  reaction causing severe rash involving mucus membranes or skin necrosis: No Has patient had a PCN reaction that required hospitalization No Has patient had a PCN reaction occurring within the last 10 years: No If all of the above answers are "NO", then may proceed with Cephalosporin use.   . Pravastatin Sodium     REACTION: swelling in tounge and feet turn black  . Quinolones Other (See Comments)    hyper  . Moxifloxacin Anxiety    No current facility-administered medications on file prior to encounter.   Current Outpatient Prescriptions on File Prior to Encounter  Medication Sig  . acetaminophen (TYLENOL) 500 MG tablet Take 500 mg by mouth every 6 (six) hours as needed for mild pain or moderate pain.  Marland Kitchen aspirin 325 MG EC tablet Take 325 mg by mouth daily.    . furosemide (LASIX) 40 MG tablet Take 40 mg by mouth daily.    . Ginger, Zingiber officinalis, (GINGER ROOT) 500 MG CAPS Take 2 capsules by mouth daily.    Marland Kitchen losartan (COZAAR) 50 MG tablet Take 50 mg by mouth daily.   . Multiple Vitamin (MULTIVITAMIN WITH MINERALS) TABS tablet Take 1 tablet by mouth daily.  . Omega-3 Fatty Acids (FISH OIL) 1000 MG CAPS Take 1 capsule by mouth daily.  . potassium chloride (K-DUR,KLOR-CON) 10 MEQ tablet Take 10 mEq by mouth daily.  Marland Kitchen PROAIR HFA 108 (90 BASE) MCG/ACT inhaler INHALE 2 PUFFS INTO THE LUNGS EVERY 4 (FOUR) HOURS AS NEEDED FOR WHEEZING OR SHORTNESS OF BREATH.  . Tiotropium Bromide Monohydrate (SPIRIVA RESPIMAT) 2.5 MCG/ACT AERS Inhale 2 puffs into the lungs daily.    FAMILY HISTORY:  Her has no family status information on file.  SOCIAL HISTORY: She  reports that she quit smoking about 25 years ago. She does not have any smokeless tobacco history on file. She reports that she does not drink alcohol or use illicit drugs.  REVIEW OF SYSTEMS:   Gen: Denies fever, chills, weight change, + fatigue, night sweats HEENT: Denies blurred vision, double vision, hearing loss, tinnitus, sinus  congestion, rhinorrhea, sore throat, neck stiffness, dysphagia PULM: per HPI CV: Denies chest pain, edema, orthopnea, paroxysmal nocturnal dyspnea, palpitations GI: Denies abdominal pain, nausea, vomiting, diarrhea, hematochezia, melena, constipation, change in bowel habits GU: Denies dysuria, hematuria, polyuria, oliguria, urethral discharge Endocrine: Denies hot or cold intolerance, polyuria, polyphagia or appetite change Derm: Denies rash, dry skin, scaling or peeling skin change Heme: Denies easy bruising, bleeding, bleeding gums Neuro: Denies headache, numbness, weakness, slurred speech, loss of memory or consciousness   SUBJECTIVE:  As above  VITAL SIGNS: BP 126/76 mmHg  Pulse 91  Temp(Src) 98.2 F (36.8 C) (Oral)  Resp 18  Ht '5\' 2"'$  (1.575 m)  Wt 74 kg (163 lb 2.3 oz)  BMI 29.83 kg/m2  SpO2 98%  HEMODYNAMICS:    VENTILATOR SETTINGS:    INTAKE / OUTPUT: I/O last 3 completed shifts: In: 3140 [P.O.:240; I.V.:2600; IV Piggyback:300] Out: -   PHYSICAL EXAMINATION: General:  Chronically ill appearing, no distress Neuro: Awake, alert, oriented x4, maew HEENT:   NCAT, OP clear Cardiovascular:  RRR, no mr Lungs:  Crackles bases, good air movement, no wheezing Abdomen:  BS+, soft, nontender Musculoskeletal:  Diminished bulk and tone in keeping with age Skin:  No rash or skin breakdown  LABS:  BMET  Recent Labs Lab 07/20/15 1435 07/21/15 0530 07/22/15 0447  NA 140 140 138  K 4.7 5.0 4.8  CL 107 110 111  CO2 22 20* 19*  BUN 21* 19 23*  CREATININE 1.09* 0.85 0.81  GLUCOSE 105* 146* 128*    Electrolytes  Recent Labs Lab 07/20/15 1435 07/21/15 0530 07/22/15 0447  CALCIUM 9.7 9.6 9.3    CBC  Recent Labs Lab 07/20/15 1435 07/21/15 0530  WBC 8.8 3.5*  HGB 15.1* 13.4  HCT 46.0 41.0  PLT 239 175    Coag's No results for input(s): APTT, INR in the last 168 hours.  Sepsis Markers  Recent Labs Lab 07/20/15 1553 07/20/15 1836  LATICACIDVEN  1.66 1.62    ABG No results for input(s): PHART, PCO2ART, PO2ART in the last 168 hours.  Liver Enzymes No results for input(s): AST, ALT, ALKPHOS, BILITOT, ALBUMIN in the last 168 hours.  Cardiac Enzymes No results for input(s): TROPONINI, PROBNP in the last 168 hours.  Glucose No results for input(s): GLUCAP in the last 168 hours.  Imaging No results found.   STUDIES:  3/4 CT chest > multiple pulmonary nodules, small R effusion, mediastinal lymphadenopathy noted; ? ILD which may be NSIP 2011 PFT> Ratio 74%, FEV 1.40 (71%), TLC 5.14 L (68% pred), DLCO 6.9 (34% pred)  CULTURES: 3/4 blood >   ANTIBIOTICS: 3/4 Ceftriaxone >  3/4 azithro >   SIGNIFICANT EVENTS: none  LINES/TUBES: none  DISCUSSION: 82 y/o female with COPD followed by Dr. Annamaria Boots was admitted on 3/4 with dyspnea in the setting of multiple pulmonary nodules.  She is an ex-smoker.  Picture worrisome for metastatic lung cancer.  My concern is that this is small cell given rapid growth recently and ?response to solumedrol.  ASSESSMENT / PLAN:  PULMONARY A: Multiple pulmonary nodules with mediastinal lymphadenopathy ?COPD exacerbation I don't see evidence  of pneumonia ILD? P:   Continue solumedrol and bronchodilators as you are doing Plan for bronchoscopy with EBUS on Wednesday 3/8 at 1:30 PM NPO after midnight on 3/7 PM Will need outpatient PET/CT  MSK: A: Lower back pain, now resolved P: If recurs consider MRI   Son and sister and patient updated bedside on 3/6   Roselie Awkward, MD [facility] Pager: [phone] Cell: [phone] After 3pm or if no response, call [phone]   07/22/2015, 8:56 AM

## 2015-07-22 NOTE — Telephone Encounter (Signed)
Spoke with Stacy at [facility], states pt's son cancelled the ct chest appt because pt had one on 3/4 while admitted at [facility].   Forwarding to CY to make aware.

## 2015-07-22 NOTE — Telephone Encounter (Signed)
Noted- patient is in hospital

## 2015-07-22 NOTE — Progress Notes (Signed)
Initial Nutrition Assessment  DOCUMENTATION CODES:   Not applicable  INTERVENTION:  - Continue Ensure Enlive TID, each supplement provides 350 kcal and 20 grams of protein - Encourage PO intakes of meals and supplements - RD will continue to monitor for needs  NUTRITION DIAGNOSIS:   Inadequate oral intake related to poor appetite as evidenced by per patient/family report.  GOAL:   Patient will meet greater than or equal to 90% of their needs  MONITOR:   PO intake, Weight trends, Labs, I & O's  REASON FOR ASSESSMENT:   Consult  (Weight loss)  ASSESSMENT:   82 y.o. female, with past medical history of COPD, hypertension, large hiatal hernia, macular degeneration, and renal insufficiency, patient presents to ED with complaints of worsening dyspnea over the last few days, complains of cough, with a productive thick white sputum, patient denies any sick contacts, fever or chills, in ED was hypoxic 88% on room air, a shunt was supposed to have high resolution CT chest by her primary pulmonary next week, she had it done in ED, CT chest came back significant with multiple lung masses history of stage IV pulmonary lung cancer, given patient's hypoxia, and significant dyspnea she will be admitted for further workup.  Pt seen for consult. BMI indicates overweight/borderline obesity. Per chart review, pt ate 25% of breakfast and lunch yesterday (3/5). She states that for breakfast this AM she ate 2 pieces of [place] toast and 1/3 of scrambled egg whites. Shortly after she drank 50% of Ensure and for lunch she felt full and was only able to take a few bites each of coleslaw, cottage cheese, and pot roast. Pt denies abdominal discomfort or nausea with intakes but states she feels very full very quickly since gastroenteritis (viral) infection in January 2017. She states that since that time her appetite has been decreased and some days all she is able to eat is a cup of yogurt. Pt states that when  she was unable to eat meals PTA she would drink a [employer] brand meal-replacement shake. Pt states that she has difficulty swallowing bread products as they feel stuck in her throat; she denies swallowing issues with any other items.   She states that eating and drinking does exacerbate breathing and make her increasingly SOB. Pt was becoming increasingly SOB during discussion. No muscle or fat wasting noted during physical assessment. Pt reports that since November 2016 she has lost 10-12 lbs. This would indicate 6-7% wt loss in 4 month time frame which is not significant. Chart review indicates that pt has lost 7 lbs (4% body weight) since November 2016; also not significant.   Pt not meeting needs at this time but does not meet criteria for malnutrition based on nutrition-based parameters. Medications reviewed. Labs reviewed; BUN: 23 mg/dL.   Diet Order:  Diet Heart Room service appropriate?: Yes; Fluid consistency:: Thin  Skin:  Reviewed, no issues  Last BM:  3/6  Height:   Ht Readings from Last 1 Encounters:  07/20/15 '5\' 2"'$  (1.575 m)    Weight:   Wt Readings from Last 1 Encounters:  07/21/15 163 lb 2.3 oz (74 kg)    Ideal Body Weight:  50 kg (kg)  BMI:  Body mass index is 29.83 kg/(m^2).  Estimated Nutritional Needs:   Kcal:  1800-2000  Protein:  90-100 grams  Fluid:  1.8 L/day  EDUCATION NEEDS:   No education needs identified at this time     Jarome Matin, RD, LDN Inpatient Clinical Dietitian  Pager # [phone] After hours/weekend pager # [phone]  

## 2015-07-23 ENCOUNTER — Inpatient Hospital Stay: Admission: RE | Admit: 2015-07-23 | Payer: Medicare Other | Source: Ambulatory Visit

## 2015-07-23 LAB — BASIC METABOLIC PANEL
Anion gap: 11 (ref 5–15)
BUN: 32 mg/dL — AB (ref 6–20)
CALCIUM: 9.5 mg/dL (ref 8.9–10.3)
CHLORIDE: 113 mmol/L — AB (ref 101–111)
CO2: 18 mmol/L — AB (ref 22–32)
CREATININE: 0.81 mg/dL (ref 0.44–1.00)
GFR calc non Af Amer: >60 mL/min (ref >60)
GLUCOSE: 110 mg/dL — AB (ref 65–99)
Potassium: 5.1 mmol/L (ref 3.5–5.1)
Sodium: 142 mmol/L (ref 135–145)

## 2015-07-23 LAB — CBC
HEMATOCRIT: 40.1 % (ref 36.0–46.0)
Hemoglobin: 12.6 g/dL (ref 12.0–15.0)
MCH: 29.1 pg (ref 26.0–34.0)
MCHC: 31.4 g/dL (ref 30.0–36.0)
MCV: 92.6 fL (ref 78.0–100.0)
Platelets: 214 10*3/uL (ref 150–400)
RBC: 4.33 MIL/uL (ref 3.87–5.11)
RDW: 15.7 % — AB (ref 11.5–15.5)
WBC: 8.4 10*3/uL (ref 4.0–10.5)

## 2015-07-23 NOTE — Progress Notes (Signed)
Patient planned for EBUS in am 3/8 at 1:30PM.  Pre-Bronch orders in place.  Patient informed of NPO after 0200 on 3/8.  Patients questions answered regarding duration of case, sedation etc.  Family at bedside, updated as well.     Noe Gens, NP-C [facility] Pgr: [phone] or if no answer [phone] 07/23/2015, 8:24 AM

## 2015-07-23 NOTE — Care Management Important Message (Signed)
Important Message  Patient Details  Name: PRIYA MATSEN MRN: [id number] Date of Birth: 09/19/1928   Medicare Important Message Given:  Yes    [name] 07/23/2015, 2:10 [place] Message  Patient Details  Name: PRISCILLIA FOUCH MRN: [id number] Date of Birth: [birth year]   Medicare Important Message Given:  Yes    [name] 07/23/2015, 2:10 PM

## 2015-07-23 NOTE — Progress Notes (Signed)
Patient Demographics  Kaiyana Bedore, is a 82 y.o. female, DOB - 1928-05-24, MVH:[id number]  Admit date - 07/20/2015   Admitting Physician Albertine Patricia, MD  Outpatient Primary MD for the patient is [name], MD  LOS - 3   Chief Complaint  Patient presents with  . COPD       Admission HPI/Brief narrative: 82 y.o. female, with past medical history of COPD, hypertension, large hiatal hernia, macular degeneration, and renal insufficiency, patient presents to ED with complaints of worsening dyspnea over the last few days, workup significant for COPD, as well as CT chest significant for stage IV pulmonary lung cancer admitted for further workup, COPD improving on IV Solu-Medrol, frequent discontinued as no evidence of pneumonia, and by pulmonary with plan for bronchoscopy with EBUS on Wednesday.  Subjective:   Carl Brazill today has, No headache, No chest pain, No abdominal pain -proven appetite, dyspnea is improving, cough improving . Assessment & Plan    Active Problems:   Hyperlipemia   Essential hypertension   Hypoxia   COPD exacerbation (HCC)   Lung mass  COPD exacerbation  -Patient  with known history of COPD, presents with worsening dyspnea, wheezing significantly improved, continue with IV steroids, continue with nebulizer treatment . -Cough is currently nonproductive,stopped  IV Rocephin and azithromycin 3/6.  CT chest findings suggestive of stage IV lung cancer -Pulmonary consult appreciated, plan is for bronchoscopy with EBUS Tomorrow.  Hypertension -Continue with home medication, w holding losartan today given elevated potassium at 5 on admission.  Hyperlipidemia -Continue with fish oil  Code Status: Full  Family Communication:   Sister  at bedside  Disposition Plan: Home once stable   Procedures  None   Consults   Pulmonary    Medications  Scheduled  Meds: . acidophilus  2 capsule Oral Daily  . feeding supplement (ENSURE ENLIVE)  237 mL Oral TID BM  . guaiFENesin  600 mg Oral BID  . heparin  5,000 Units Subcutaneous 3 times per day  . ipratropium-albuterol  3 mL Nebulization TID  . methylPREDNISolone (SOLU-MEDROL) injection  40 mg Intravenous Q12H  . multivitamin with minerals  1 tablet Oral Daily  . omega-3 acid ethyl esters  1 g Oral Daily  . pantoprazole  40 mg Oral Daily  . tiotropium  1 capsule Inhalation Daily   Continuous Infusions:   PRN Meds:.acetaminophen, albuterol, HYDROcodone-acetaminophen, ondansetron **OR** ondansetron (ZOFRAN) IV  DVT Prophylaxis  Heparin  Lab Results  Component Value Date   PLT 214 07/23/2015    Antibiotics    Anti-infectives    Start     Dose/Rate Route Frequency Ordered Stop   07/21/15 2000  azithromycin (ZITHROMAX) 500 mg in dextrose 5 % 250 mL IVPB  Status:  Discontinued     500 mg 250 mL/hr over 60 Minutes Intravenous Every 24 hours 07/20/15 1946 07/22/15 0908   07/21/15 1800  cefTRIAXone (ROCEPHIN) 1 g in dextrose 5 % 50 mL IVPB  Status:  Discontinued     1 g 100 mL/hr over 30 Minutes Intravenous Every 24 hours 07/20/15 1946 07/22/15 0908   07/20/15 2000  azithromycin (ZITHROMAX) 500 mg in dextrose 5 % 250 mL IVPB     500 mg 250 mL/hr over 60  Minutes Intravenous  Once 07/20/15 1901 07/20/15 2117   07/20/15 1545  cefTRIAXone (ROCEPHIN) 1 g in dextrose 5 % 50 mL IVPB     1 g 100 mL/hr over 30 Minutes Intravenous  Once 07/20/15 1543 07/20/15 1850   07/20/15 1545  azithromycin (ZITHROMAX) 500 mg in dextrose 5 % 250 mL IVPB  Status:  Discontinued     500 mg 250 mL/hr over 60 Minutes Intravenous  Once 07/20/15 1543 07/20/15 1901          Objective:   Filed Vitals:   07/22/15 1931 07/22/15 2157 07/23/15 0617 07/23/15 1104  BP:  147/68 130/52   Pulse:  94 68   Temp:  98.4 F (36.9 C) 97.9 F (36.6 C)   TempSrc:  Oral Oral   Resp:  24 24   Height:      Weight:      SpO2:  93% 94% 92% 94%    Wt Readings from Last 3 Encounters:  07/21/15 74 kg (163 lb 2.3 oz)  07/16/15 74.027 kg (163 lb 3.2 oz)  06/14/15 75.07 kg (165 lb 8 oz)     Intake/Output Summary (Last 24 hours) at 07/23/15 1506 Last data filed at 07/23/15 0900  Gross per 24 hour  Intake   2040 ml  Output    750 ml  Net   1290 ml     Physical Exam  Awake Alert, Oriented X 3, No new F.N deficits, Normal affect Russell.AT,PERRAL Supple Neck,No JVD, Symmetrical Chest wall movement, Good air movement bilaterally,  RRR,No Gallops,Rubs or new Murmurs, No Parasternal Heave +ve B.Sounds, Abd Soft, No tenderness, No organomegaly appriciated, No rebound - guarding or rigidity. No Cyanosis, Clubbing or edema, No new Rash or bruise     Data Review   Micro Results Recent Results (from the past 240 hour(s))  Blood culture (routine x 2)     Status: None (Preliminary result)   Collection Time: 07/20/15  3:45 PM  Result Value Ref Range Status   Specimen Description BLOOD BLOOD LEFT ARM  Final   Special Requests BOTTLES DRAWN AEROBIC AND ANAEROBIC 5CC  Final   Culture   Final    NO GROWTH 3 DAYS Performed at [facility]    Report Status PENDING  Incomplete  Blood culture (routine x 2)     Status: None (Preliminary result)   Collection Time: 07/20/15  5:11 PM  Result Value Ref Range Status   Specimen Description BLOOD BLOOD RIGHT ARM  Final   Special Requests IN PEDIATRIC BOTTLE 1.5CC  Final   Culture   Final    NO GROWTH 3 DAYS Performed at [facility]    Report Status PENDING  Incomplete    Radiology Reports Dg Chest 2 View  07/20/2015  CLINICAL DATA:  Shortness of breath. EXAM: CHEST  2 VIEW COMPARISON:  March 21, 2015 FINDINGS: No pneumothorax. A large hiatal hernia is again identified. The heart, hila, and mediastinum are unchanged. There are new opacities in the lungs which have increased in the interval. The opacities are more focal in the right perihilar region and  rounded/somewhat masslike in the right infrahilar region. These findings are new in the 4 month interval. The opacity is also mildly more prominent in the lateral left lung. The previous study suggested an underlying interstitial process such as fibrosis which persists. No other interval changes or acute abnormalities. IMPRESSION: 1. New bilateral focal pulmonary opacities. The most prominent opacity in the right infrahilar region is  rounded and somewhat masslike. Given that these findings are new in the 4 month interval, they may represent an infectious or inflammatory process. A CT scan may better evaluate. At the least, recommend treatment with a short-term follow-up to ensure resolution. Electronically Signed   By: Dorise Bullion III M.D   On: 07/20/2015 14:34   Ct Chest High Resolution  07/20/2015  CLINICAL DATA:  Shortness of breath. Mediastinal adenopathy. COPD. Ex-smoker. EXAM: CT CHEST WITHOUT CONTRAST TECHNIQUE: Multidetector CT imaging of the chest was performed following the standard protocol without intravenous contrast. High resolution imaging of the lungs, as well as inspiratory and expiratory imaging, was performed. COMPARISON:  Multiple chest radiographs including back to 07/28/2010. No prior CT. FINDINGS: Mediastinum/Lymph Nodes: Tortuous descending thoracic aorta. Normal heart size, without pericardial effusion. Right paratracheal adenopathy at 3.1 x 3.2 cm in image 21/series 3. Subcarinal node measures 2.7 cm on image 27/series 3. Prevascular adenopathy including at 1.3 cm. Complex hiatal hernia, containing the majority of the stomach. No complicating obstruction. Lungs/Pleura: Small right-sided pleural effusion. Right upper lobe pleural-based pulmonary nodule which measures 1.7 x 1.4 cm on image 20/series 10. Minimal motion degradation inferiorly. Right minor fissure thickening/ nodularity at 6 mm on image 25/series 10. Superior segment right lower lobe pulmonary nodule which measures 7 mm on  image 22/series 10. Central right middle lobe lung mass with extension into the right upper lobe. This measures 4.7 by 4.6 cm on image 32/series 10 and coronal image 42. Mild left-sided pulmonary nodularity, including on the order of 2 mm in the left lower lobe on image 20/ series 10. High-resolution images demonstrate subpleural reticulation without basilar predominance. No honeycombing. No ground-glass opacity or micro nodularity. Expiratory images demonstrate no significant air trapping. Upper abdomen: Heterogeneous density throughout the liver, most consistent with extensive metastatic disease. Example vague medial segment left liver lobe 4.0 x 3.9 cm lesion on image 50/series 3. Normal imaged portions of the spleen, adrenal glands, and pancreas. Musculoskeletal: Osteopenia. Possible heterogeneous density throughout the sternal manubrium on sagittal image 50. IMPRESSION: 1. Findings most consistent with stage IV primary bronchogenic carcinoma. Multiple lung nodules with a dominant right middle lobe lung mass. Thoracic adenopathy with innumerable liver lesions. 2. Small right pleural effusion. 3. Osteopenia. Heterogeneous density in the sternal manubrium could relate to heterogeneous osteopenia or infiltrative osseous metastasis. 4. Interstitial lung disease which may represent nonspecific interstitial pneumonitis. 5. Complex large hiatal hernia, without complicating obstruction. These results will be called to the ordering clinician or representative by the Radiologist Assistant, and communication documented in the PACS or zVision Dashboard. Electronically Signed   By: Abigail Miyamoto M.D.   On: 07/20/2015 17:08     CBC  Recent Labs Lab 07/20/15 1435 07/21/15 0530 07/23/15 0425  WBC 8.8 3.5* 8.4  HGB 15.1* 13.4 12.6  HCT 46.0 41.0 40.1  PLT 239 175 214  MCV 90.7 89.7 92.6  MCH 29.8 29.3 29.1  MCHC 32.8 32.7 31.4  RDW 14.9 14.9 15.7*    Chemistries   Recent Labs Lab 07/20/15 1435  07/21/15 0530 07/22/15 0447 07/23/15 0425  NA 140 140 138 142  K 4.7 5.0 4.8 5.1  CL 107 110 111 113*  CO2 22 20* 19* 18*  GLUCOSE 105* 146* 128* 110*  BUN 21* 19 23* 32*  CREATININE 1.09* 0.85 0.81 0.81  CALCIUM 9.7 9.6 9.3 9.5   ------------------------------------------------------------------------------------------------------------------ estimated creatinine clearance is 48.7 mL/min (by C-G formula based on Cr of 0.81). ------------------------------------------------------------------------------------------------------------------ No results for input(s):  HGBA1C in the last 72 hours. ------------------------------------------------------------------------------------------------------------------ No results for input(s): CHOL, HDL, LDLCALC, TRIG, CHOLHDL, LDLDIRECT in the last 72 hours. ------------------------------------------------------------------------------------------------------------------ No results for input(s): TSH, T4TOTAL, T3FREE, THYROIDAB in the last 72 hours.  Invalid input(s): FREET3 ------------------------------------------------------------------------------------------------------------------ No results for input(s): VITAMINB12, FOLATE, FERRITIN, TIBC, IRON, RETICCTPCT in the last 72 hours.  Coagulation profile No results for input(s): INR, PROTIME in the last 168 hours.  No results for input(s): DDIMER in the last 72 hours.  Cardiac Enzymes No results for input(s): CKMB, TROPONINI, MYOGLOBIN in the last 168 hours.  Invalid input(s): CK ------------------------------------------------------------------------------------------------------------------ Invalid input(s): POCBNP     Time Spent in minutes   20 minutes   ELGERGAWY, DAWOOD M.D on 07/23/2015 at 3:06 PM  Between 7am to 7pm - Pager - [phone]  After 7pm go to [email or web address] - password [facility]  [facility]   Office  [phone]

## 2015-07-24 ENCOUNTER — Encounter (HOSPITAL_COMMUNITY): Payer: Self-pay

## 2015-07-24 ENCOUNTER — Inpatient Hospital Stay (HOSPITAL_COMMUNITY): Payer: Medicare Other | Admitting: Certified Registered Nurse Anesthetist

## 2015-07-24 ENCOUNTER — Encounter (HOSPITAL_COMMUNITY)
Admission: EM | Disposition: A | Discharge status: Home Health | Payer: Self-pay | Source: Home / Self Care | Attending: Internal Medicine

## 2015-07-24 ENCOUNTER — Inpatient Hospital Stay (HOSPITAL_COMMUNITY): Payer: Medicare Other

## 2015-07-24 DIAGNOSIS — I10 Essential (primary) hypertension: Secondary | ICD-10-CM

## 2015-07-24 DIAGNOSIS — R590 Localized enlarged lymph nodes: Secondary | ICD-10-CM | POA: Insufficient documentation

## 2015-07-24 DIAGNOSIS — R599 Enlarged lymph nodes, unspecified: Secondary | ICD-10-CM

## 2015-07-24 HISTORY — PX: ENDOBRONCHIAL ULTRASOUND: SHX5096

## 2015-07-24 LAB — BASIC METABOLIC PANEL
Anion gap: 10 (ref 5–15)
BUN: 30 mg/dL — ABNORMAL HIGH (ref 6–20)
CALCIUM: 9.1 mg/dL (ref 8.9–10.3)
CHLORIDE: 109 mmol/L (ref 101–111)
CO2: 21 mmol/L — ABNORMAL LOW (ref 22–32)
CREATININE: 0.73 mg/dL (ref 0.44–1.00)
Glucose, Bld: 110 mg/dL — ABNORMAL HIGH (ref 65–99)
Potassium: 5 mmol/L (ref 3.5–5.1)
SODIUM: 140 mmol/L (ref 135–145)

## 2015-07-24 SURGERY — ENDOBRONCHIAL ULTRASOUND (EBUS)
Anesthesia: General | Laterality: Bilateral

## 2015-07-24 MED ORDER — PROPOFOL 10 MG/ML IV BOLUS
INTRAVENOUS | Status: AC
  Filled 2015-07-24: qty 20

## 2015-07-24 MED ORDER — PROPOFOL 10 MG/ML IV BOLUS
INTRAVENOUS | Status: DC | PRN
  Administered 2015-07-24: 140 mg via INTRAVENOUS

## 2015-07-24 MED ORDER — PREDNISONE 20 MG PO TABS
40.0000 mg | ORAL_TABLET | Freq: Every day | ORAL | Status: DC
  Administered 2015-07-24 – 2015-07-25 (×2): 40 mg via ORAL
  Filled 2015-07-24 (×2): qty 2

## 2015-07-24 MED ORDER — LIDOCAINE HCL (CARDIAC) 20 MG/ML IV SOLN
INTRAVENOUS | Status: DC | PRN
  Administered 2015-07-24: 50 mg via INTRAVENOUS

## 2015-07-24 MED ORDER — PHENYLEPHRINE HCL 10 MG/ML IJ SOLN
INTRAMUSCULAR | Status: DC | PRN
  Administered 2015-07-24: 100 ug via INTRAVENOUS

## 2015-07-24 MED ORDER — SUCCINYLCHOLINE CHLORIDE 20 MG/ML IJ SOLN
INTRAMUSCULAR | Status: DC | PRN
  Administered 2015-07-24: 80 mg via INTRAVENOUS

## 2015-07-24 MED ORDER — ONDANSETRON HCL 4 MG/2ML IJ SOLN
INTRAMUSCULAR | Status: DC | PRN
  Administered 2015-07-24: 4 mg via INTRAVENOUS

## 2015-07-24 MED ORDER — BOOST PLUS PO LIQD
237.0000 mL | Freq: Three times a day (TID) | ORAL | Status: DC
  Administered 2015-07-24 – 2015-07-25 (×2): 237 mL via ORAL
  Filled 2015-07-24 (×4): qty 237

## 2015-07-24 MED ORDER — SODIUM CHLORIDE 0.9 % IJ SOLN
PREFILLED_SYRINGE | INTRAMUSCULAR | Status: DC | PRN
  Administered 2015-07-24: 3 mL

## 2015-07-24 MED ORDER — SODIUM CHLORIDE 0.9 % IV SOLN
INTRAVENOUS | Status: DC
  Administered 2015-07-24: 1000 mL via INTRAVENOUS

## 2015-07-24 NOTE — Anesthesia Procedure Notes (Signed)
Procedure Name: Intubation Performed by: Gean Maidens Pre-anesthesia Checklist: Patient identified, Emergency Drugs available, Suction available, Patient being monitored and Timeout performed Patient Re-evaluated:Patient Re-evaluated prior to inductionOxygen Delivery Method: Circle system utilized Preoxygenation: Pre-oxygenation with 100% oxygen Intubation Type: IV induction Ventilation: Mask ventilation without difficulty Laryngoscope Size: Mac and 3 Grade View: Grade I Tube type: Oral Tube size: 8.0 mm Number of attempts: 1 Airway Equipment and Method: Stylet Placement Confirmation: ETT inserted through vocal cords under direct vision,  positive ETCO2,  CO2 detector and breath sounds checked- equal and bilateral Secured at: 21 cm Tube secured with: Tape Dental Injury: Teeth and Oropharynx as per pre-operative assessment

## 2015-07-24 NOTE — Progress Notes (Signed)
PT Cancellation Note  Patient Details Name: Meghan Castro MRN: [id number] DOB: 05-21-1928   Cancelled Treatment:    Reason Eval/Treat Not Completed: Medical issues which prohibited therapy (noted scheduled for bronchoscopy today. will check back 3/9.)   [name] 07/24/2015, 7:46 AM [name] PT [phone]

## 2015-07-24 NOTE — Op Note (Signed)
[facility] Cardiopulmonary Patient Name: Meghan Castro Procedure Date: 07/24/2015 MRN: [id number] Attending MD: [name] , MD Date of Birth: 1928/06/08 CSN: [id number] Age: 82 Admit Type: Inpatient Ethnicity:  Procedure:            Bronchoscopy Indications:          Right middle lobe mass, Mediastinal adenopathy Providers:            Nathaneil Canary B. Lake Bells, MD, Carmie End, RN, Alfonso Patten, Technician, Herbie Drape, CRNA Referring MD:          Medicines:            General Anesthesia, Epinephrine 1 mg/10 mL topical 2 mL Complications:        No immediate complications Estimated Blood Loss: Estimated blood loss was minimal from RML biopsy,                        controlled with one aliquot of epinephrine. Procedure:      Pre-Anesthesia Assessment:      - Prior to the procedure, a History and Physical was performed, and       patient medications and allergies were reviewed. The patient's tolerance       of previous anesthesia was also reviewed. The risks and benefits of the       procedure and the sedation options and risks were discussed with the       patient. All questions were answered, and informed consent was obtained.       Prior Anticoagulants: The patient has taken no previous anticoagulant or       antiplatelet agents. ASA Grade Assessment: III - A patient with severe       systemic disease. After reviewing the risks and benefits, the patient       was deemed in satisfactory condition to undergo the procedure.      After obtaining informed consent, the bronchoscope was passed under       direct vision. Throughout the procedure, the patient's blood pressure,       pulse, and oxygen saturations were monitored continuously. the [id number]       [id number] scope was introduced through the mouth, via the endotracheal       tube and advanced to the tracheobronchial tree. the [id number] ([id number])       scope was introduced through the mouth,  via the endotracheal tube (the       patient was intubated for the procedure) and advanced to the       tracheobronchial tree of both lungs. The procedure was accomplished       without difficulty. The patient tolerated the procedure well. Findings:      The nasopharynx/oropharynx appears normal. The larynx appears normal.       The vocal cords appear normal. The subglottic space is normal. The       trachea is of normal caliber. The carina is sharp. The tracheobronchial       tree of the left lung was examined to at least the first subsegmental       level. Bronchial mucosa and anatomy in the left lung are normal; there       are no endobronchial lesions, and no secretions.      Right Lung Abnormalities: A completely obstructing mass was found  proximally, at the orifice in the medial segment of the right middle       lobe (B5). The mass was medium-sized and endobronchial and infiltrative.       The lesion was not traversed. Endobronchial biopsies were performed in       the medial segment of the right middle lobe using forceps and sent for       histopathology examination. Two samples were obtained. Washings were       obtained in the medial segment of the right middle lobe and sent for       routine cytology. The return was bloody.      Lymph Nodes: Lymph node sizing was performed via endobronchial       ultrasound for suspected lung cancer. Sampling by transbronchial needle       aspiration was also performed using a fine (21 gauge) needle in the       subcarinal mediastinum (level 7) and sent for routine cytology.      - The 7 (subcarinal) node was 3 mm by EBUS. Four samples with the needle       were obtained. Impression:      - Right middle lobe mass      - Mediastinal adenopathy      - The left lung was normal.      - An endobronchial and infiltrative mass was found in the medial segment       of the right middle lobe (B5). This lesion is malignant.      - An endobronchial  biopsy was performed.      - Washings were obtained.      - Lymph node sizing and sampling was performed. The diagnosis is       malignancy. Moderate Sedation:      General anesthesia used for this procedure by Anesthesia service. Recommendation:      - Await biopsy and cytology results. Procedure Code(s):      --- Professional ---      [phone], Bronchoscopy, rigid or flexible, including fluoroscopic guidance,       when performed; with endobronchial ultrasound (EBUS) guided       transtracheal and/or transbronchial sampling (eg,       aspiration[s]/biopsy[ies]), one or two mediastinal and/or hilar lymph       node stations or structures      09628, Bronchoscopy, rigid or flexible, including fluoroscopic guidance,       when performed; with bronchial or endobronchial biopsy(s), single or       multiple sites Diagnosis Code(s):      --- Professional ---      R91.8, Other nonspecific abnormal finding of lung field      R59.9, Enlarged lymph nodes, unspecified      C34.2, Malignant neoplasm of middle lobe, bronchus or lung      C96.9, Malignant neoplasm of lymphoid, hematopoietic and related tissue,       unspecified CPT copyright 2016 [employer]. All rights reserved. The codes documented in this report are preliminary and upon coder review may  be revised to meet current compliance requirements. Attending Participation: Norlene Campbell, MD [name], MD 07/24/2015 3:06:24 PM This report has been signed electronically. Number of Addenda: 0 Scope In: Scope Out:

## 2015-07-24 NOTE — Progress Notes (Signed)
Video bronchoscopy performed following EBUS procedure Intervention bronchial washings Intervention bronchial biopsies Pt tolerated well  Kathie Dike  RRT

## 2015-07-24 NOTE — Progress Notes (Signed)
[facility] P[facility]  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

## 2015-07-24 NOTE — Progress Notes (Signed)
Patient Demographics  Meghan Castro, is a 82 y.o. female, DOB - Oct 13, 1928, [id number]  Admit date - 07/20/2015   Admitting Physician Meghan Patricia, MD  Outpatient Primary MD for the patient is Meghan Mustache, MD  LOS - 4   Chief Complaint  Patient presents with  . COPD       Admission HPI/Brief narrative: 82 y.o. female, with past medical history of COPD, hypertension, large hiatal hernia, macular degeneration, and renal insufficiency, patient presents to ED with complaints of worsening dyspnea over the last few days, workup significant for COPD, as well as CT chest significant for stage IV pulmonary lung cancer admitted for further workup, COPD improving on IV Solu-Medrol, frequent discontinued as no evidence of pneumonia, and by pulmonary with plan for bronchoscopy with EBUS on Wednesday.  Subjective:   Meghan Castro has no complaints of cough, chest pain, dyspnea, nausea, vomting, diarrhea or constipation.   Assessment & Plan    Active Problems:   Hyperlipemia   Essential hypertension   Hypoxia   COPD exacerbation (HCC)   Lung mass   Mediastinal lymphadenopathy  COPD exacerbation  -Patient  with known history of COPD presents with worsening dyspnea - wheezing significantly improved  - wean steroids -Cough is nonproductive- stopped  IV Rocephin and azithromycin 3/6 - assess for home O2 needs  CT chest findings of RML mass, multiple surrounding lung nodules and thoracic adenopathy with liver lesions -Pulmonary consult appreciated - bronchoscopy with EBUS today reveals a completely obstructing, infiltrative mass in bronchus to RML- biopsies done  Hypertension -Continue with home medication,- cont holding losartan today given elevated potassium at 5 on admission.  Hyperkalemia - she states she taked K supplements daily and Lasix only PRN for fluid gain- have advised her not to  take the K without the Lasix  Dehydration - daily lasix on hold - as mentioned, she takes it PRN at home- no h/o CHF  Hyperlipidemia -Continue with fish oil  Code Status: Full  Family Communication:   Sister  at bedside  Disposition Plan: Home once stable   Procedures  EBUS   Consults   Pulmonary    Medications  Scheduled Meds: . guaiFENesin  600 mg Oral BID  . heparin  5,000 Units Subcutaneous 3 times per day  . ipratropium-albuterol  3 mL Nebulization TID  . lactose free nutrition  237 mL Oral TID WC  . multivitamin with minerals  1 tablet Oral Daily  . omega-3 acid ethyl esters  1 g Oral Daily  . pantoprazole  40 mg Oral Daily  . predniSONE  40 mg Oral Q breakfast  . tiotropium  1 capsule Inhalation Daily   Continuous Infusions:   PRN Meds:.acetaminophen, albuterol, HYDROcodone-acetaminophen, ondansetron **OR** ondansetron (ZOFRAN) IV  DVT Prophylaxis  Heparin  Lab Results  Component Value Date   PLT 214 07/23/2015    Antibiotics    Anti-infectives    Start     Dose/Rate Route Frequency Ordered Stop   07/21/15 2000  azithromycin (ZITHROMAX) 500 mg in dextrose 5 % 250 mL IVPB  Status:  Discontinued     500 mg 250 mL/hr over 60 Minutes Intravenous Every 24 hours 07/20/15 1946 07/22/15 0908   07/21/15 1800  cefTRIAXone (ROCEPHIN) 1  g in dextrose 5 % 50 mL IVPB  Status:  Discontinued     1 g 100 mL/hr over 30 Minutes Intravenous Every 24 hours 07/20/15 1946 07/22/15 0908   07/20/15 2000  azithromycin (ZITHROMAX) 500 mg in dextrose 5 % 250 mL IVPB     500 mg 250 mL/hr over 60 Minutes Intravenous  Once 07/20/15 1901 07/20/15 2117   07/20/15 1545  cefTRIAXone (ROCEPHIN) 1 g in dextrose 5 % 50 mL IVPB     1 g 100 mL/hr over 30 Minutes Intravenous  Once 07/20/15 1543 07/20/15 1850   07/20/15 1545  azithromycin (ZITHROMAX) 500 mg in dextrose 5 % 250 mL IVPB  Status:  Discontinued     500 mg 250 mL/hr over 60 Minutes Intravenous  Once 07/20/15 1543 07/20/15  1901          Objective:   Filed Vitals:   07/24/15 1520 07/24/15 1530 07/24/15 1540 07/24/15 1614  BP: 130/53 148/55 138/63 135/62  Pulse: 99 101 101 95  Temp:    98.3 F (36.8 C)  TempSrc:    Oral  Resp: 35 24 30   Height:      Weight:      SpO2: 97% 97% 91% 92%    Wt Readings from Last 3 Encounters:  07/21/15 74 kg (163 lb 2.3 oz)  07/16/15 74.027 kg (163 lb 3.2 oz)  06/14/15 75.07 kg (165 lb 8 oz)     Intake/Output Summary (Last 24 hours) at 07/24/15 1649 Last data filed at 07/24/15 1501  Gross per 24 hour  Intake    940 ml  Output    755 ml  Net    185 ml     Physical Exam  Awake Alert, Oriented X 3, No new F.N deficits, Normal affect [place] [employer].AT,PERRAL Supple Neck,No JVD, Symmetrical Chest wall movement, mild crackles in R mid lung field RRR,No Gallops,Rubs or new Murmurs, No Parasternal Heave +ve B.Sounds, Abd Soft, No tenderness, No organomegaly appriciated, No rebound, guarding or rigidity. No Cyanosis, Clubbing or edema, No new Rash or bruise     Data Review   Micro Results Recent Results (from the past 240 hour(s))  Blood culture (routine x 2)     Status: None (Preliminary result)   Collection Time: 07/20/15  3:45 PM  Result Value Ref Range Status   Specimen Description BLOOD BLOOD LEFT ARM  Final   Special Requests BOTTLES DRAWN AEROBIC AND ANAEROBIC 5CC  Final   Culture   Final    NO GROWTH 4 DAYS Performed at [facility]    Report Status PENDING  Incomplete  Blood culture (routine x 2)     Status: None (Preliminary result)   Collection Time: 07/20/15  5:11 PM  Result Value Ref Range Status   Specimen Description BLOOD BLOOD RIGHT ARM  Final   Special Requests IN PEDIATRIC BOTTLE 1.5CC  Final   Culture   Final    NO GROWTH 4 DAYS Performed at [facility]    Report Status PENDING  Incomplete    Radiology Reports Dg Chest 2 View  07/20/2015  CLINICAL DATA:  Shortness of breath. EXAM: CHEST  2 VIEW COMPARISON:  March 21, 2015 FINDINGS: No pneumothorax. A large hiatal hernia is again identified. The heart, hila, and mediastinum are unchanged. There are new opacities in the lungs which have increased in the interval. The opacities are more focal in the right perihilar region and rounded/somewhat masslike in the right infrahilar region. These  findings are new in the 4 month interval. The opacity is also mildly more prominent in the lateral left lung. The previous study suggested an underlying interstitial process such as fibrosis which persists. No other interval changes or acute abnormalities. IMPRESSION: 1. New bilateral focal pulmonary opacities. The most prominent opacity in the right infrahilar region is rounded and somewhat masslike. Given that these findings are new in the 4 month interval, they may represent an infectious or inflammatory process. A CT scan may better evaluate. At the least, recommend treatment with a short-term follow-up to ensure resolution. Electronically Signed   By: Dorise Bullion III M.D   On: 07/20/2015 14:34   Ct Chest High Resolution  07/20/2015  CLINICAL DATA:  Shortness of breath. Mediastinal adenopathy. COPD. Ex-smoker. EXAM: CT CHEST WITHOUT CONTRAST TECHNIQUE: Multidetector CT imaging of the chest was performed following the standard protocol without intravenous contrast. High resolution imaging of the lungs, as well as inspiratory and expiratory imaging, was performed. COMPARISON:  Multiple chest radiographs including back to 07/28/2010. No prior CT. FINDINGS: Mediastinum/Lymph Nodes: Tortuous descending thoracic aorta. Normal heart size, without pericardial effusion. Right paratracheal adenopathy at 3.1 x 3.2 cm in image 21/series 3. Subcarinal node measures 2.7 cm on image 27/series 3. Prevascular adenopathy including at 1.3 cm. Complex hiatal hernia, containing the majority of the stomach. No complicating obstruction. Lungs/Pleura: Small right-sided pleural effusion. Right upper lobe  pleural-based pulmonary nodule which measures 1.7 x 1.4 cm on image 20/series 10. Minimal motion degradation inferiorly. Right minor fissure thickening/ nodularity at 6 mm on image 25/series 10. Superior segment right lower lobe pulmonary nodule which measures 7 mm on image 22/series 10. Central right middle lobe lung mass with extension into the right upper lobe. This measures 4.7 by 4.6 cm on image 32/series 10 and coronal image 42. Mild left-sided pulmonary nodularity, including on the order of 2 mm in the left lower lobe on image 20/ series 10. High-resolution images demonstrate subpleural reticulation without basilar predominance. No honeycombing. No ground-glass opacity or micro nodularity. Expiratory images demonstrate no significant air trapping. Upper abdomen: Heterogeneous density throughout the liver, most consistent with extensive metastatic disease. Example vague medial segment left liver lobe 4.0 x 3.9 cm lesion on image 50/series 3. Normal imaged portions of the spleen, adrenal glands, and pancreas. Musculoskeletal: Osteopenia. Possible heterogeneous density throughout the sternal manubrium on sagittal image 50. IMPRESSION: 1. Findings most consistent with stage IV primary bronchogenic carcinoma. Multiple lung nodules with a dominant right middle lobe lung mass. Thoracic adenopathy with innumerable liver lesions. 2. Small right pleural effusion. 3. Osteopenia. Heterogeneous density in the sternal manubrium could relate to heterogeneous osteopenia or infiltrative osseous metastasis. 4. Interstitial lung disease which may represent nonspecific interstitial pneumonitis. 5. Complex large hiatal hernia, without complicating obstruction. These results will be called to the ordering clinician or representative by the Radiologist Assistant, and communication documented in the PACS or zVision Dashboard. Electronically Signed   By: Abigail Miyamoto M.D.   On: 07/20/2015 17:08     CBC  Recent Labs Lab  07/20/15 1435 07/21/15 0530 07/23/15 0425  WBC 8.8 3.5* 8.4  HGB 15.1* 13.4 12.6  HCT 46.0 41.0 40.1  PLT 239 175 214  MCV 90.7 89.7 92.6  MCH 29.8 29.3 29.1  MCHC 32.8 32.7 31.4  RDW 14.9 14.9 15.7*    Chemistries   Recent Labs Lab 07/20/15 1435 07/21/15 0530 07/22/15 0447 07/23/15 0425 07/24/15 0450  NA 140 140 138 142 140  K 4.7 5.0  4.8 5.1 5.0  CL 107 110 111 113* 109  CO2 22 20* 19* 18* 21*  GLUCOSE 105* 146* 128* 110* 110*  BUN 21* 19 23* 32* 30*  CREATININE 1.09* 0.85 0.81 0.81 0.73  CALCIUM 9.7 9.6 9.3 9.5 9.1   ------------------------------------------------------------------------------------------------------------------ estimated creatinine clearance is 49.3 mL/min (by C-G formula based on Cr of 0.73). ------------------------------------------------------------------------------------------------------------------ No results for input(s): HGBA1C in the last 72 hours. ------------------------------------------------------------------------------------------------------------------ No results for input(s): CHOL, HDL, LDLCALC, TRIG, CHOLHDL, LDLDIRECT in the last 72 hours. ------------------------------------------------------------------------------------------------------------------ No results for input(s): TSH, T4TOTAL, T3FREE, THYROIDAB in the last 72 hours.  Invalid input(s): FREET3 ------------------------------------------------------------------------------------------------------------------ No results for input(s): VITAMINB12, FOLATE, FERRITIN, TIBC, IRON, RETICCTPCT in the last 72 hours.  Coagulation profile No results for input(s): INR, PROTIME in the last 168 hours.  No results for input(s): DDIMER in the last 72 hours.  Cardiac Enzymes No results for input(s): CKMB, TROPONINI, MYOGLOBIN in the last 168 hours.  Invalid input(s):  CK ------------------------------------------------------------------------------------------------------------------ Invalid input(s): POCBNP     Time Spent in minutes   5 minutes   Micca Matura M.D on 07/24/2015 at 4:49 PM  Pager  [email or web address] - password [facility]  [facility]   Office  [phone]

## 2015-07-24 NOTE — H&P (Signed)
[facility] P[facility]  HPI: Ms. Buerkle is my partner's office patient with COPD who was found to have multiple lung masses and mediastinal lymph nodes.  She is here today for an EBUS bronchoscopy.  Past Medical History  Diagnosis Date  . Chronic airway obstruction, not elsewhere classified   . Unspecified sinusitis (chronic)   . Diaphragmatic hernia without mention of obstruction or gangrene   . Macular degeneration (senile) of retina, unspecified   . Other and unspecified hyperlipidemia   . Unspecified essential hypertension   . Unspecified disorder resulting from impaired renal function   . Mediastinal adenopathy      Family History  Problem Relation Age of Onset  . Emphysema Father   . Heart disease Mother   . Lung cancer Father      Social History   Social History  . Marital Status: Widowed    Spouse Name: N/A  . Number of Children: N/A  . Years of Education: N/A   Occupational History  . retired Therapist, nutritional on [employer]  . Smoking status: Former Smoker    Quit date: 11/14/1989  . Smokeless tobacco: Not on file  . Alcohol Use: No  . Drug Use: No  . Sexual Activity: Not on file   Other Topics Concern  . Not on file   Social History Narrative     Allergies  Allergen Reactions  . Other Shortness Of Breath    *Perfumes*  . Penicillins     Has patient had a PCN reaction causing immediate rash, facial/tongue/throat swelling, SOB or lightheadedness with hypotension: No Has patient had a PCN reaction causing severe rash involving mucus membranes or skin necrosis: No Has patient had a PCN reaction that required hospitalization No Has patient had a PCN reaction occurring within the last 10 years: No If all of the above answers are "NO", then may proceed with Cephalosporin use.   . Pravastatin Sodium     REACTION: swelling in tounge and feet turn black  . Quinolones Other (See Comments)    hyper  . Moxifloxacin Anxiety      '@encmedstart'$ @  Filed Vitals:   07/23/15 2013 07/24/15 0653 07/24/15 0831 07/24/15 1319  BP: 139/68 141/55  158/77  Pulse: 97 79  90  Temp: 97.9 F (36.6 C) 98.2 F (36.8 C)  98.3 F (36.8 C)  TempSrc: Oral Oral  Oral  Resp: '20 22  22  '$ Height:      Weight:      SpO2: 94% 94% 92% 90%   Gen: chronically ill appearing, no acute distress HENT: NCAT  PULM: no wheezing, normal respiratory effort CV: RRR, no mgr GI: BS+, soft, nontender  CBC    Component Value Date/Time   WBC 8.4 07/23/2015 0425   RBC 4.33 07/23/2015 0425   HGB 12.6 07/23/2015 0425   HCT 40.1 07/23/2015 0425   PLT 214 07/23/2015 0425   MCV 92.6 07/23/2015 0425   MCH 29.1 07/23/2015 0425   MCHC 31.4 07/23/2015 0425   RDW 15.7* 07/23/2015 0425   LYMPHSABS 1.1 06/14/2015 2205   MONOABS 0.5 06/14/2015 2205   EOSABS 0.0 06/14/2015 2205   BASOSABS 0.0 06/14/2015 2205    CT images personally reviewed: RML mass noted, large subcarinal lymph node  Impression/Plan: Lung mass with mediastinal lymphadenopathy: picture worrisome for stage IV lung cancer.  Needs definitive, tissue diagnosis.  We discussed the risks and benefits of doing a thoracentesis vs an EBUS  today with general anesthesia.  Certainly there is a risk that she would need to be maintained on a vent temporarily after the procedure.  But as she was able to walk the unit on room air today I don't think the risk of anesthesia is excessive enough to forgo today's procedure as this will likely provide the definitive diagnosis. -EBUS bronchoscopy now  Roselie Awkward, MD [place] [facility] Pager: [phone] Cell: [phone] After 3pm or if no response, call [phone]

## 2015-07-24 NOTE — Progress Notes (Signed)
SATURATION QUALIFICATIONS: (This note is used to comply with regulatory documentation for home oxygen)  Patient Saturations on Room Air at Rest =96%  Patient Saturations on Room Air while Ambulating = 89-91%x 220' Patient Saturations on  Liters of oxygen while Ambulating =%NT as sats >89%  Please briefly explain why patient needs home oxygen:no  Needs identified based on today's test.Baxter Gonzalez [place] PT [phone]

## 2015-07-24 NOTE — Anesthesia Preprocedure Evaluation (Addendum)
Anesthesia Evaluation  Patient identified by MRN, date of birth, ID band Patient awake    Reviewed: Allergy & Precautions, H&P , NPO status , Patient's Chart, lab work & pertinent test results  Airway Mallampati: II  TM Distance: >3 FB Neck ROM: full    Dental  (+) Dental Advisory Given, Edentulous Upper, Edentulous Lower   Pulmonary shortness of breath and with exertion, COPD,  COPD inhaler, former smoker,  Lung canceer. Severe COPD   Pulmonary exam normal breath sounds clear to auscultation       Cardiovascular Exercise Tolerance: Good hypertension, Normal cardiovascular exam Rhythm:regular Rate:Normal     Neuro/Psych Macular degeneration negative neurological ROS  negative psych ROS   GI/Hepatic negative GI ROS, Neg liver ROS,   Endo/Other  negative endocrine ROS  Renal/GU negative Renal ROS  negative genitourinary   Musculoskeletal   Abdominal   Peds  Hematology negative hematology ROS (+)   Anesthesia Other Findings   Reproductive/Obstetrics negative OB ROS                            Anesthesia Physical Anesthesia Plan  ASA: IV  Anesthesia Plan: General   Post-op Pain Management:    Induction: Intravenous  Airway Management Planned: Oral ETT  Additional Equipment:   Intra-op Plan:   Post-operative Plan: Extubation in OR  Informed Consent: I have reviewed the patients History and Physical, chart, labs and discussed the procedure including the risks, benefits and alternatives for the proposed anesthesia with the patient or authorized representative who has indicated his/her understanding and acceptance.   Dental Advisory Given  Plan Discussed with: CRNA  Anesthesia Plan Comments:        Anesthesia Quick Evaluation

## 2015-07-24 NOTE — Anesthesia Postprocedure Evaluation (Signed)
Anesthesia Post Note  Patient: Meghan Castro  Procedure(s) Performed: Procedure(s) (LRB): ENDOBRONCHIAL ULTRASOUND (Bilateral)  Patient location during evaluation: Endoscopy Anesthesia Type: General Level of consciousness: awake and alert Pain management: pain level controlled Vital Signs Assessment: post-procedure vital signs reviewed and stable Respiratory status: spontaneous breathing, nonlabored ventilation, respiratory function stable and patient connected to nasal cannula oxygen Cardiovascular status: blood pressure returned to baseline and stable Postop Assessment: no signs of nausea or vomiting Anesthetic complications: no    Last Vitals:  Filed Vitals:   07/24/15 1510 07/24/15 1520  BP: 106/46 130/53  Pulse: 95 99  Temp:    Resp: 33 35    Last Pain:  Filed Vitals:   07/24/15 1521  PainSc: 0-No pain                 Catalina Gravel

## 2015-07-24 NOTE — Transfer of Care (Signed)
Immediate Anesthesia Transfer of Care Note  Patient: Meghan Castro  Procedure(s) Performed: Procedure(s): ENDOBRONCHIAL ULTRASOUND (Bilateral)  Patient Location: PACU  Anesthesia Type:General  Level of Consciousness: awake, alert  and oriented  Airway & Oxygen Therapy: Patient Spontanous Breathing and Patient connected to face mask oxygen  Post-op Assessment: Report given to RN and Post -op Vital signs reviewed and stable  Post vital signs: Reviewed and stable  Last Vitals:  Filed Vitals:   07/24/15 0653 07/24/15 1319  BP: 141/55 158/77  Pulse: 79 90  Temp: 36.8 C 36.8 C  Resp: 22 22    Complications: No apparent anesthesia complications

## 2015-07-24 NOTE — Progress Notes (Signed)
Physical Therapy Treatment Patient Details Name: SERENE KOPF MRN: [id number] DOB: 1928/08/17 Today's Date: 07/24/2015    History of Present Illness Patient presents to the ED with a chief complaint of SOB. She states that she has been having worsening SOB for the past several days. She reports associated productive cough. CT chest came back significant with multiple lung masses history of stage IV pulmonary lung cancer, given patient's hypoxia, and significant dyspnea she will be admitted for further workup.    PT Comments    The patient ambulated 220' with sats  Lowest 89% RA and HR 120. RN aware. Recommend HHPT and aide.  Follow Up Recommendations  Home health PT;Supervision/Assistance - 24 hour [facility] aide)     Equipment Recommendations  None recommended by PT    Recommendations for Other Services       Precautions / Restrictions Precautions Precautions: Fall Precaution Comments: monitor VS,     Mobility  Bed Mobility Overal bed mobility: Modified Independent             General bed mobility comments: extra time  Transfers   Equipment used: 1 person Hoes held assist Transfers: Sit to/from Stand Sit to Stand: Min assist         General transfer comment: steady assist to stand  Ambulation/Gait Ambulation/Gait assistance: Min guard Ambulation Distance (Feet): 225 Feet Assistive device: Rolling walker (2 wheeled) Gait Pattern/deviations: Step-through pattern     General Gait Details: slow and steady, HR max 120, sats low 89% on RA   Stairs            Wheelchair Mobility    Modified Rankin (Stroke Patients Only)       Balance           Standing balance support: No upper extremity supported Standing balance-Leahy Scale: Fair Standing balance comment: manages [employer] Arousal/Alertness: Awake/alert                          Exercises      General Comments        Pertinent Vitals/Pain  Pain Assessment: No/denies pain    Home Living                      Prior Function            PT Goals (current goals can now be found in the care plan section) Progress towards PT goals: Progressing toward goals    Frequency  Min 3X/week    PT Plan Current plan remains appropriate    Co-evaluation             End of Session   Activity Tolerance: Patient tolerated treatment well Patient left: in chair;with call bell/phone within reach (family requested no alarm as they are w/th patient.)     Time: 1030-1056 PT Time Calculation (min) (ACUTE ONLY): 26 min  Charges:  $Gait Training: 23-37 mins                    G Codes:      [name] 07/24/2015, 11:14 AM [name] PT [phone]

## 2015-07-25 ENCOUNTER — Inpatient Hospital Stay (HOSPITAL_COMMUNITY): Payer: Medicare Other

## 2015-07-25 DIAGNOSIS — C342 Malignant neoplasm of middle lobe, bronchus or lung: Principal | ICD-10-CM

## 2015-07-25 DIAGNOSIS — C787 Secondary malignant neoplasm of liver and intrahepatic bile duct: Secondary | ICD-10-CM

## 2015-07-25 DIAGNOSIS — C801 Malignant (primary) neoplasm, unspecified: Secondary | ICD-10-CM

## 2015-07-25 DIAGNOSIS — R197 Diarrhea, unspecified: Secondary | ICD-10-CM | POA: Insufficient documentation

## 2015-07-25 DIAGNOSIS — R0902 Hypoxemia: Secondary | ICD-10-CM

## 2015-07-25 DIAGNOSIS — C3491 Malignant neoplasm of unspecified part of right bronchus or lung: Secondary | ICD-10-CM

## 2015-07-25 LAB — CULTURE, BLOOD (ROUTINE X 2)
CULTURE: NO GROWTH
CULTURE: NO GROWTH

## 2015-07-25 MED ORDER — DIPHENOXYLATE-ATROPINE 2.5-0.025 MG PO TABS: 2.0000 | ORAL_TABLET | Freq: Four times a day (QID) | ORAL | Status: DC | PRN

## 2015-07-25 MED ORDER — PREDNISONE 20 MG PO TABS
20.0000 mg | ORAL_TABLET | Freq: Every day | ORAL | Status: DC
  Administered 2015-07-26: 20 mg via ORAL
  Filled 2015-07-25: qty 1

## 2015-07-25 MED ORDER — IPRATROPIUM-ALBUTEROL 0.5-2.5 (3) MG/3ML IN SOLN
3.0000 mL | Freq: Four times a day (QID) | RESPIRATORY_TRACT | Status: DC | PRN
  Administered 2015-07-26: 3 mL via RESPIRATORY_TRACT
  Filled 2015-07-25: qty 3

## 2015-07-25 MED ORDER — AMLODIPINE BESYLATE 5 MG PO TABS: 5.0000 mg | ORAL_TABLET | Freq: Every day | ORAL | Status: DC

## 2015-07-25 MED ORDER — ALPRAZOLAM 0.25 MG PO TABS
0.2500 mg | ORAL_TABLET | Freq: Three times a day (TID) | ORAL | Status: DC | PRN
  Administered 2015-07-25 – 2015-07-26 (×2): 0.25 mg via ORAL
  Filled 2015-07-25 (×2): qty 1

## 2015-07-25 NOTE — Progress Notes (Signed)
SATURATION QUALIFICATIONS: (This note is used to comply with regulatory documentation for home oxygen)  Patient Saturations on Room Air at Rest = 92%  Patient Saturations on Room Air while Ambulating = 86%  Patient Saturations on 2 Liters of oxygen while Ambulating = 93%  Please briefly explain why patient needs home oxygen:

## 2015-07-25 NOTE — Progress Notes (Signed)
[place] [phone]   [phone]  CONSULT NOTE  REFERRING PHYSICIAN: Dr. Simonne Maffucci  REASON FOR CONSULTATION:  82 years old white female recently diagnosed with lung cancer.  HPI Meghan Castro is a 82 y.o. female with past medical history significant for multiple medical problems including COPD, chronic sinusitis, macular degeneration. She has been complaining of worsening dyspnea over the last few weeks. She presented to the ED and CT scan of the chest showed multiple bilateral pulmonary nodules with a dominant mass in the right middle lobe in addition to right hilar and mediastinal lymphadenopathy and innumerable liver metastases. The patient was seen by Dr. Lake Bells and underwent bronchoscopy on 07/24/2015 and the final pathology was consistent with small cell lung cancer. I was asked to see the patient today to discuss her treatment options.  When seen today, she continues to complain of shortness of breath and cough but no significant chest pain or hemoptysis. No recent weight loss or night sweets. She has no fever or chills. No nausea or vomiting. No headache or visual changes.  HPI  Past Medical History  Diagnosis Date  . Chronic airway obstruction, not elsewhere classified   . Unspecified sinusitis (chronic)   . Diaphragmatic hernia without mention of obstruction or gangrene   . Macular degeneration (senile) of retina, unspecified   . Other and unspecified hyperlipidemia   . Unspecified essential hypertension   . Unspecified disorder resulting from impaired renal function   . Mediastinal adenopathy     Past Surgical History  Procedure Laterality Date  . Arm fracture    . Cholecystectomy    . Total abdominal hysterectomy    . Bilateral salpingoophorectomy    . Incisional hernia repair      abdominal  . Appendectomy    . Cataract extraction    . Endobronchial ultrasound Bilateral 07/24/2015    Procedure: ENDOBRONCHIAL  ULTRASOUND;  Surgeon: [name], MD;  Location: [facility];  Service: Cardiopulmonary;  Laterality: Bilateral;    Family History  Problem Relation Age of Onset  . Emphysema Father   . Heart disease Mother   . Lung cancer Father     Social History Social History  Substance Use Topics  . Smoking status: Former Smoker    Quit date: 11/14/1989  . Smokeless tobacco: None  . Alcohol Use: No    Allergies  Allergen Reactions  . Other Shortness Of Breath    *Perfumes*  . Penicillins     Has patient had a PCN reaction causing immediate rash, facial/tongue/throat swelling, SOB or lightheadedness with hypotension: No Has patient had a PCN reaction causing severe rash involving mucus membranes or skin necrosis: No Has patient had a PCN reaction that required hospitalization No Has patient had a PCN reaction occurring within the last 10 years: No If all of the above answers are "NO", then may proceed with Cephalosporin use.   . Pravastatin Sodium     REACTION: swelling in tounge and feet turn black  . Quinolones Other (See Comments)    hyper  . Moxifloxacin Anxiety    Current Facility-Administered Medications  Medication Dose Route Frequency Provider Last Rate Last Dose  . acetaminophen (TYLENOL) tablet 500 mg  500 mg Oral Q6H PRN Silver Huguenin Elgergawy, MD      . albuterol (PROVENTIL) (2.5 MG/3ML) 0.083% nebulizer solution 2.5 mg  2.5 mg Nebulization Q2H PRN Albertine Patricia, MD      . diphenoxylate-atropine (LOMOTIL) 2.5-0.025 MG  per tablet 2 tablet  2 tablet Oral QID PRN Debbe Odea, MD      . heparin injection 5,000 Units  5,000 Units Subcutaneous 3 times per day Albertine Patricia, MD   5,000 Units at 07/25/15 0531  . HYDROcodone-acetaminophen (NORCO/VICODIN) 5-325 MG per tablet 1-2 tablet  1-2 tablet Oral Q4H PRN Silver Huguenin Elgergawy, MD      . ipratropium-albuterol (DUONEB) 0.5-2.5 (3) MG/3ML nebulizer solution 3 mL  3 mL Nebulization Q6H PRN Debbe Odea, MD      .  multivitamin with minerals tablet 1 tablet  1 tablet Oral Daily Albertine Patricia, MD   1 tablet at 07/25/15 1004  . omega-3 acid ethyl esters (LOVAZA) capsule 1 g  1 g Oral Daily Albertine Patricia, MD   1 g at 07/25/15 1004  . ondansetron (ZOFRAN) tablet 4 mg  4 mg Oral Q6H PRN Albertine Patricia, MD       Or  . ondansetron (ZOFRAN) injection 4 mg  4 mg Intravenous Q6H PRN Albertine Patricia, MD      . Derrill Memo ON 07/26/2015] predniSONE (DELTASONE) tablet 20 mg  20 mg Oral Q breakfast Debbe Odea, MD      . tiotropium (SPIRIVA) inhalation capsule 18 mcg  1 capsule Inhalation Daily Albertine Patricia, MD   18 mcg at 07/24/15 3710    Review of Systems  Constitutional: positive for fatigue Eyes: negative Ears, nose, mouth, throat, and face: negative Respiratory: positive for cough, dyspnea on exertion and wheezing Cardiovascular: negative Gastrointestinal: negative Genitourinary:negative Integument/breast: negative Hematologic/lymphatic: negative Musculoskeletal:negative Neurological: negative Behavioral/Psych: negative Endocrine: negative Allergic/Immunologic: negative  Physical Exam  GYI:RSWNI, healthy, no distress, well nourished and well developed SKIN: skin color, texture, turgor are normal, no rashes or significant lesions HEAD: Normocephalic, No masses, lesions, tenderness or abnormalities EYES: normal, PERRLA EARS: External ears normal OROPHARYNX:no exudate, no erythema and lips, buccal mucosa, and tongue normal  NECK: supple, no adenopathy, no JVD LYMPH:  no palpable lymphadenopathy, no hepatosplenomegaly BREAST:not examined LUNGS: prolonged expiratory phase, expiratory wheezes bilaterally, scattered rhonchi bilaterally HEART: regular rate & rhythm, no murmurs and no gallops ABDOMEN:abdomen soft, non-tender, normal bowel sounds and no masses or organomegaly BACK: Back symmetric, no curvature., No CVA tenderness EXTREMITIES:no joint deformities, effusion, or  inflammation, no edema, no skin discoloration  NEURO: alert & oriented x 3 with fluent speech, no focal motor/sensory deficits  PERFORMANCE STATUS: ECOG 1-2  LABORATORY DATA: Lab Results  Component Value Date   WBC 8.4 07/23/2015   HGB 12.6 07/23/2015   HCT 40.1 07/23/2015   MCV 92.6 07/23/2015   PLT 214 07/23/2015    '@LASTCHEM'$ @  RADIOGRAPHIC STUDIES: Dg Chest 2 View  07/20/2015  CLINICAL DATA:  Shortness of breath. EXAM: CHEST  2 VIEW COMPARISON:  March 21, 2015 FINDINGS: No pneumothorax. A large hiatal hernia is again identified. The heart, hila, and mediastinum are unchanged. There are new opacities in the lungs which have increased in the interval. The opacities are more focal in the right perihilar region and rounded/somewhat masslike in the right infrahilar region. These findings are new in the 4 month interval. The opacity is also mildly more prominent in the lateral left lung. The previous study suggested an underlying interstitial process such as fibrosis which persists. No other interval changes or acute abnormalities. IMPRESSION: 1. New bilateral focal pulmonary opacities. The most prominent opacity in the right infrahilar region is rounded and somewhat masslike. Given that these findings are new in the 4  month interval, they may represent an infectious or inflammatory process. A CT scan may better evaluate. At the least, recommend treatment with a short-term follow-up to ensure resolution. Electronically Signed   By: Dorise Bullion III M.D   On: 07/20/2015 14:34   Ct Chest High Resolution  07/20/2015  CLINICAL DATA:  Shortness of breath. Mediastinal adenopathy. COPD. Ex-smoker. EXAM: CT CHEST WITHOUT CONTRAST TECHNIQUE: Multidetector CT imaging of the chest was performed following the standard protocol without intravenous contrast. High resolution imaging of the lungs, as well as inspiratory and expiratory imaging, was performed. COMPARISON:  Multiple chest radiographs including  back to 07/28/2010. No prior CT. FINDINGS: Mediastinum/Lymph Nodes: Tortuous descending thoracic aorta. Normal heart size, without pericardial effusion. Right paratracheal adenopathy at 3.1 x 3.2 cm in image 21/series 3. Subcarinal node measures 2.7 cm on image 27/series 3. Prevascular adenopathy including at 1.3 cm. Complex hiatal hernia, containing the majority of the stomach. No complicating obstruction. Lungs/Pleura: Small right-sided pleural effusion. Right upper lobe pleural-based pulmonary nodule which measures 1.7 x 1.4 cm on image 20/series 10. Minimal motion degradation inferiorly. Right minor fissure thickening/ nodularity at 6 mm on image 25/series 10. Superior segment right lower lobe pulmonary nodule which measures 7 mm on image 22/series 10. Central right middle lobe lung mass with extension into the right upper lobe. This measures 4.7 by 4.6 cm on image 32/series 10 and coronal image 42. Mild left-sided pulmonary nodularity, including on the order of 2 mm in the left lower lobe on image 20/ series 10. High-resolution images demonstrate subpleural reticulation without basilar predominance. No honeycombing. No ground-glass opacity or micro nodularity. Expiratory images demonstrate no significant air trapping. Upper abdomen: Heterogeneous density throughout the liver, most consistent with extensive metastatic disease. Example vague medial segment left liver lobe 4.0 x 3.9 cm lesion on image 50/series 3. Normal imaged portions of the spleen, adrenal glands, and pancreas. Musculoskeletal: Osteopenia. Possible heterogeneous density throughout the sternal manubrium on sagittal image 50. IMPRESSION: 1. Findings most consistent with stage IV primary bronchogenic carcinoma. Multiple lung nodules with a dominant right middle lobe lung mass. Thoracic adenopathy with innumerable liver lesions. 2. Small right pleural effusion. 3. Osteopenia. Heterogeneous density in the sternal manubrium could relate to  heterogeneous osteopenia or infiltrative osseous metastasis. 4. Interstitial lung disease which may represent nonspecific interstitial pneumonitis. 5. Complex large hiatal hernia, without complicating obstruction. These results will be called to the ordering clinician or representative by the Radiologist Assistant, and communication documented in the PACS or zVision Dashboard. Electronically Signed   By: Abigail Miyamoto M.D.   On: 07/20/2015 17:08    ASSESSMENT: This is a very pleasant 82 years old white female recently diagnosed with extensive stage (T2a, N2, M1b) small cell lung cancer presented with large right middle lobe lung mass in addition to mediastinal lymphadenopathy as well as liver metastases diagnosed in March 2017.   PLAN: I had a lengthy discussion with the patient and her family about her current disease stage, prognosis and treatment options. I recommended for the patient to complete the staging workup by ordering a MRI of the brain to rule out brain metastasis. I will also consider the patient for a PET scan on outpatient basis after discharge. I discussed with the patient and her family her prognosis with and without treatment. I gave her the option of palliative care and hospice referral versus consideration of systemic chemotherapy with reduced dose carboplatin and etoposide. The patient is interested in treatment and I will arrange for  her to have a follow-up appointment with me at the [facility] immediately after discharge for more detailed discussion of this option. The patient and her family agreed to the current plan. For the shortness of breath, she will continue on the current inhaler and nebulizer as prescribed by Dr. Lake Bells  The patient voices understanding of current disease status and treatment options and is in agreement with the current care plan.  All questions were answered. The patient knows to call the clinic with any problems, questions or concerns. We can  certainly see the patient much sooner if necessary.  Thank you so much for allowing me to participate in the care of Sharon. I will continue to follow up the patient with you and assist in her care.  Disclaimer: This note was dictated with voice recognition software. Similar sounding words can inadvertently be transcribed and may not be corrected upon review.   Gisele Pack K. July 25, 2015, 1:41 PM

## 2015-07-25 NOTE — Progress Notes (Signed)
Pt declined HHPT at present time.

## 2015-07-25 NOTE — Progress Notes (Signed)
Patient Demographics  Meghan Castro, is a 82 y.o. female, DOB - 04/17/1929, [id number]  Admit date - 07/20/2015   Admitting Physician Albertine Patricia, MD  Outpatient Primary MD for the patient is [name], MD  LOS - 5   Chief Complaint  Patient presents with  . COPD       Admission HPI/Brief narrative: 82 y.o. female, with past medical history of COPD, hypertension, large hiatal hernia, macular degeneration, and renal insufficiency, patient presents to ED with complaints of worsening dyspnea over the last few days, workup significant for COPD, as well as CT chest significant for stage IV pulmonary lung cancer admitted for further workup, COPD improving on IV Solu-Medrol, frequent discontinued as no evidence of pneumonia, and by pulmonary with plan for bronchoscopy with EBUS on Wednesday.  Subjective:   Meghan Castro is still having diarrhea at night- had 2 large BMs last night. Is dyspneic when ambulating. No cough, nausea, vomiting or abdominal pain.   Assessment & Plan    Active Problems:   Hyperlipemia   Essential hypertension   Hypoxia   COPD exacerbation (HCC)   Lung mass   Mediastinal lymphadenopathy  COPD exacerbation  -Patient  with known history of COPD presents with worsening dyspnea - wheezing significantly improved - weaning steroids -Cough is nonproductive- stopped  IV Rocephin and azithromycin 3/6 - assess for home O2 needs  CT chest findings of RML mass, multiple surrounding lung nodules and thoracic adenopathy with liver lesions -Pulmonary consult appreciated - bronchoscopy with EBUS  reveals a completely obstructing, infiltrative mass in bronchus to RML- biopsies reveal Small cell cancer- have notified Dr [name] whom the patient is requesting  Diarrhea - diarrhea is painless- no fever or abdominal tenderness- does not sound infectious- she is not lactose  intolerant and does not believe she is eating anything different at dinner time which would cause it -stop Protonix (which she does not take at home)- she is not on any other medications which could cause it -  if she continues to have diarrhea, have ordered PRN Lomotil  Hypertension  - cont holding losarta given elevated potassium at 5 on admission.  Hyperkalemia - she states she taked K supplements daily and Lasix only PRN for fluid gain- have advised her not to take the K without the Lasix  Dehydration - daily lasix on hold - as mentioned, she takes it PRN at home- no h/o CHF  Hyperlipidemia -Continue with fish oil  Code Status: Full  Family Communication:   Sister  at bedside  Disposition Plan: Home once stable   Procedures  EBUS   Consults   Pulmonary    Medications  Scheduled Meds: . heparin  5,000 Units Subcutaneous 3 times per day  . multivitamin with minerals  1 tablet Oral Daily  . omega-3 acid ethyl esters  1 g Oral Daily  . [START ON 07/26/2015] predniSONE  20 mg Oral Q breakfast  . tiotropium  1 capsule Inhalation Daily   Continuous Infusions:   PRN Meds:.acetaminophen, albuterol, diphenoxylate-atropine, HYDROcodone-acetaminophen, ipratropium-albuterol, ondansetron **OR** ondansetron (ZOFRAN) IV  DVT Prophylaxis  Heparin  Lab Results  Component Value Date   PLT 214 07/23/2015    Antibiotics    Anti-infectives    Start  Dose/Rate Route Frequency Ordered Stop   07/21/15 2000  azithromycin (ZITHROMAX) 500 mg in dextrose 5 % 250 mL IVPB  Status:  Discontinued     500 mg 250 mL/hr over 60 Minutes Intravenous Every 24 hours 07/20/15 1946 07/22/15 0908   07/21/15 1800  cefTRIAXone (ROCEPHIN) 1 g in dextrose 5 % 50 mL IVPB  Status:  Discontinued     1 g 100 mL/hr over 30 Minutes Intravenous Every 24 hours 07/20/15 1946 07/22/15 0908   07/20/15 2000  azithromycin (ZITHROMAX) 500 mg in dextrose 5 % 250 mL IVPB     500 mg 250 mL/hr over 60 Minutes  Intravenous  Once 07/20/15 1901 07/20/15 2117   07/20/15 1545  cefTRIAXone (ROCEPHIN) 1 g in dextrose 5 % 50 mL IVPB     1 g 100 mL/hr over 30 Minutes Intravenous  Once 07/20/15 1543 07/20/15 1850   07/20/15 1545  azithromycin (ZITHROMAX) 500 mg in dextrose 5 % 250 mL IVPB  Status:  Discontinued     500 mg 250 mL/hr over 60 Minutes Intravenous  Once 07/20/15 1543 07/20/15 1901          Objective:   Filed Vitals:   07/24/15 1614 07/24/15 2020 07/24/15 2216 07/25/15 0515  BP: 135/62 141/63  145/72  Pulse: 95 84  76  Temp: 98.3 F (36.8 C) 97.8 F (36.6 C)  97.9 F (36.6 C)  TempSrc: Oral Oral  Oral  Resp:    22  Height:      Weight:      SpO2: 92% 94% 95% 92%    Wt Readings from Last 3 Encounters:  07/21/15 74 kg (163 lb 2.3 oz)  07/16/15 74.027 kg (163 lb 3.2 oz)  06/14/15 75.07 kg (165 lb 8 oz)     Intake/Output Summary (Last 24 hours) at 07/25/15 1522 Last data filed at 07/25/15 0900  Gross per 24 hour  Intake    240 ml  Output    700 ml  Net   -460 ml     Physical Exam  Awake Alert, Oriented X 3, No new F.N deficits, Normal affect [place].AT,PERRAL Supple Neck,No JVD, Symmetrical Chest wall movement, mild crackles in R mid lung field RRR,No Gallops,Rubs or new Murmurs, No Parasternal Heave +ve B.Sounds, Abd Soft, No tenderness, No organomegaly appriciated, No rebound, guarding or rigidity. No Cyanosis, Clubbing or edema, No new Rash or bruise     Data Review   Micro Results Recent Results (from the past 240 hour(s))  Blood culture (routine x 2)     Status: None   Collection Time: 07/20/15  3:45 PM  Result Value Ref Range Status   Specimen Description BLOOD BLOOD LEFT ARM  Final   Special Requests BOTTLES DRAWN AEROBIC AND ANAEROBIC 5CC  Final   Culture   Final    NO GROWTH 5 DAYS Performed at [facility]    Report Status 07/25/2015 FINAL  Final  Blood culture (routine x 2)     Status: None   Collection Time: 07/20/15  5:11 PM  Result Value  Ref Range Status   Specimen Description BLOOD BLOOD RIGHT ARM  Final   Special Requests IN PEDIATRIC BOTTLE 1.5CC  Final   Culture   Final    NO GROWTH 5 DAYS Performed at [facility]    Report Status 07/25/2015 FINAL  Final    Radiology Reports Dg Chest 2 View  07/20/2015  CLINICAL DATA:  Shortness of breath. EXAM: CHEST  2  VIEW COMPARISON:  March 21, 2015 FINDINGS: No pneumothorax. A large hiatal hernia is again identified. The heart, hila, and mediastinum are unchanged. There are new opacities in the lungs which have increased in the interval. The opacities are more focal in the right perihilar region and rounded/somewhat masslike in the right infrahilar region. These findings are new in the 4 month interval. The opacity is also mildly more prominent in the lateral left lung. The previous study suggested an underlying interstitial process such as fibrosis which persists. No other interval changes or acute abnormalities. IMPRESSION: 1. New bilateral focal pulmonary opacities. The most prominent opacity in the right infrahilar region is rounded and somewhat masslike. Given that these findings are new in the 4 month interval, they may represent an infectious or inflammatory process. A CT scan may better evaluate. At the least, recommend treatment with a short-term follow-up to ensure resolution. Electronically Signed   By: Dorise Bullion III M.D   On: 07/20/2015 14:34   Ct Chest High Resolution  07/20/2015  CLINICAL DATA:  Shortness of breath. Mediastinal adenopathy. COPD. Ex-smoker. EXAM: CT CHEST WITHOUT CONTRAST TECHNIQUE: Multidetector CT imaging of the chest was performed following the standard protocol without intravenous contrast. High resolution imaging of the lungs, as well as inspiratory and expiratory imaging, was performed. COMPARISON:  Multiple chest radiographs including back to 07/28/2010. No prior CT. FINDINGS: Mediastinum/Lymph Nodes: Tortuous descending thoracic aorta.  Normal heart size, without pericardial effusion. Right paratracheal adenopathy at 3.1 x 3.2 cm in image 21/series 3. Subcarinal node measures 2.7 cm on image 27/series 3. Prevascular adenopathy including at 1.3 cm. Complex hiatal hernia, containing the majority of the stomach. No complicating obstruction. Lungs/Pleura: Small right-sided pleural effusion. Right upper lobe pleural-based pulmonary nodule which measures 1.7 x 1.4 cm on image 20/series 10. Minimal motion degradation inferiorly. Right minor fissure thickening/ nodularity at 6 mm on image 25/series 10. Superior segment right lower lobe pulmonary nodule which measures 7 mm on image 22/series 10. Central right middle lobe lung mass with extension into the right upper lobe. This measures 4.7 by 4.6 cm on image 32/series 10 and coronal image 42. Mild left-sided pulmonary nodularity, including on the order of 2 mm in the left lower lobe on image 20/ series 10. High-resolution images demonstrate subpleural reticulation without basilar predominance. No honeycombing. No ground-glass opacity or micro nodularity. Expiratory images demonstrate no significant air trapping. Upper abdomen: Heterogeneous density throughout the liver, most consistent with extensive metastatic disease. Example vague medial segment left liver lobe 4.0 x 3.9 cm lesion on image 50/series 3. Normal imaged portions of the spleen, adrenal glands, and pancreas. Musculoskeletal: Osteopenia. Possible heterogeneous density throughout the sternal manubrium on sagittal image 50. IMPRESSION: 1. Findings most consistent with stage IV primary bronchogenic carcinoma. Multiple lung nodules with a dominant right middle lobe lung mass. Thoracic adenopathy with innumerable liver lesions. 2. Small right pleural effusion. 3. Osteopenia. Heterogeneous density in the sternal manubrium could relate to heterogeneous osteopenia or infiltrative osseous metastasis. 4. Interstitial lung disease which may represent  nonspecific interstitial pneumonitis. 5. Complex large hiatal hernia, without complicating obstruction. These results will be called to the ordering clinician or representative by the Radiologist Assistant, and communication documented in the PACS or zVision Dashboard. Electronically Signed   By: Abigail Miyamoto M.D.   On: 07/20/2015 17:08     CBC  Recent Labs Lab 07/20/15 1435 07/21/15 0530 07/23/15 0425  WBC 8.8 3.5* 8.4  HGB 15.1* 13.4 12.6  HCT 46.0 41.0 40.1  PLT 239 175 214  MCV 90.7 89.7 92.6  MCH 29.8 29.3 29.1  MCHC 32.8 32.7 31.4  RDW 14.9 14.9 15.7*    Chemistries   Recent Labs Lab 07/20/15 1435 07/21/15 0530 07/22/15 0447 07/23/15 0425 07/24/15 0450  NA 140 140 138 142 140  K 4.7 5.0 4.8 5.1 5.0  CL 107 110 111 113* 109  CO2 22 20* 19* 18* 21*  GLUCOSE 105* 146* 128* 110* 110*  BUN 21* 19 23* 32* 30*  CREATININE 1.09* 0.85 0.81 0.81 0.73  CALCIUM 9.7 9.6 9.3 9.5 9.1   ------------------------------------------------------------------------------------------------------------------ estimated creatinine clearance is 49.3 mL/min (by C-G formula based on Cr of 0.73). ------------------------------------------------------------------------------------------------------------------ No results for input(s): HGBA1C in the last 72 hours. ------------------------------------------------------------------------------------------------------------------ No results for input(s): CHOL, HDL, LDLCALC, TRIG, CHOLHDL, LDLDIRECT in the last 72 hours. ------------------------------------------------------------------------------------------------------------------ No results for input(s): TSH, T4TOTAL, T3FREE, THYROIDAB in the last 72 hours.  Invalid input(s): FREET3 ------------------------------------------------------------------------------------------------------------------ No results for input(s): VITAMINB12, FOLATE, FERRITIN, TIBC, IRON, RETICCTPCT in the last 72  hours.  Coagulation profile No results for input(s): INR, PROTIME in the last 168 hours.  No results for input(s): DDIMER in the last 72 hours.  Cardiac Enzymes No results for input(s): CKMB, TROPONINI, MYOGLOBIN in the last 168 hours.  Invalid input(s): CK ------------------------------------------------------------------------------------------------------------------ Invalid input(s): POCBNP     Time Spent in minutes   25 minutes   Woodfin Kiss M.D on 07/25/2015 at 3:22 PM  Pager  [email or web address] - password [email or web address]  [facility]   Office  [phone]

## 2015-07-25 NOTE — Progress Notes (Signed)
Nutrition Follow-up  DOCUMENTATION CODES:   Not applicable  INTERVENTION:   - Discontinue Boost Plus and Ensure Enlive per patient request. - Provide patient with Magic Cup once per day, providing 290 kcals and 9 grams of protein. - Continue to encourage PO intakes of meals and supplements. - RD will continue to monitor for needs.  NUTRITION DIAGNOSIS:   Inadequate oral intake related to poor appetite as evidenced by per patient/family report.  Ongoing  GOAL:   Patient will meet greater than or equal to 90% of their needs  Progressing  MONITOR:   PO intake, Weight trends, Labs, I & O's  ASSESSMENT:   82 y.o. female, with past medical history of COPD, hypertension, large hiatal hernia, macular degeneration, and renal insufficiency, patient presents to ED with complaints of worsening dyspnea over the last few days, complains of cough, with a productive thick white sputum, patient denies any sick contacts, fever or chills, in ED was hypoxic 88% on room air, a shunt was supposed to have high resolution CT chest by her primary pulmonary next week, she had it done in ED, CT chest came back significant with multiple lung masses history of stage IV pulmonary lung cancer, given patient's hypoxia, and significant dyspnea she will be admitted for further workup.  S/p bronchoscopy and lymph node biopsy.  Reveals a completely obstructing, infiltrative mass in bronchus to RML.  Biopsies done preliminary diagnosis small cell carcinoma per pulmonology note.   Patient reports that she has no appetite but she has been trying to eat something at each meal.  Reports eating cornflakes, milk, and 1/2 banana this morning for breakfast, 30% meal completion per chart review.  Patient reports that she consumed an egg white omelette for dinner last night.  Family member reports that she has noticed pt making effort to eat throughout the day.  States that she is not consuming Boost Plus or Ensure Enlive  because it was giving her diarrhea.    Reports that she is having difficulty finding things she likes on the Heart Healthy diet, but she has always been a picky eater.  Patient is amenable to having a daily snack and Magic Cup ice cream.  Medications reviewed: MVI.  Labs reviewed.   Diet Order:  Diet Heart Room service appropriate?: Yes; Fluid consistency:: Thin  Skin:  Reviewed, no issues  Last BM:  3/8  Height:   Ht Readings from Last 1 Encounters:  07/20/15 '5\' 2"'$  (1.575 m)    Weight:   Wt Readings from Last 1 Encounters:  07/21/15 163 lb 2.3 oz (74 kg)    Ideal Body Weight:  50 kg (kg)  BMI:  Body mass index is 29.83 kg/(m^2).  Estimated Nutritional Needs:   Kcal:  1800-2000  Protein:  90-100 grams  Fluid:  1.8 L/day  EDUCATION NEEDS:   No education needs identified at this time  Veronda Prude, Dietetic Intern Pager: [phone]

## 2015-07-25 NOTE — Progress Notes (Signed)
[place] Pulmonary and Critical Care Medicine Progress Note  Subjective: She says she is feeling okay, had a little bit of wheezing yesterday but slept all right, still using oxygen  Objective: Filed Vitals:   07/24/15 1614 07/24/15 2020 07/24/15 2216 07/25/15 0515  BP: 135/62 141/63  145/72  Pulse: 95 84  76  Temp: 98.3 F (36.8 C) 97.8 F (36.6 C)  97.9 F (36.6 C)  TempSrc: Oral Oral  Oral  Resp:    22  Height:      Weight:      SpO2: 92% 94% 95% 92%   2 L nasal cannula  Gen.: Sitting up in chair, no distress Pulmonary few crackles bases, no wheezing, good air movement Cardiovascular regular rate and rhythm no murmurs gallops or rubs Belly: Soft, nontender Dermatologic: No edema, warm, no cyanosis Neurologic: Awake alert, oriented 4  Preliminary diagnosis from station 7 (subcarinal) lymph node fine-needle aspiration: Small cell cancer Preliminary diagnosis from right middle lobe medial segment obstructive mass: Small cell cancer  Impression: New diagnosis small cell cancer Advanced COPD Likely interstitial lung disease of uncertain etiology Chronic respiratory failure with hypoxemia  Discussion: Unfortunately Meghan Castro was diagnosed with small cell cancer with her bronchoscopy yesterday. I believe that this in combination with her COPD and likely a interstitial lung disease based on her CT scan and exam findings are all causing her to feel short of breath. I explained to her today that it's reasonable to consider treatment for her cancer but I do not think that treatment of her malignancy will cause her to be any less short of breath. I think that it's time that she go on oxygen as yesterday prior to her procedure she was at O2 saturation of 90% on room air.   Plan: Oncology to see today Home O2 evaluation Continue bronchodilators as prescribed I will communicate her diagnosis to her primary pulmonologist Dr. Salem Caster, MD [place] [facility] Pager:  [phone] Cell: [phone] After 3pm or if no response, call [phone]

## 2015-07-26 DIAGNOSIS — J9601 Acute respiratory failure with hypoxia: Secondary | ICD-10-CM

## 2015-07-26 DIAGNOSIS — C801 Malignant (primary) neoplasm, unspecified: Secondary | ICD-10-CM

## 2015-07-26 DIAGNOSIS — J9600 Acute respiratory failure, unspecified whether with hypoxia or hypercapnia: Secondary | ICD-10-CM

## 2015-07-26 LAB — CBC
HEMATOCRIT: 36.6 % (ref 36.0–46.0)
HEMOGLOBIN: 12.1 g/dL (ref 12.0–15.0)
MCH: 29.2 pg (ref 26.0–34.0)
MCHC: 33.1 g/dL (ref 30.0–36.0)
MCV: 88.2 fL (ref 78.0–100.0)
Platelets: 193 10*3/uL (ref 150–400)
RBC: 4.15 MIL/uL (ref 3.87–5.11)
RDW: 15.3 % (ref 11.5–15.5)
WBC: 8.2 10*3/uL (ref 4.0–10.5)

## 2015-07-26 LAB — BASIC METABOLIC PANEL
ANION GAP: 9 (ref 5–15)
BUN: 34 mg/dL — ABNORMAL HIGH (ref 6–20)
CALCIUM: 9.3 mg/dL (ref 8.9–10.3)
CO2: 23 mmol/L (ref 22–32)
Chloride: 109 mmol/L (ref 101–111)
Creatinine, Ser: 0.75 mg/dL (ref 0.44–1.00)
GFR calc non Af Amer: >60 mL/min (ref >60)
GLUCOSE: 106 mg/dL — AB (ref 65–99)
POTASSIUM: 4.4 mmol/L (ref 3.5–5.1)
Sodium: 141 mmol/L (ref 135–145)

## 2015-07-26 MED ORDER — ALPRAZOLAM 0.25 MG PO TABS: 0.2500 mg | ORAL_TABLET | Freq: Three times a day (TID) | ORAL | Status: DC | PRN

## 2015-07-26 MED ORDER — BUDESONIDE 0.25 MG/2ML IN SUSP
0.2500 mg | Freq: Two times a day (BID) | RESPIRATORY_TRACT | Status: DC
  Administered 2015-07-26: 0.25 mg via RESPIRATORY_TRACT

## 2015-07-26 MED ORDER — PREDNISONE 5 MG PO TABS: ORAL_TABLET | ORAL | Status: DC

## 2015-07-26 NOTE — Progress Notes (Addendum)
Spoke with pt, son and two sisters at bedside concerning discharge plans. Pt asked to stay one more day.  Explained to pt that would be a cost to her, she decided to go home with her sister at ([address] [place], [place]) [phone], [phone]).  Pt selected [facility]. Referral given.

## 2015-07-26 NOTE — Progress Notes (Signed)
[place] Pulmonary and Critical Care Medicine Progress Note  EVENTS 3/8 - ebus - Small cell - extensive statge 3/9 She says she is feeling okay, had a little bit of wheezing yesterday but slept all right, still using oxygen   SUBJECTIVE/OVERNIGHT/INTERVAL HX 3?10/17 - sister at bedside. Reports progressive dyspnea class 3 past few years. First time exertional desats last week in office (does no recollect when tested prior). Now with class 3-4 dyspnea an dneeding 2L [place]. Also has fatigue. Says baseline nephrotic syndrome likely in remissin. Has exacerbation of chronic back pain due to movement for radiology procedures. Pre-admission - ECOG 3 and lvies alone. Son Gwyndolyn Saxon Puckett with lng cancer. Only elderly ssigters for help  Reports Dr Grayland Ormond is offering chemo to start immediately  Objective: Filed Vitals:   07/25/15 0515 07/25/15 1500 07/25/15 2230 07/26/15 0554  BP: 145/72 157/80 138/68 135/60  Pulse: 76 75 73 62  Temp: 97.9 F (36.6 C) 98.1 F (36.7 C) 97.6 F (36.4 C) 98.2 F (36.8 C)  TempSrc: Oral Oral Oral Oral  Resp: '22 24 22 18  '$ Height:      Weight:      SpO2: 92% 93% 98% 99%   2 L nasal cannula  Gen.: lying in bed no distress Pulmonary few crackles bases, no wheezing, good air movement Cardiovascular regular rate and rhythm no murmurs gallops or rubs Belly: Soft, nontender Dermatologic: No edema, warm, no cyanosis Neurologic: Awake alert, oriented 4 Psych: very pleasant but reports spirtiual distress   PULMONARY No results for input(s): PHART, PCO2ART, PO2ART, HCO3, TCO2, O2SAT in the last 168 hours.  Invalid input(s): PCO2, PO2  CBC  Recent Labs Lab 07/21/15 0530 07/23/15 0425 07/26/15 0411  HGB 13.4 12.6 12.1  HCT 41.0 40.1 36.6  WBC 3.5* 8.4 8.2  PLT 175 214 193    COAGULATION No results for input(s): INR in the last 168 hours.  CARDIAC  No results for input(s): TROPONINI in the last 168 hours. No results for input(s): PROBNP in the last 168  hours.   CHEMISTRY  Recent Labs Lab 07/21/15 0530 07/22/15 0447 07/23/15 0425 07/24/15 0450 07/26/15 0411  NA 140 138 142 140 141  K 5.0 4.8 5.1 5.0 4.4  CL 110 111 113* 109 109  CO2 20* 19* 18* 21* 23  GLUCOSE 146* 128* 110* 110* 106*  BUN 19 23* 32* 30* 34*  CREATININE 0.85 0.81 0.81 0.73 0.75  CALCIUM 9.6 9.3 9.5 9.1 9.3   Estimated Creatinine Clearance: 49.3 mL/min (by C-G formula based on Cr of 0.75).   LIVER No results for input(s): AST, ALT, ALKPHOS, BILITOT, PROT, ALBUMIN, INR in the last 168 hours.   INFECTIOUS  Recent Labs Lab 07/20/15 1553 07/20/15 1836  LATICACIDVEN 1.66 1.62     ENDOCRINE CBG (last 3)  No results for input(s): GLUCAP in the last 72 hours.       IMAGING x48h  - image(s) personally visualized  -   highlighted in bold Mr Brain Wo Contrast  07/25/2015  CLINICAL DATA:  Small cell carcinoma.  Lung mass.  Staging. EXAM: MRI HEAD WITHOUT CONTRAST TECHNIQUE: Multiplanar, multiecho pulse sequences of the brain and surrounding structures were obtained without intravenous contrast. The patient refused administration of IV gadolinium, saying that she had been told not to receive contrast due to a previous history of unspecified renal disorder. COMPARISON:  None. FINDINGS: No evidence for acute infarction, hemorrhage, mass lesion, hydrocephalus, or extra-axial fluid. Generalized atrophy. Mild subcortical and periventricular T2 and  FLAIR hyperintensities, likely chronic microvascular ischemic change. Pituitary, pineal, and cerebellar tonsils unremarkable. No upper cervical lesions. Flow voids are maintained throughout the carotid, basilar, and vertebral arteries. There are no areas of chronic hemorrhage. Visualized calvarium, skull base, and upper cervical osseous structures unremarkable. Scalp and extracranial soft tissues, orbits, sinuses, and mastoids show no acute process. BILATERAL cataract extraction. IMPRESSION: The patient refused contrast. Only  noncontrast exam was performed. Sensitivity in the detection of metastatic disease is diminished. Atrophy and small vessel disease. No acute intracranial findings. No mass lesion identified. Electronically Signed   By: Staci Righter M.D.   On: 07/25/2015 20:32       Impression: New diagnosis small cell cancer - extensive stage Advanced COPD Likely interstitial lung disease of uncertain etiology Chronic respiratory failure with hypoxemia  Discussion: ECOG 4 with multiple medicall problems Desiures to undergo chemo High symptom burden - pain, dyspnea, fatiguge Lives alone   Plan: - check pulse ox on rooom air and document  - discussed palliative care consult and home base palliative care -to help through concurent chemo to  Help for symptom mgmt esp pain, fagtigue and goals -> she is EXTREMELY INTERESTED =- continue duoneb - dc home on this (stgop spiriva) - add  pulmicort nebs  PCCM will see again Monday 07/29/15 - if early discharge please send a note to Dr Annamaria Boots for fu arragements    > 50% of this > 30 min visit spent in face to face counseling or/and coordination of care    Dr. Brand Males, M.D., [facility].C.P Pulmonary and Critical Care Medicine Staff Physician [place] [facility] Pager: [phone], If no answer or between  15:00h - 7:00h: call [phone]  07/26/2015 8:22 AM

## 2015-07-26 NOTE — Progress Notes (Signed)
Pt discharged about 3pm in stable condition.  Accompanied home with family members.

## 2015-07-26 NOTE — Discharge Summary (Signed)
Physician Discharge Summary  Meghan Castro IRJ:[id number] DOB: June 19, 1928 DOA: 07/20/2015  PCP: [name], MD  Admit date: 07/20/2015 Discharge date: 07/26/2015  Time spent: 50 minutes  Recommendations for Outpatient Follow-up:  1. Will have home health PT, RN, Aid  Discharge Condition: stable    Discharge Diagnoses:  Principal Problem:   Respiratory failure, acute ([place]) Active Problems:   COPD exacerbation ([place])   Small cell carcinoma ([place])   Hyperlipemia   Essential hypertension   Hypoxia   Lung mass   Mediastinal lymphadenopathy   Diarrhea   History of present illness:  82 y.o. female, with past medical history of COPD, hypertension, large hiatal hernia, macular degeneration, and renal insufficiency, patient presents to ED with complaints of worsening dyspnea over the last few days. Admitted for a COPD exacerbation. Also found to have right lung masses and mediastinal lymphadenopathy. Pulse ox was 88% on room air.   Hospital Course:  Acute respiratory failure - Dr Lake Bells (pulm) believed much of this is from COPD and possible interstitial lung disease. I also believe it is likely due to lug mass obstructing her airway and deconditioning. He is recommending to continue bronchodilators.   COPD exacerbation  - follows with Dr Annamaria Boots -Patient with known history of COPD presents with worsening dyspnea - wheezing significantly improved - weaning steroids- cont home inhalers -Cough is nonproductive- stopped IV Rocephin and azithromycin 3/6 - will need 2 L home O2 - pulse ox 92% at rest and 86% when ambulating  CT chest findings of RML mass, multiple surrounding lung nodules, thoracic adenopathy with liver lesions -Pulmonary consult appreciated - bronchoscopy with EBUS reveals a completely obstructing, infiltrative mass in bronchus to RML- biopsies reveal Small cell cancer (report not in EPIC) - have notified Dr [name] whom the patient was requesting to see- he has  discussed starting chemo which she is wanting to do- will have outpt PET scan- MRI head done (without contrast as patient refused it...)- no metastasis seen  Diarrhea - diarrhea, oddly, occuring at night and is painless- no fever or abdominal tenderness- does not sound infectious- she is not lactose intolerant and does not believe she is eating anything different at dinner time which would cause it -stopped Protonix (which she does not take at home)- she is not on any other medications which could cause it - diarrhea resolved  Hypertension - cont holding losartan given elevated potassium at 5 on admission. - BP not terribly high and for her age, would no aggressively control it  Hyperkalemia - she states she taked K supplements daily and Lasix only PRN for fluid gain- have advised her never to take the K without the Lasix - Losartan on hold as well  Dehydration - daily lasix on hold - as mentioned, she takes it PRN at home- no h/o CHF - PO intake has been poor, BUN Cr elevated suggestive of dehydration and no signs of fluid overload throughout her hospital stay and therefore, will not be resuming Lasix  Hyperlipidemia -Continue with fish oil   Procedures:  EBUS with biopsies  Consultations:  PCCM  Oncology   Discharge Exam: [facility] Weights   07/21/15 1822  Weight: 74 kg (163 lb 2.3 oz)   Filed Vitals:   07/25/15 2230 07/26/15 0554  BP: 138/68 135/60  Pulse: 73 62  Temp: 97.6 F (36.4 C) 98.2 F (36.8 C)  Resp: 22 18    General: AAO x 3, no distress Cardiovascular: RRR, no murmurs  Respiratory: clear to auscultation  bilaterally GI: soft, non-tender, non-distended, bowel sound positive  Discharge Instructions You were cared for by a hospitalist during your hospital stay. If you have any questions about your discharge medications or the care you received while you were in the hospital after you are discharged, you can call the unit and asked to speak with the  hospitalist on call if the hospitalist that took care of you is not available. Once you are discharged, your primary care physician will handle any further medical issues. Please note that NO REFILLS for any discharge medications will be authorized once you are discharged, as it is imperative that you return to your primary care physician (or establish a relationship with a primary care physician if you do not have one) for your aftercare needs so that they can reassess your need for medications and monitor your lab values.      Discharge Instructions    Diet - low sodium heart healthy    Complete by:  As directed      Increase activity slowly    Complete by:  As directed             Medication List    STOP taking these medications        furosemide 40 MG tablet  Commonly known as:  LASIX     losartan 50 MG tablet  Commonly known as:  COZAAR     potassium chloride 10 MEQ tablet  Commonly known as:  K-DUR,KLOR-CON      TAKE these medications        acetaminophen 500 MG tablet  Commonly known as:  TYLENOL  Take 500 mg by mouth every 6 (six) hours as needed for mild pain or moderate pain.     aspirin 325 MG EC tablet  Take 325 mg by mouth daily.     Fish Oil 1000 MG Caps  Take 1 capsule by mouth daily.     Ginger Root 500 MG Caps  Take 2 capsules by mouth daily.     multivitamin with minerals Tabs tablet  Take 1 tablet by mouth daily.     predniSONE 5 MG tablet  Commonly known as:  DELTASONE  10 mg tomorrow and 5 mg for the next day     PROAIR HFA 108 (90 Base) MCG/ACT inhaler  Generic drug:  albuterol  INHALE 2 PUFFS INTO THE LUNGS EVERY 4 (FOUR) HOURS AS NEEDED FOR WHEEZING OR SHORTNESS OF BREATH.     Tiotropium Bromide Monohydrate 2.5 MCG/ACT Aers  Commonly known as:  SPIRIVA RESPIMAT  Inhale 2 puffs into the lungs daily.       Allergies  Allergen Reactions  . Other Shortness Of Breath    *Perfumes*  . Penicillins     Has patient had a PCN reaction  causing immediate rash, facial/tongue/throat swelling, SOB or lightheadedness with hypotension: No Has patient had a PCN reaction causing severe rash involving mucus membranes or skin necrosis: No Has patient had a PCN reaction that required hospitalization No Has patient had a PCN reaction occurring within the last 10 years: No If all of the above answers are "NO", then may proceed with Cephalosporin use.   . Pravastatin Sodium     REACTION: swelling in tounge and feet turn black  . Quinolones Other (See Comments)    hyper  . Moxifloxacin Anxiety      The results of significant diagnostics from this hospitalization (including imaging, microbiology, ancillary and laboratory) are listed below for reference.  Significant Diagnostic Studies: Dg Chest 2 View  07/20/2015  CLINICAL DATA:  Shortness of breath. EXAM: CHEST  2 VIEW COMPARISON:  March 21, 2015 FINDINGS: No pneumothorax. A large hiatal hernia is again identified. The heart, hila, and mediastinum are unchanged. There are new opacities in the lungs which have increased in the interval. The opacities are more focal in the right perihilar region and rounded/somewhat masslike in the right infrahilar region. These findings are new in the 4 month interval. The opacity is also mildly more prominent in the lateral left lung. The previous study suggested an underlying interstitial process such as fibrosis which persists. No other interval changes or acute abnormalities. IMPRESSION: 1. New bilateral focal pulmonary opacities. The most prominent opacity in the right infrahilar region is rounded and somewhat masslike. Given that these findings are new in the 4 month interval, they may represent an infectious or inflammatory process. A CT scan may better evaluate. At the least, recommend treatment with a short-term follow-up to ensure resolution. Electronically Signed   By: Dorise Bullion III M.D   On: 07/20/2015 14:34   Mr Brain Wo  Contrast  07/25/2015  CLINICAL DATA:  Small cell carcinoma.  Lung mass.  Staging. EXAM: MRI HEAD WITHOUT CONTRAST TECHNIQUE: Multiplanar, multiecho pulse sequences of the brain and surrounding structures were obtained without intravenous contrast. The patient refused administration of IV gadolinium, saying that she had been told not to receive contrast due to a previous history of unspecified renal disorder. COMPARISON:  None. FINDINGS: No evidence for acute infarction, hemorrhage, mass lesion, hydrocephalus, or extra-axial fluid. Generalized atrophy. Mild subcortical and periventricular T2 and FLAIR hyperintensities, likely chronic microvascular ischemic change. Pituitary, pineal, and cerebellar tonsils unremarkable. No upper cervical lesions. Flow voids are maintained throughout the carotid, basilar, and vertebral arteries. There are no areas of chronic hemorrhage. Visualized calvarium, skull base, and upper cervical osseous structures unremarkable. Scalp and extracranial soft tissues, orbits, sinuses, and mastoids show no acute process. BILATERAL cataract extraction. IMPRESSION: The patient refused contrast. Only noncontrast exam was performed. Sensitivity in the detection of metastatic disease is diminished. Atrophy and small vessel disease. No acute intracranial findings. No mass lesion identified. Electronically Signed   By: Staci Righter M.D.   On: 07/25/2015 20:32   Ct Chest High Resolution  07/20/2015  CLINICAL DATA:  Shortness of breath. Mediastinal adenopathy. COPD. Ex-smoker. EXAM: CT CHEST WITHOUT CONTRAST TECHNIQUE: Multidetector CT imaging of the chest was performed following the standard protocol without intravenous contrast. High resolution imaging of the lungs, as well as inspiratory and expiratory imaging, was performed. COMPARISON:  Multiple chest radiographs including back to 07/28/2010. No prior CT. FINDINGS: Mediastinum/Lymph Nodes: Tortuous descending thoracic aorta. Normal heart size,  without pericardial effusion. Right paratracheal adenopathy at 3.1 x 3.2 cm in image 21/series 3. Subcarinal node measures 2.7 cm on image 27/series 3. Prevascular adenopathy including at 1.3 cm. Complex hiatal hernia, containing the majority of the stomach. No complicating obstruction. Lungs/Pleura: Small right-sided pleural effusion. Right upper lobe pleural-based pulmonary nodule which measures 1.7 x 1.4 cm on image 20/series 10. Minimal motion degradation inferiorly. Right minor fissure thickening/ nodularity at 6 mm on image 25/series 10. Superior segment right lower lobe pulmonary nodule which measures 7 mm on image 22/series 10. Central right middle lobe lung mass with extension into the right upper lobe. This measures 4.7 by 4.6 cm on image 32/series 10 and coronal image 42. Mild left-sided pulmonary nodularity, including on the order of 2 mm in the left  lower lobe on image 20/ series 10. High-resolution images demonstrate subpleural reticulation without basilar predominance. No honeycombing. No ground-glass opacity or micro nodularity. Expiratory images demonstrate no significant air trapping. Upper abdomen: Heterogeneous density throughout the liver, most consistent with extensive metastatic disease. Example vague medial segment left liver lobe 4.0 x 3.9 cm lesion on image 50/series 3. Normal imaged portions of the spleen, adrenal glands, and pancreas. Musculoskeletal: Osteopenia. Possible heterogeneous density throughout the sternal manubrium on sagittal image 50. IMPRESSION: 1. Findings most consistent with stage IV primary bronchogenic carcinoma. Multiple lung nodules with a dominant right middle lobe lung mass. Thoracic adenopathy with innumerable liver lesions. 2. Small right pleural effusion. 3. Osteopenia. Heterogeneous density in the sternal manubrium could relate to heterogeneous osteopenia or infiltrative osseous metastasis. 4. Interstitial lung disease which may represent nonspecific  interstitial pneumonitis. 5. Complex large hiatal hernia, without complicating obstruction. These results will be called to the ordering clinician or representative by the Radiologist Assistant, and communication documented in the PACS or zVision Dashboard. Electronically Signed   By: Abigail Miyamoto M.D.   On: 07/20/2015 17:08    Microbiology: Recent Results (from the past 240 hour(s))  Blood culture (routine x 2)     Status: None   Collection Time: 07/20/15  3:45 PM  Result Value Ref Range Status   Specimen Description BLOOD BLOOD LEFT ARM  Final   Special Requests BOTTLES DRAWN AEROBIC AND ANAEROBIC 5CC  Final   Culture   Final    NO GROWTH 5 DAYS Performed at [facility]    Report Status 07/25/2015 FINAL  Final  Blood culture (routine x 2)     Status: None   Collection Time: 07/20/15  5:11 PM  Result Value Ref Range Status   Specimen Description BLOOD BLOOD RIGHT ARM  Final   Special Requests IN PEDIATRIC BOTTLE 1.5CC  Final   Culture   Final    NO GROWTH 5 DAYS Performed at [facility]    Report Status 07/25/2015 FINAL  Final     Labs: Basic Metabolic Panel:  Recent Labs Lab 07/21/15 0530 07/22/15 0447 07/23/15 0425 07/24/15 0450 07/26/15 0411  NA 140 138 142 140 141  K 5.0 4.8 5.1 5.0 4.4  CL 110 111 113* 109 109  CO2 20* 19* 18* 21* 23  GLUCOSE 146* 128* 110* 110* 106*  BUN 19 23* 32* 30* 34*  CREATININE 0.85 0.81 0.81 0.73 0.75  CALCIUM 9.6 9.3 9.5 9.1 9.3   Liver Function Tests: No results for input(s): AST, ALT, ALKPHOS, BILITOT, PROT, ALBUMIN in the last 168 hours. No results for input(s): LIPASE, AMYLASE in the last 168 hours. No results for input(s): AMMONIA in the last 168 hours. CBC:  Recent Labs Lab 07/20/15 1435 07/21/15 0530 07/23/15 0425 07/26/15 0411  WBC 8.8 3.5* 8.4 8.2  HGB 15.1* 13.4 12.6 12.1  HCT 46.0 41.0 40.1 36.6  MCV 90.7 89.7 92.6 88.2  PLT 239 175 214 193   Cardiac Enzymes: No results for input(s): CKTOTAL,  CKMB, CKMBINDEX, TROPONINI in the last 168 hours. BNP: BNP (last 3 results) No results for input(s): BNP in the last 8760 hours.  ProBNP (last 3 results) No results for input(s): PROBNP in the last 8760 hours.  CBG: No results for input(s): GLUCAP in the last 168 hours.     SignedDebbe Odea, MD [facility] 07/26/2015, 10:47 AM

## 2015-07-26 NOTE — Progress Notes (Signed)
[facility] is providing the following services: Home oxygen HHPT/Nursing/HHA  If patient discharges after hours, please call [phone].   Linward Headland 07/26/2015, 11:30 AM

## 2015-07-29 ENCOUNTER — Encounter: Payer: Self-pay | Admitting: Internal Medicine

## 2015-07-29 ENCOUNTER — Telehealth: Payer: Self-pay | Admitting: Internal Medicine

## 2015-07-29 NOTE — Telephone Encounter (Signed)
hosp f/u - 03/15 @ 2:30 w/Dr. [name].

## 2015-07-30 ENCOUNTER — Other Ambulatory Visit: Payer: Self-pay | Admitting: Internal Medicine

## 2015-07-30 ENCOUNTER — Encounter: Payer: Self-pay | Admitting: *Deleted

## 2015-07-30 ENCOUNTER — Telehealth: Payer: Self-pay | Admitting: Internal Medicine

## 2015-07-30 ENCOUNTER — Telehealth: Payer: Self-pay | Admitting: *Deleted

## 2015-07-30 DIAGNOSIS — C801 Malignant (primary) neoplasm, unspecified: Secondary | ICD-10-CM

## 2015-07-30 NOTE — Telephone Encounter (Signed)
I called patient to update of PET has been ordered but need to wait on precert and then central scheduling will call with appt. I asked that she call the cancer center with any other needs or concerns .

## 2015-07-30 NOTE — Telephone Encounter (Signed)
Central radiology scheduling will call patient re pet scan. No other orders per 3/14 pof.

## 2015-07-30 NOTE — Progress Notes (Signed)
Oncology Nurse Navigator Documentation  Oncology Nurse Navigator Flowsheets 07/30/2015  Navigator Encounter Type Other/I received vm message from triage about PET scan for Meghan Castro.  I spoke with Dr. [name].  He stated he will order.  I will call and update family.   Acuity Level 1  Time Spent with Patient 15

## 2015-07-30 NOTE — Telephone Encounter (Signed)
FYI "This is her sister Dare Tinnin.  We received a call about tomorrow's appointment with Dr. [name] but did not receive call for PET scan appointment.  In the Hospital he said she will need a scan." Message left on ext [phone] and will route this request.  Instructed to come tomorrow as scheduled.  If any new orders we will call.  Dare will attend tomorrow's appointment.  Advised all release of information forms be signed at registration tomorrow.

## 2015-07-31 ENCOUNTER — Ambulatory Visit (HOSPITAL_BASED_OUTPATIENT_CLINIC_OR_DEPARTMENT_OTHER): Payer: Medicare Other | Admitting: Internal Medicine

## 2015-07-31 ENCOUNTER — Telehealth: Payer: Self-pay | Admitting: Internal Medicine

## 2015-07-31 ENCOUNTER — Encounter: Payer: Self-pay | Admitting: Internal Medicine

## 2015-07-31 VITALS — BP 125/67 | HR 103 | Temp 98.5°F | Resp 18 | Ht 62.0 in | Wt 165.3 lb

## 2015-07-31 DIAGNOSIS — M8580 Other specified disorders of bone density and structure, unspecified site: Secondary | ICD-10-CM | POA: Diagnosis not present

## 2015-07-31 DIAGNOSIS — C3491 Malignant neoplasm of unspecified part of right bronchus or lung: Secondary | ICD-10-CM

## 2015-07-31 DIAGNOSIS — E46 Unspecified protein-calorie malnutrition: Secondary | ICD-10-CM

## 2015-07-31 DIAGNOSIS — C787 Secondary malignant neoplasm of liver and intrahepatic bile duct: Secondary | ICD-10-CM | POA: Diagnosis not present

## 2015-07-31 DIAGNOSIS — R630 Anorexia: Secondary | ICD-10-CM

## 2015-07-31 HISTORY — DX: Malignant neoplasm of unspecified part of right bronchus or lung: C34.91

## 2015-07-31 MED ORDER — PROCHLORPERAZINE MALEATE 10 MG PO TABS: 10.0000 mg | ORAL_TABLET | Freq: Four times a day (QID) | ORAL | Status: DC | PRN

## 2015-07-31 NOTE — Telephone Encounter (Signed)
Gave and printed appt sched and avs for pt for March and April °

## 2015-07-31 NOTE — Progress Notes (Signed)
[facility] [phone]   [phone]  OFFICE PROGRESS NOTE  [name], MD [place] [address]  DIAGNOSIS: Extensive stage (T2a, N2, M1b) small cell lung cancer presented with right upper lobe lung mass in addition to mediastinal lymphadenopathy and liver metastases diagnosed in March 2017.  PRIOR THERAPY: None  CURRENT THERAPY: Systemic chemotherapy with carboplatin for AUC of 5 on day 1 and etoposide 100 MG/M2 on days 1, 2 and 3 with Neulasta support on day 4. First cycle will start on 08/06/2015 was reduced dose of carboplatin for AUC of 4 and etoposide 90 MG/M2.  INTERVAL HISTORY: Meghan Castro 82 y.o. female returns to the clinic today for follow-up visit accompanied by several family members. The patient was recently diagnosed with extensive stage small cell lung cancer during her hospitalization at [facility] when she presented with worsening dyspnea and she underwent bronchoscopy under the care of Dr. Lake Bells and the final pathology was consistent with small cell carcinoma. She had MRI of the brain performed during her admission that showed no clear evidence for metastatic disease to the brain. The patient continues to have shortness of breath and mild cough but no significant chest pain or hemoptysis. She denied having any significant weight loss or night sweats. She denied having any nausea or vomiting, no fever or chills. She is here today for evaluation and discussion of her treatment options.  MEDICAL HISTORY: Past Medical History  Diagnosis Date  . Chronic airway obstruction, not elsewhere classified   . Unspecified sinusitis (chronic)   . Diaphragmatic hernia without mention of obstruction or gangrene   . Macular degeneration (senile) of retina, unspecified   . Other and unspecified hyperlipidemia   . Unspecified essential hypertension   . Unspecified disorder resulting from impaired renal  function   . Mediastinal adenopathy     ALLERGIES:  is allergic to other; penicillins; pravastatin sodium; quinolones; and moxifloxacin.  MEDICATIONS:  Current Outpatient Prescriptions  Medication Sig Dispense Refill  . acetaminophen (TYLENOL) 500 MG tablet Take 500 mg by mouth every 6 (six) hours as needed for mild pain or moderate pain.    Marland Kitchen ALPRAZolam (XANAX) 0.25 MG tablet Take 1 tablet (0.25 mg total) by mouth 3 (three) times daily as needed for anxiety. 30 tablet 0  . aspirin 325 MG EC tablet Take 325 mg by mouth daily.      . Ginger, Zingiber officinalis, (GINGER ROOT) 500 MG CAPS Take 2 capsules by mouth daily.      . Multiple Vitamin (MULTIVITAMIN WITH MINERALS) TABS tablet Take 1 tablet by mouth daily.    . Omega-3 Fatty Acids (FISH OIL) 1000 MG CAPS Take 1 capsule by mouth daily.    . predniSONE (DELTASONE) 5 MG tablet 10 mg tomorrow and 5 mg for the next day 3 tablet 0  . PROAIR HFA 108 (90 BASE) MCG/ACT inhaler INHALE 2 PUFFS INTO THE LUNGS EVERY 4 (FOUR) HOURS AS NEEDED FOR WHEEZING OR SHORTNESS OF BREATH. 8.5 Inhaler 0  . Tiotropium Bromide Monohydrate (SPIRIVA RESPIMAT) 2.5 MCG/ACT AERS Inhale 2 puffs into the lungs daily. 1 Inhaler 12   No current facility-administered medications for this visit.    SURGICAL HISTORY:  Past Surgical History  Procedure Laterality Date  . Arm fracture    . Cholecystectomy    . Total abdominal hysterectomy    . Bilateral salpingoophorectomy    . Incisional hernia repair      abdominal  .  Appendectomy    . Cataract extraction    . Endobronchial ultrasound Bilateral 07/24/2015    Procedure: ENDOBRONCHIAL ULTRASOUND;  Surgeon: [name], MD;  Location: [facility];  Service: Cardiopulmonary;  Laterality: Bilateral;    REVIEW OF SYSTEMS:  Constitutional: positive for anorexia and fatigue Eyes: negative Ears, nose, mouth, throat, and face: negative Respiratory: positive for dyspnea on exertion Cardiovascular:  negative Gastrointestinal: negative Genitourinary:negative Integument/breast: negative Hematologic/lymphatic: negative Musculoskeletal:negative Neurological: negative Behavioral/Psych: negative Endocrine: negative Allergic/Immunologic: negative   PHYSICAL EXAMINATION: General appearance: alert, cooperative, fatigued and no distress Head: Normocephalic, without obvious abnormality, atraumatic Neck: no adenopathy, no JVD, supple, symmetrical, trachea midline and thyroid not enlarged, symmetric, no tenderness/mass/nodules Lymph nodes: Cervical, supraclavicular, and axillary nodes normal. Resp: wheezes bilaterally Back: symmetric, no curvature. ROM normal. No CVA tenderness. Cardio: regular rate and rhythm, S1, S2 normal, no murmur, click, rub or gallop and normal apical impulse GI: soft, non-tender; bowel sounds normal; no masses,  no organomegaly Extremities: extremities normal, atraumatic, no cyanosis or edema Neurologic: Alert and oriented X 3, normal strength and tone. Normal symmetric reflexes. Normal coordination and gait  ECOG PERFORMANCE STATUS: 1 - Symptomatic but completely ambulatory  Blood pressure 125/67, pulse 103, temperature 98.5 F (36.9 C), temperature source Oral, resp. rate 18, height '5\' 2"'$  (1.575 m), weight 165 lb 4.8 oz (74.98 kg), SpO2 95 %.  LABORATORY DATA: Lab Results  Component Value Date   WBC 8.2 07/26/2015   HGB 12.1 07/26/2015   HCT 36.6 07/26/2015   MCV 88.2 07/26/2015   PLT 193 07/26/2015      Chemistry      Component Value Date/Time   NA 141 07/26/2015 0411   K 4.4 07/26/2015 0411   CL 109 07/26/2015 0411   CO2 23 07/26/2015 0411   BUN 34* 07/26/2015 0411   CREATININE 0.75 07/26/2015 0411      Component Value Date/Time   CALCIUM 9.3 07/26/2015 0411   ALKPHOS 75 06/14/2015 2205   AST 38 06/14/2015 2205   ALT 26 06/14/2015 2205   BILITOT 0.9 06/14/2015 2205       RADIOGRAPHIC STUDIES: Dg Chest 2 View  07/20/2015  CLINICAL DATA:   Shortness of breath. EXAM: CHEST  2 VIEW COMPARISON:  March 21, 2015 FINDINGS: No pneumothorax. A large hiatal hernia is again identified. The heart, hila, and mediastinum are unchanged. There are new opacities in the lungs which have increased in the interval. The opacities are more focal in the right perihilar region and rounded/somewhat masslike in the right infrahilar region. These findings are new in the 4 month interval. The opacity is also mildly more prominent in the lateral left lung. The previous study suggested an underlying interstitial process such as fibrosis which persists. No other interval changes or acute abnormalities. IMPRESSION: 1. New bilateral focal pulmonary opacities. The most prominent opacity in the right infrahilar region is rounded and somewhat masslike. Given that these findings are new in the 4 month interval, they may represent an infectious or inflammatory process. A CT scan may better evaluate. At the least, recommend treatment with a short-term follow-up to ensure resolution. Electronically Signed   By: Dorise Bullion III M.D   On: 07/20/2015 14:34   Mr Brain Wo Contrast  07/25/2015  CLINICAL DATA:  Small cell carcinoma.  Lung mass.  Staging. EXAM: MRI HEAD WITHOUT CONTRAST TECHNIQUE: Multiplanar, multiecho pulse sequences of the brain and surrounding structures were obtained without intravenous contrast. The patient refused administration of IV gadolinium, saying that  she had been told not to receive contrast due to a previous history of unspecified renal disorder. COMPARISON:  None. FINDINGS: No evidence for acute infarction, hemorrhage, mass lesion, hydrocephalus, or extra-axial fluid. Generalized atrophy. Mild subcortical and periventricular T2 and FLAIR hyperintensities, likely chronic microvascular ischemic change. Pituitary, pineal, and cerebellar tonsils unremarkable. No upper cervical lesions. Flow voids are maintained throughout the carotid, basilar, and vertebral  arteries. There are no areas of chronic hemorrhage. Visualized calvarium, skull base, and upper cervical osseous structures unremarkable. Scalp and extracranial soft tissues, orbits, sinuses, and mastoids show no acute process. BILATERAL cataract extraction. IMPRESSION: The patient refused contrast. Only noncontrast exam was performed. Sensitivity in the detection of metastatic disease is diminished. Atrophy and small vessel disease. No acute intracranial findings. No mass lesion identified. Electronically Signed   By: Staci Righter M.D.   On: 07/25/2015 20:32   Ct Chest High Resolution  07/20/2015  CLINICAL DATA:  Shortness of breath. Mediastinal adenopathy. COPD. Ex-smoker. EXAM: CT CHEST WITHOUT CONTRAST TECHNIQUE: Multidetector CT imaging of the chest was performed following the standard protocol without intravenous contrast. High resolution imaging of the lungs, as well as inspiratory and expiratory imaging, was performed. COMPARISON:  Multiple chest radiographs including back to 07/28/2010. No prior CT. FINDINGS: Mediastinum/Lymph Nodes: Tortuous descending thoracic aorta. Normal heart size, without pericardial effusion. Right paratracheal adenopathy at 3.1 x 3.2 cm in image 21/series 3. Subcarinal node measures 2.7 cm on image 27/series 3. Prevascular adenopathy including at 1.3 cm. Complex hiatal hernia, containing the majority of the stomach. No complicating obstruction. Lungs/Pleura: Small right-sided pleural effusion. Right upper lobe pleural-based pulmonary nodule which measures 1.7 x 1.4 cm on image 20/series 10. Minimal motion degradation inferiorly. Right minor fissure thickening/ nodularity at 6 mm on image 25/series 10. Superior segment right lower lobe pulmonary nodule which measures 7 mm on image 22/series 10. Central right middle lobe lung mass with extension into the right upper lobe. This measures 4.7 by 4.6 cm on image 32/series 10 and coronal image 42. Mild left-sided pulmonary nodularity,  including on the order of 2 mm in the left lower lobe on image 20/ series 10. High-resolution images demonstrate subpleural reticulation without basilar predominance. No honeycombing. No ground-glass opacity or micro nodularity. Expiratory images demonstrate no significant air trapping. Upper abdomen: Heterogeneous density throughout the liver, most consistent with extensive metastatic disease. Example vague medial segment left liver lobe 4.0 x 3.9 cm lesion on image 50/series 3. Normal imaged portions of the spleen, adrenal glands, and pancreas. Musculoskeletal: Osteopenia. Possible heterogeneous density throughout the sternal manubrium on sagittal image 50. IMPRESSION: 1. Findings most consistent with stage IV primary bronchogenic carcinoma. Multiple lung nodules with a dominant right middle lobe lung mass. Thoracic adenopathy with innumerable liver lesions. 2. Small right pleural effusion. 3. Osteopenia. Heterogeneous density in the sternal manubrium could relate to heterogeneous osteopenia or infiltrative osseous metastasis. 4. Interstitial lung disease which may represent nonspecific interstitial pneumonitis. 5. Complex large hiatal hernia, without complicating obstruction. These results will be called to the ordering clinician or representative by the Radiologist Assistant, and communication documented in the PACS or zVision Dashboard. Electronically Signed   By: Abigail Miyamoto M.D.   On: 07/20/2015 17:08    ASSESSMENT AND PLAN: This is a very pleasant 82 years old white female recently diagnosed with extensive stage small cell lung cancer with liver metastasis. I had a lengthy discussion with the patient and her family about her current disease stage, prognosis and treatment options. The patient  understands that she has incurable condition in order the treatment will be off palliative nature. I will complete the staging workup by ordering a PET scan. I gave her the option of palliative care and hospice  referral versus consideration of palliative systemic chemotherapy with carboplatin initially had reduced dose of AUC of 4 on day 1 and etoposide 90 MG/M2 on days 1, 2 and 3 with Neulasta support on day 4. If the patient tolerated the first cycle well with no significant adverse effects, I may increase her dose to carboplatin for AUC of 5 and etoposide 100 MG/M2 starting from cycle #2. She is interested in proceeding with systemic chemotherapy. I discussed with the patient adverse effect of the chemotherapy including but not limited to alopecia, myelosuppression, nausea and vomiting, peripheral neuropathy, liver or renal dysfunction. She is expected to start the first cycle of this treatment on 08/06/2015. I will arrange for the patient to have a chemotherapy education class before starting the first dose of her treatment. I will also call her pharmacy with prescription for Compazine 10 mg by mouth every 6 hours as needed for nausea. The patient would come back for follow-up visit in 2 weeks for reevaluation and management of any adverse effect of her treatment. She was advised to call immediately if she has any concerning symptoms in the interval. For the lack of appetite and malnutrition, I will refer the patient to the dietitian at the [facility] for evaluation of her condition. The patient voices understanding of current disease status and treatment options and is in agreement with the current care plan.  All questions were answered. The patient knows to call the clinic with any problems, questions or concerns. We can certainly see the patient much sooner if necessary.  I spent 20 minutes counseling the patient face to face. The total time spent in the appointment was 30 minutes.  Disclaimer: This note was dictated with voice recognition software. Similar sounding words can inadvertently be transcribed and may not be corrected upon review.

## 2015-08-02 ENCOUNTER — Other Ambulatory Visit: Payer: Medicare Other

## 2015-08-02 ENCOUNTER — Other Ambulatory Visit: Payer: Self-pay | Admitting: General Surgery

## 2015-08-02 ENCOUNTER — Encounter: Payer: Self-pay | Admitting: *Deleted

## 2015-08-02 ENCOUNTER — Other Ambulatory Visit: Payer: Self-pay | Admitting: Internal Medicine

## 2015-08-02 DIAGNOSIS — C3492 Malignant neoplasm of unspecified part of left bronchus or lung: Secondary | ICD-10-CM

## 2015-08-05 ENCOUNTER — Encounter (HOSPITAL_COMMUNITY): Payer: Self-pay

## 2015-08-05 ENCOUNTER — Other Ambulatory Visit: Payer: Self-pay | Admitting: Internal Medicine

## 2015-08-05 ENCOUNTER — Ambulatory Visit (HOSPITAL_COMMUNITY)
Admission: RE | Admit: 2015-08-05 | Discharge: 2015-08-05 | Disposition: A | Discharge status: Home / Self Care | Payer: Medicare Other | Source: Ambulatory Visit | Attending: Internal Medicine | Admitting: Internal Medicine

## 2015-08-05 DIAGNOSIS — I10 Essential (primary) hypertension: Secondary | ICD-10-CM | POA: Diagnosis not present

## 2015-08-05 DIAGNOSIS — C787 Secondary malignant neoplasm of liver and intrahepatic bile duct: Secondary | ICD-10-CM | POA: Diagnosis not present

## 2015-08-05 DIAGNOSIS — J449 Chronic obstructive pulmonary disease, unspecified: Secondary | ICD-10-CM | POA: Insufficient documentation

## 2015-08-05 DIAGNOSIS — C3411 Malignant neoplasm of upper lobe, right bronchus or lung: Secondary | ICD-10-CM | POA: Insufficient documentation

## 2015-08-05 DIAGNOSIS — Z7982 Long term (current) use of aspirin: Secondary | ICD-10-CM | POA: Diagnosis not present

## 2015-08-05 DIAGNOSIS — K449 Diaphragmatic hernia without obstruction or gangrene: Secondary | ICD-10-CM | POA: Diagnosis not present

## 2015-08-05 DIAGNOSIS — C3492 Malignant neoplasm of unspecified part of left bronchus or lung: Secondary | ICD-10-CM

## 2015-08-05 DIAGNOSIS — Z87891 Personal history of nicotine dependence: Secondary | ICD-10-CM | POA: Diagnosis not present

## 2015-08-05 DIAGNOSIS — Z79899 Other long term (current) drug therapy: Secondary | ICD-10-CM | POA: Diagnosis not present

## 2015-08-05 HISTORY — DX: Malignant (primary) neoplasm, unspecified: C80.1

## 2015-08-05 LAB — CBC
HEMATOCRIT: 41.5 % (ref 36.0–46.0)
HEMOGLOBIN: 13 g/dL (ref 12.0–15.0)
MCH: 29.2 pg (ref 26.0–34.0)
MCHC: 31.3 g/dL (ref 30.0–36.0)
MCV: 93.3 fL (ref 78.0–100.0)
Platelets: 194 10*3/uL (ref 150–400)
RBC: 4.45 MIL/uL (ref 3.87–5.11)
RDW: 15.9 % — ABNORMAL HIGH (ref 11.5–15.5)
WBC: 6.9 10*3/uL (ref 4.0–10.5)

## 2015-08-05 LAB — APTT: aPTT: 27 seconds (ref 24–37)

## 2015-08-05 LAB — PROTIME-INR
INR: 1.01 (ref 0.00–1.49)
PROTHROMBIN TIME: 13.5 s (ref 11.6–15.2)

## 2015-08-05 MED ORDER — MIDAZOLAM HCL 2 MG/2ML IJ SOLN
INTRAMUSCULAR | Status: AC
  Filled 2015-08-05: qty 4

## 2015-08-05 MED ORDER — MIDAZOLAM HCL 2 MG/2ML IJ SOLN
INTRAMUSCULAR | Status: AC | PRN
  Administered 2015-08-05 (×4): 0.5 mg via INTRAVENOUS

## 2015-08-05 MED ORDER — HEPARIN SOD (PORK) LOCK FLUSH 100 UNIT/ML IV SOLN
INTRAVENOUS | Status: AC
  Filled 2015-08-05: qty 5

## 2015-08-05 MED ORDER — FENTANYL CITRATE (PF) 100 MCG/2ML IJ SOLN
INTRAMUSCULAR | Status: AC | PRN
  Administered 2015-08-05 (×4): 25 ug via INTRAVENOUS

## 2015-08-05 MED ORDER — IOHEXOL 300 MG/ML  SOLN: 10.0000 mL | Freq: Once | INTRAMUSCULAR | Status: DC | PRN

## 2015-08-05 MED ORDER — SODIUM CHLORIDE 0.9 % IV SOLN
INTRAVENOUS | Status: DC
  Administered 2015-08-05: 10:00:00 via INTRAVENOUS

## 2015-08-05 MED ORDER — HEPARIN SOD (PORK) LOCK FLUSH 100 UNIT/ML IV SOLN
INTRAVENOUS | Status: AC | PRN
  Administered 2015-08-05: 500 [IU]

## 2015-08-05 MED ORDER — LIDOCAINE HCL 1 % IJ SOLN
INTRAMUSCULAR | Status: AC | PRN
  Administered 2015-08-05: 20 mL

## 2015-08-05 MED ORDER — FENTANYL CITRATE (PF) 100 MCG/2ML IJ SOLN
INTRAMUSCULAR | Status: AC
  Filled 2015-08-05: qty 2

## 2015-08-05 MED ORDER — VANCOMYCIN HCL IN DEXTROSE 1-5 GM/200ML-% IV SOLN
1000.0000 mg | INTRAVENOUS | Status: AC
  Administered 2015-08-05: 1000 mg via INTRAVENOUS
  Filled 2015-08-05: qty 200

## 2015-08-05 MED ORDER — LIDOCAINE HCL 1 % IJ SOLN
INTRAMUSCULAR | Status: AC
  Filled 2015-08-05: qty 20

## 2015-08-05 NOTE — Sedation Documentation (Signed)
Patient denies pain and is resting comfortably.  

## 2015-08-05 NOTE — Discharge Instructions (Signed)
Moderate Conscious Sedation, Adult, Care After °Refer to this sheet in the next few weeks. These instructions provide you with information on caring for yourself after your procedure. Your health care provider may also give you more specific instructions. Your treatment has been planned according to current medical practices, but problems sometimes occur. Call your health care provider if you have any problems or questions after your procedure. °WHAT TO EXPECT AFTER THE PROCEDURE  °After your procedure: °· You may feel sleepy, clumsy, and have poor balance for several hours. °· Vomiting may occur if you eat too soon after the procedure. °HOME CARE INSTRUCTIONS °· Do not participate in any activities where you could become injured for at least 24 hours. Do not: °¨ Drive. °¨ Swim. °¨ Ride a bicycle. °¨ Operate heavy machinery. °¨ Cook. °¨ Use power tools. °¨ Climb ladders. °¨ Work from a high place. °· Do not make important decisions or sign legal documents until you are improved. °· If you vomit, drink water, juice, or soup when you can drink without vomiting. Make sure you have little or no nausea before eating solid foods. °· Only take over-the-counter or prescription medicines for pain, discomfort, or fever as directed by your health care provider. °· Make sure you and your family fully understand everything about the medicines given to you, including what side effects may occur. °· You should not drink alcohol, take sleeping pills, or take medicines that cause drowsiness for at least 24 hours. °· If you smoke, do not smoke without supervision. °· If you are feeling better, you may resume normal activities 24 hours after you were sedated. °· Keep all appointments with your health care provider. °SEEK MEDICAL CARE IF: °· Your skin is pale or bluish in color. °· You continue to feel nauseous or vomit. °· Your pain is getting worse and is not helped by medicine. °· You have bleeding or swelling. °· You are still  sleepy or feeling clumsy after 24 hours. °SEEK IMMEDIATE MEDICAL CARE IF: °· You develop a rash. °· You have difficulty breathing. °· You develop any type of allergic problem. °· You have a fever. °MAKE SURE YOU: °· Understand these instructions. °· Will watch your condition. °· Will get help right away if you are not doing well or get worse. °  °This information is not intended to replace advice given to you by your health care provider. Make sure you discuss any questions you have with your health care provider. °  °Document Released: 02/22/2013 Document Revised: 05/25/2014 Document Reviewed: 02/22/2013 °Elsevier Interactive Patient Education ©2016 [employer] °Implanted Port Insertion, Care After °Refer to this sheet in the next few weeks. These instructions provide you with information on caring for yourself after your procedure. Your health care provider may also give you more specific instructions. Your treatment has been planned according to current medical practices, but problems sometimes occur. Call your health care provider if you have any problems or questions after your procedure. °WHAT TO EXPECT AFTER THE PROCEDURE °After your procedure, it is typical to have the following:  °· Discomfort at the port insertion site. Ice packs to the area will help. °· Bruising on the skin over the port. This will subside in 3-4 days. °HOME CARE INSTRUCTIONS °· After your port is placed, you will get a manufacturer's information card. The card has information about your port. Keep this card with you at all times.   °· Know what kind of port you have. There are many types   of ports available.   °· Wear a medical alert bracelet in case of an emergency. This can help alert health care workers that you have a port.   °· The port can stay in for as long as your health care provider believes it is necessary.   °· A home health care nurse may give medicines and take care of the port.   °· You or a family member can get  special training and directions for giving medicine and taking care of the port at home.   °SEEK MEDICAL CARE IF:  °· Your port does not flush or you are unable to get a blood return.   °· You have a fever or chills. °SEEK IMMEDIATE MEDICAL CARE IF: °· You have new fluid or pus coming from your incision.   °· You notice a bad smell coming from your incision site.   °· You have swelling, pain, or more redness at the incision or port site.   °· You have chest pain or shortness of breath. °  °This information is not intended to replace advice given to you by your health care provider. Make sure you discuss any questions you have with your health care provider. °  °Document Released: 02/22/2013 Document Revised: 05/09/2013 Document Reviewed: 02/22/2013 °Elsevier Interactive Patient Education ©2016 [employer] °Implanted Port Home Guide °An implanted port is a type of central line that is placed under the skin. Central lines are used to provide IV access when treatment or nutrition needs to be given through a person's veins. Implanted ports are used for long-term IV access. An implanted port may be placed because:  °· You need IV medicine that would be irritating to the small veins in your hands or arms.   °· You need long-term IV medicines, such as antibiotics.   °· You need IV nutrition for a long period.   °· You need frequent blood draws for lab tests.   °· You need dialysis.   °Implanted ports are usually placed in the chest area, but they can also be placed in the upper arm, the abdomen, or the leg. An implanted port has two main parts:  °· Reservoir. The reservoir is round and will appear as a small, raised area under your skin. The reservoir is the part where a needle is inserted to give medicines or draw blood.   °· Catheter. The catheter is a thin, flexible tube that extends from the reservoir. The catheter is placed into a large vein. Medicine that is inserted into the reservoir goes into the catheter and  then into the vein.   °HOW WILL I CARE FOR MY INCISION SITE? °Do not get the incision site wet. Bathe or shower as directed by your health care provider.  °HOW IS MY PORT ACCESSED? °Special steps must be taken to access the port:  °· Before the port is accessed, a numbing cream can be placed on the skin. This helps numb the skin over the port site.   °· Your health care provider uses a sterile technique to access the port. °· Your health care provider must put on a mask and sterile gloves. °· The skin over your port is cleaned carefully with an antiseptic and allowed to dry. °· The port is gently pinched between sterile gloves, and a needle is inserted into the port. °· Only "non-coring" port needles should be used to access the port. Once the port is accessed, a blood return should be checked. This helps ensure that the port is in the vein and is not clogged.   °· If your port   needs to remain accessed for a constant infusion, a clear (transparent) bandage will be placed over the needle site. The bandage and needle will need to be changed every week, or as directed by your health care provider.   °· Keep the bandage covering the needle clean and dry. Do not get it wet. Follow your health care provider's instructions on how to take a shower or bath while the port is accessed.   °· If your port does not need to stay accessed, no bandage is needed over the port.   °WHAT IS FLUSHING? °Flushing helps keep the port from getting clogged. Follow your health care provider's instructions on how and when to flush the port. Ports are usually flushed with saline solution or a medicine called heparin. The need for flushing will depend on how the port is used.  °· If the port is used for intermittent medicines or blood draws, the port will need to be flushed:   °· After medicines have been given.   °· After blood has been drawn.   °· As part of routine maintenance.   °· If a constant infusion is running, the port may not need to  be flushed.   °HOW LONG WILL MY PORT STAY IMPLANTED? °The port can stay in for as long as your health care provider thinks it is needed. When it is time for the port to come out, surgery will be done to remove it. The procedure is similar to the one performed when the port was put in.  °WHEN SHOULD I SEEK IMMEDIATE MEDICAL CARE? °When you have an implanted port, you should seek immediate medical care if:  °· You notice a bad smell coming from the incision site.   °· You have swelling, redness, or drainage at the incision site.   °· You have more swelling or pain at the port site or the surrounding area.   °· You have a fever that is not controlled with medicine. °  °This information is not intended to replace advice given to you by your health care provider. Make sure you discuss any questions you have with your health care provider. °  °Document Released: 05/04/2005 Document Revised: 02/22/2013 Document Reviewed: 01/09/2013 °Elsevier Interactive Patient Education ©2016 [employer] Inc. ° °

## 2015-08-05 NOTE — Procedures (Signed)
Interventional Radiology Procedure Note  Procedure: Placement of a left IJ approach single lumen PowerPort.  Tip is positioned at the distal brachiocephalic vein/atrial confluence and catheter is ready for immediate use.  Complications: None  Findings: Occluded Right IJ vein. Engorged Left Brachiocephalic Vein, tortuous  Recommendations:  - Ok to shower tomorrow - Do not submerge for 7 days - Routine line care   Signed,  Dulcy Fanny. Earleen Newport, DO

## 2015-08-05 NOTE — H&P (Signed)
Chief Complaint: Patient was seen in consultation today for Port-A-Cath placement  Referring Physician(s): Mohamed,Mohamed  Supervising Physician: Corrie Mckusick  History of Present Illness: Meghan Castro is a 82 y.o. female with history of recently diagnosed extensive stage small cell lung cancer who presented with right upper lobe lung mass in addition to mediastinal lymphadenopathy and liver metastasis in March 2017. She presents today for Port-A-Cath placement for chemotherapy.  Past Medical History  Diagnosis Date  . Chronic airway obstruction, not elsewhere classified   . Unspecified sinusitis (chronic)   . Diaphragmatic hernia without mention of obstruction or gangrene   . Macular degeneration (senile) of retina, unspecified   . Other and unspecified hyperlipidemia   . Unspecified essential hypertension   . Unspecified disorder resulting from impaired renal function   . Mediastinal adenopathy   . Cancer [facility])     metastatic lung cancer    Past Surgical History  Procedure Laterality Date  . Arm fracture    . Cholecystectomy    . Total abdominal hysterectomy    . Bilateral salpingoophorectomy    . Incisional hernia repair      abdominal  . Appendectomy    . Cataract extraction    . Endobronchial ultrasound Bilateral 07/24/2015    Procedure: ENDOBRONCHIAL ULTRASOUND;  Surgeon: [name], MD;  Location: [facility];  Service: Cardiopulmonary;  Laterality: Bilateral;    Allergies: Other; Penicillins; Pravastatin sodium; Quinolones; and Moxifloxacin  Medications: Prior to Admission medications   Medication Sig Start Date End Date Taking? Authorizing Provider  acetaminophen (TYLENOL) 500 MG tablet Take 500 mg by mouth every 6 (six) hours as needed for mild pain or moderate pain.   Yes Historical Provider, MD  ALPRAZolam (XANAX) 0.25 MG tablet Take 1 tablet (0.25 mg total) by mouth 3 (three) times daily as needed for anxiety. 07/26/15  Yes Debbe Odea, MD    Ginger, Zingiber officinalis, (GINGER ROOT) 500 MG CAPS Take 2 capsules by mouth daily.     Yes Historical Provider, MD  Multiple Vitamin (MULTIVITAMIN WITH MINERALS) TABS tablet Take 1 tablet by mouth daily.   Yes Historical Provider, MD  Omega-3 Fatty Acids (FISH OIL) 1000 MG CAPS Take 1 capsule by mouth daily.   Yes Historical Provider, MD  PROAIR HFA 108 (90 BASE) MCG/ACT inhaler INHALE 2 PUFFS INTO THE LUNGS EVERY 4 (FOUR) HOURS AS NEEDED FOR WHEEZING OR SHORTNESS OF BREATH. 04/19/15  Yes Deneise Lever, MD  Tiotropium Bromide Monohydrate (SPIRIVA RESPIMAT) 2.5 MCG/ACT AERS Inhale 2 puffs into the lungs daily. 05/17/15  Yes Deneise Lever, MD  aspirin 325 MG EC tablet Take 325 mg by mouth daily.      Historical Provider, MD  predniSONE (DELTASONE) 5 MG tablet 10 mg tomorrow and 5 mg for the next day 07/26/15   Debbe Odea, MD  prochlorperazine (COMPAZINE) 10 MG tablet Take 1 tablet (10 mg total) by mouth every 6 (six) hours as needed for nausea or vomiting. 07/31/15   Curt Bears, MD     Family History  Problem Relation Age of Onset  . Emphysema Father   . Heart disease Mother   . Lung cancer Father     Social History   Social History  . Marital Status: Widowed    Spouse Name: N/A  . Number of Children: N/A  . Years of Education: N/A   Occupational History  . 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  Smoking status: Former Smoker    Quit date: 11/14/1989  . Smokeless tobacco: Not on file  . Alcohol Use: No  . Drug Use: No  . Sexual Activity: Not on file   Other Topics Concern  . Not on file   Social History Narrative      Review of Systems  Constitutional: Positive for fatigue. Negative for fever and chills.  Respiratory: Positive for shortness of breath.        Occ cough  Cardiovascular: Negative for chest pain.  Gastrointestinal: Negative for nausea, vomiting, abdominal pain and blood in stool.  Genitourinary:  Negative for dysuria and hematuria.  Musculoskeletal: Positive for back pain.  Neurological:       Occ HA's    Vital Signs: BP 114/66 mmHg  Pulse 83  Temp(Src) 97.6 F (36.4 C) (Oral)  Resp 20  SpO2 96%  Physical Exam  Constitutional: She is oriented to person, place, and time. She appears well-developed and well-nourished.  Cardiovascular: Normal rate and regular rhythm.   Pulmonary/Chest: Effort normal.  Distant BS bilat  Abdominal: Soft. Bowel sounds are normal. There is no tenderness.  Musculoskeletal: Normal range of motion.  Neurological: She is alert and oriented to person, place, and time.    Mallampati Score:     Imaging: Dg Chest 2 View  07/20/2015  CLINICAL DATA:  Shortness of breath. EXAM: CHEST  2 VIEW COMPARISON:  March 21, 2015 FINDINGS: No pneumothorax. A large hiatal hernia is again identified. The heart, hila, and mediastinum are unchanged. There are new opacities in the lungs which have increased in the interval. The opacities are more focal in the right perihilar region and rounded/somewhat masslike in the right infrahilar region. These findings are new in the 4 month interval. The opacity is also mildly more prominent in the lateral left lung. The previous study suggested an underlying interstitial process such as fibrosis which persists. No other interval changes or acute abnormalities. IMPRESSION: 1. New bilateral focal pulmonary opacities. The most prominent opacity in the right infrahilar region is rounded and somewhat masslike. Given that these findings are new in the 4 month interval, they may represent an infectious or inflammatory process. A CT scan may better evaluate. At the least, recommend treatment with a short-term follow-up to ensure resolution. Electronically Signed   By: Dorise Bullion III M.D   On: 07/20/2015 14:34   Mr Brain Wo Contrast  07/25/2015  CLINICAL DATA:  Small cell carcinoma.  Lung mass.  Staging. EXAM: MRI HEAD WITHOUT CONTRAST  TECHNIQUE: Multiplanar, multiecho pulse sequences of the brain and surrounding structures were obtained without intravenous contrast. The patient refused administration of IV gadolinium, saying that she had been told not to receive contrast due to a previous history of unspecified renal disorder. COMPARISON:  None. FINDINGS: No evidence for acute infarction, hemorrhage, mass lesion, hydrocephalus, or extra-axial fluid. Generalized atrophy. Mild subcortical and periventricular T2 and FLAIR hyperintensities, likely chronic microvascular ischemic change. Pituitary, pineal, and cerebellar tonsils unremarkable. No upper cervical lesions. Flow voids are maintained throughout the carotid, basilar, and vertebral arteries. There are no areas of chronic hemorrhage. Visualized calvarium, skull base, and upper cervical osseous structures unremarkable. Scalp and extracranial soft tissues, orbits, sinuses, and mastoids show no acute process. BILATERAL cataract extraction. IMPRESSION: The patient refused contrast. Only noncontrast exam was performed. Sensitivity in the detection of metastatic disease is diminished. Atrophy and small vessel disease. No acute intracranial findings. No mass lesion identified. Electronically Signed   By: Staci Righter  M.D.   On: 07/25/2015 20:32   Ct Chest High Resolution  07/20/2015  CLINICAL DATA:  Shortness of breath. Mediastinal adenopathy. COPD. Ex-smoker. EXAM: CT CHEST WITHOUT CONTRAST TECHNIQUE: Multidetector CT imaging of the chest was performed following the standard protocol without intravenous contrast. High resolution imaging of the lungs, as well as inspiratory and expiratory imaging, was performed. COMPARISON:  Multiple chest radiographs including back to 07/28/2010. No prior CT. FINDINGS: Mediastinum/Lymph Nodes: Tortuous descending thoracic aorta. Normal heart size, without pericardial effusion. Right paratracheal adenopathy at 3.1 x 3.2 cm in image 21/series 3. Subcarinal node  measures 2.7 cm on image 27/series 3. Prevascular adenopathy including at 1.3 cm. Complex hiatal hernia, containing the majority of the stomach. No complicating obstruction. Lungs/Pleura: Small right-sided pleural effusion. Right upper lobe pleural-based pulmonary nodule which measures 1.7 x 1.4 cm on image 20/series 10. Minimal motion degradation inferiorly. Right minor fissure thickening/ nodularity at 6 mm on image 25/series 10. Superior segment right lower lobe pulmonary nodule which measures 7 mm on image 22/series 10. Central right middle lobe lung mass with extension into the right upper lobe. This measures 4.7 by 4.6 cm on image 32/series 10 and coronal image 42. Mild left-sided pulmonary nodularity, including on the order of 2 mm in the left lower lobe on image 20/ series 10. High-resolution images demonstrate subpleural reticulation without basilar predominance. No honeycombing. No ground-glass opacity or micro nodularity. Expiratory images demonstrate no significant air trapping. Upper abdomen: Heterogeneous density throughout the liver, most consistent with extensive metastatic disease. Example vague medial segment left liver lobe 4.0 x 3.9 cm lesion on image 50/series 3. Normal imaged portions of the spleen, adrenal glands, and pancreas. Musculoskeletal: Osteopenia. Possible heterogeneous density throughout the sternal manubrium on sagittal image 50. IMPRESSION: 1. Findings most consistent with stage IV primary bronchogenic carcinoma. Multiple lung nodules with a dominant right middle lobe lung mass. Thoracic adenopathy with innumerable liver lesions. 2. Small right pleural effusion. 3. Osteopenia. Heterogeneous density in the sternal manubrium could relate to heterogeneous osteopenia or infiltrative osseous metastasis. 4. Interstitial lung disease which may represent nonspecific interstitial pneumonitis. 5. Complex large hiatal hernia, without complicating obstruction. These results will be called to  the ordering clinician or representative by the Radiologist Assistant, and communication documented in the PACS or zVision Dashboard. Electronically Signed   By: Abigail Miyamoto M.D.   On: 07/20/2015 17:08    Labs:  CBC:  Recent Labs  07/21/15 0530 07/23/15 0425 07/26/15 0411 08/05/15 0755  WBC 3.5* 8.4 8.2 6.9  HGB 13.4 12.6 12.1 13.0  HCT 41.0 40.1 36.6 41.5  PLT 175 214 193 194    COAGS:  Recent Labs  08/05/15 0755  INR 1.01  APTT 27    BMP:  Recent Labs  07/22/15 0447 07/23/15 0425 07/24/15 0450 07/26/15 0411  NA 138 142 140 141  K 4.8 5.1 5.0 4.4  CL 111 113* 109 109  CO2 19* 18* 21* 23  GLUCOSE 128* 110* 110* 106*  BUN 23* 32* 30* 34*  CALCIUM 9.3 9.5 9.1 9.3  CREATININE 0.81 0.81 0.73 0.75  GFRNONAA >60 >60 >60 >60  GFRAA >60 >60 >60 >60    LIVER FUNCTION TESTS:  Recent Labs  06/14/15 2205  BILITOT 0.9  AST 38  ALT 26  ALKPHOS 75  PROT 7.4  ALBUMIN 4.0    TUMOR MARKERS: No results for input(s): AFPTM, CEA, CA199, CHROMGRNA in the last 8760 hours.  Assessment and Plan: 82 y.o. female with history of  recently diagnosed extensive stage small cell lung cancer who presented with right upper lobe lung mass in addition to mediastinal lymphadenopathy and liver metastasis in March 2017. She presents today for Port-A-Cath placement for chemotherapy.Risks and benefits discussed with the patient/sister including, but not limited to bleeding, infection, pneumothorax, or fibrin sheath development and need for additional procedures.All of the patient's questions were answered, patient is agreeable to proceed.Consent signed and in chart.     Thank you for this interesting consult.  I greatly enjoyed meeting Meghan Castro and look forward to participating in their care.  A copy of this report was sent to the requesting provider on this date.  Electronically Signed: D. Rowe Robert 08/05/2015, 9:03 AM   I spent a total of 15 minutes  in face to face in  clinical consultation, greater than 50% of which was counseling/coordinating care for port a cath placement

## 2015-08-06 ENCOUNTER — Other Ambulatory Visit: Payer: Self-pay | Admitting: Medical Oncology

## 2015-08-06 ENCOUNTER — Ambulatory Visit (HOSPITAL_BASED_OUTPATIENT_CLINIC_OR_DEPARTMENT_OTHER): Payer: Medicare Other

## 2015-08-06 ENCOUNTER — Other Ambulatory Visit: Payer: Self-pay | Admitting: *Deleted

## 2015-08-06 ENCOUNTER — Ambulatory Visit: Payer: Medicare Other

## 2015-08-06 ENCOUNTER — Other Ambulatory Visit (HOSPITAL_BASED_OUTPATIENT_CLINIC_OR_DEPARTMENT_OTHER): Payer: Medicare Other

## 2015-08-06 VITALS — BP 120/48 | HR 78 | Temp 98.6°F | Resp 20

## 2015-08-06 DIAGNOSIS — C3491 Malignant neoplasm of unspecified part of right bronchus or lung: Secondary | ICD-10-CM

## 2015-08-06 DIAGNOSIS — R748 Abnormal levels of other serum enzymes: Secondary | ICD-10-CM

## 2015-08-06 DIAGNOSIS — C801 Malignant (primary) neoplasm, unspecified: Secondary | ICD-10-CM

## 2015-08-06 DIAGNOSIS — Z95828 Presence of other vascular implants and grafts: Secondary | ICD-10-CM

## 2015-08-06 LAB — CBC WITH DIFFERENTIAL/PLATELET
BASO%: 0.2 % (ref 0.0–2.0)
Basophils Absolute: 0 10*3/uL (ref 0.0–0.1)
EOS%: 1.2 % (ref 0.0–7.0)
Eosinophils Absolute: 0.1 10*3/uL (ref 0.0–0.5)
HEMATOCRIT: 39.1 % (ref 34.8–46.6)
HGB: 12.9 g/dL (ref 11.6–15.9)
LYMPH#: 1 10*3/uL (ref 0.9–3.3)
LYMPH%: 11.8 % — ABNORMAL LOW (ref 14.0–49.7)
MCH: 29.9 pg (ref 25.1–34.0)
MCHC: 33 g/dL (ref 31.5–36.0)
MCV: 90.7 fL (ref 79.5–101.0)
MONO#: 1 10*3/uL — ABNORMAL HIGH (ref 0.1–0.9)
MONO%: 12.2 % (ref 0.0–14.0)
NEUT#: 6.2 10*3/uL (ref 1.5–6.5)
NEUT%: 74.6 % (ref 38.4–76.8)
Platelets: 191 10*3/uL (ref 145–400)
RBC: 4.31 10*6/uL (ref 3.70–5.45)
RDW: 15.8 % — ABNORMAL HIGH (ref 11.2–14.5)
WBC: 8.3 10*3/uL (ref 3.9–10.3)

## 2015-08-06 LAB — COMPREHENSIVE METABOLIC PANEL
ALT: 169 U/L — AB (ref 0–55)
AST: 180 U/L — AB (ref 5–34)
Albumin: 2.9 g/dL — ABNORMAL LOW (ref 3.5–5.0)
Alkaline Phosphatase: 326 U/L — ABNORMAL HIGH (ref 40–150)
Anion Gap: 9 mEq/L (ref 3–11)
BUN: 21.7 mg/dL (ref 7.0–26.0)
CALCIUM: 9.9 mg/dL (ref 8.4–10.4)
CHLORIDE: 105 meq/L (ref 98–109)
CO2: 27 meq/L (ref 22–29)
CREATININE: 0.8 mg/dL (ref 0.6–1.1)
EGFR: 65 mL/min/{1.73_m2} — ABNORMAL LOW (ref >90)
Glucose: 106 mg/dl (ref 70–140)
Potassium: 4.6 mEq/L (ref 3.5–5.1)
Sodium: 141 mEq/L (ref 136–145)
Total Bilirubin: 0.95 mg/dL (ref 0.20–1.20)
Total Protein: 6.5 g/dL (ref 6.4–8.3)

## 2015-08-06 MED ORDER — LIDOCAINE-PRILOCAINE 2.5-2.5 % EX CREA: 1.0000 "application " | TOPICAL_CREAM | CUTANEOUS | Status: AC | PRN

## 2015-08-06 MED ORDER — SODIUM CHLORIDE 0.9% FLUSH
10.0000 mL | INTRAVENOUS | Status: DC | PRN
  Administered 2015-08-06: 10 mL via INTRAVENOUS
  Filled 2015-08-06: qty 10

## 2015-08-06 MED ORDER — SODIUM CHLORIDE 0.9 % IV SOLN: 90.0000 mg/m2 | Freq: Once | INTRAVENOUS | Status: DC

## 2015-08-06 MED ORDER — SODIUM CHLORIDE 0.9 % IV SOLN: Freq: Once | INTRAVENOUS | Status: DC

## 2015-08-06 MED ORDER — HEPARIN SOD (PORK) LOCK FLUSH 100 UNIT/ML IV SOLN
500.0000 [IU] | Freq: Once | INTRAVENOUS | Status: AC | PRN
Start: 2015-08-06 — End: 2015-08-06
  Administered 2015-08-06: 500 [IU]
  Filled 2015-08-06: qty 5

## 2015-08-06 MED ORDER — SODIUM CHLORIDE 0.9 % IV SOLN
10.0000 mg | Freq: Once | INTRAVENOUS | Status: AC
  Administered 2015-08-06: 10 mg via INTRAVENOUS
  Filled 2015-08-06: qty 1

## 2015-08-06 MED ORDER — PALONOSETRON HCL INJECTION 0.25 MG/5ML
INTRAVENOUS | Status: AC
  Filled 2015-08-06: qty 5

## 2015-08-06 MED ORDER — PALONOSETRON HCL INJECTION 0.25 MG/5ML
0.2500 mg | Freq: Once | INTRAVENOUS | Status: AC
  Administered 2015-08-06: 0.25 mg via INTRAVENOUS

## 2015-08-06 MED ORDER — SODIUM CHLORIDE 0.9% FLUSH
10.0000 mL | INTRAVENOUS | Status: DC | PRN
  Administered 2015-08-06: 10 mL
  Filled 2015-08-06: qty 10

## 2015-08-06 MED ORDER — SODIUM CHLORIDE 0.9 % IV SOLN: 298.4000 mg | Freq: Once | INTRAVENOUS | Status: DC

## 2015-08-06 NOTE — Progress Notes (Signed)
Noted that kliver enzymes greatly elevated. Spoke with Dr. Worthy Flank nurse, who will speak with Dr. Irene Limbo as Dr. [name] is not in the office at this time. Awaiting call back from Dr. Irene Limbo. Stanton Kidney, RN with Dr. [name] followed up with Dr. Benay Spice and decision made to hold treatment today and the next 2 days. Mary, RN spoke with pt and her family. Pt is scheduled to have PET Scan on 08/12/15 and then see Dr. [name] on 08/13/15.  Pt and family voiced understanding of current situation.  Pt's son and her sister present.  Port flushed and de-accessed prior to d/c home.

## 2015-08-06 NOTE — Patient Instructions (Signed)

## 2015-08-07 ENCOUNTER — Telehealth: Payer: Self-pay | Admitting: Medical Oncology

## 2015-08-07 ENCOUNTER — Ambulatory Visit: Payer: Medicare Other

## 2015-08-07 NOTE — Telephone Encounter (Signed)
Nurse stated pt wants Hospice.  I called pt and she does not want hospice. She was told that if she went on hospice "it would cancel her insurance".  I explained to pt if she went on hospice she would not be able to receive chemotherapy if [facility] ordered it and pt stated she does not want hospice . She wants to have PET scan and f/u with mohamed as scheduled.

## 2015-08-08 ENCOUNTER — Ambulatory Visit: Payer: Medicare Other

## 2015-08-08 ENCOUNTER — Telehealth: Payer: Self-pay | Admitting: *Deleted

## 2015-08-08 ENCOUNTER — Telehealth: Payer: Self-pay | Admitting: Internal Medicine

## 2015-08-08 MED ORDER — PREDNISONE 10 MG PO TABS: 10.0000 mg | ORAL_TABLET | Freq: Every day | ORAL | Status: DC

## 2015-08-08 NOTE — Telephone Encounter (Signed)
Spoke with pt and sister, aware of recs.  rx sent to preferred pharmacy.  Nothing further needed.

## 2015-08-08 NOTE — Telephone Encounter (Signed)
Pt gets 02 through [facility].  Called [facility], states these are sold in retail stores but cannot be ordered and billed through insurance.   Pt is not working with hospice at this time d/t her chemotherapy tx with Dr. Earlie Server.  Pt does not take any prednisone.  Was given 2 days worth of prednisone when she was released from the hospital at her last office visit.   CY please advise if there is anything further needed aside from prednisone.  Thanks.  (prednisone will need to be sent after CY's reply)

## 2015-08-08 NOTE — Telephone Encounter (Signed)
What DME provides her oxygen?   Also, is she working with a [place]?  Can they provide her a pulse oximeter?   Our goal is O2 saturation 89 to 94%   Ok to run O2 2- 4 L/M as needed  Is she on prednisone now?   If not, we can order prednisone 10 mg, # 30, one daily

## 2015-08-08 NOTE — Telephone Encounter (Signed)
Suggest they get pulse oximeter at a drug store

## 2015-08-08 NOTE — Telephone Encounter (Signed)
Spoke with pt's son Gwyndolyn Saxon, states that pt is having increased SOB starting today. Denies chest pain, fever, nausea, chills.   Pt has increased 02 from 2lpm to 2.5lpm d/t her SOB.  Pt does not have a pulse oximeter at home to test 02 levels.  I advised that SOB does not always mean low 02.   Pt has used rescue inhaler and rescue nebulizer which have helped.    Pt uses [facility] on [address].    CY please advise on further recs.  Thanks.   Last ov: 07/16/15 Next ov: 11/15/15  Allergies  Allergen Reactions  . Other Shortness Of Breath    *Perfumes*  . Penicillins     Has patient had a PCN reaction causing immediate rash, facial/tongue/throat swelling, SOB or lightheadedness with hypotension: No Has patient had a PCN reaction causing severe rash involving mucus membranes or skin necrosis: No Has patient had a PCN reaction that required hospitalization No Has patient had a PCN reaction occurring within the last 10 years: No If all of the above answers are "NO", then may proceed with Cephalosporin use.   . Pravastatin Sodium     REACTION: swelling in tounge and feet turn black  . Quinolones Other (See Comments)    hyper  . Moxifloxacin Anxiety   Current Outpatient Prescriptions on File Prior to Visit  Medication Sig Dispense Refill  . acetaminophen (TYLENOL) 500 MG tablet Take 500 mg by mouth every 6 (six) hours as needed for mild pain or moderate pain.    Marland Kitchen ALPRAZolam (XANAX) 0.25 MG tablet Take 1 tablet (0.25 mg total) by mouth 3 (three) times daily as needed for anxiety. 30 tablet 0  . aspirin 325 MG EC tablet Take 325 mg by mouth daily.      . Ginger, Zingiber officinalis, (GINGER ROOT) 500 MG CAPS Take 2 capsules by mouth daily.      Marland Kitchen lidocaine-prilocaine (EMLA) cream Apply 1 application topically as needed. 30 g 4  . Multiple Vitamin (MULTIVITAMIN WITH MINERALS) TABS tablet Take 1 tablet by mouth daily.    . Omega-3 Fatty Acids (FISH OIL) 1000 MG CAPS Take 1 capsule  by mouth daily.    . predniSONE (DELTASONE) 5 MG tablet 10 mg tomorrow and 5 mg for the next day 3 tablet 0  . PROAIR HFA 108 (90 BASE) MCG/ACT inhaler INHALE 2 PUFFS INTO THE LUNGS EVERY 4 (FOUR) HOURS AS NEEDED FOR WHEEZING OR SHORTNESS OF BREATH. 8.5 Inhaler 0  . prochlorperazine (COMPAZINE) 10 MG tablet Take 1 tablet (10 mg total) by mouth every 6 (six) hours as needed for nausea or vomiting. 30 tablet 0  . Tiotropium Bromide Monohydrate (SPIRIVA RESPIMAT) 2.5 MCG/ACT AERS Inhale 2 puffs into the lungs daily. 1 Inhaler 12   No current facility-administered medications on file prior to visit.

## 2015-08-08 NOTE — Telephone Encounter (Signed)
Sister Dare and Justice Rocher called reporting "She is having a hard time breathing.  Do we need to call Pulmonary?  She's wearing oxygen at 2L.  Used spiriva and pro-Air at 12:00 and Pro-air just now at 1:07 pm.   Temp = 97.5.  Was coughing a lot last night with white mucus but no coughing today.  Worse the last hour, sitting in recliner, gasping.  No nasal or chest congestion."  Advised she call pulmonologist and go to ED if in distress.  Can hear her talking, answering sisters questions freely in background.  Says "Easing some since used Pro-Air.  Sister will call Pulmonologist and if any new orders from our office call mobile [phone].

## 2015-08-10 ENCOUNTER — Ambulatory Visit: Payer: Medicare Other

## 2015-08-12 ENCOUNTER — Encounter (HOSPITAL_COMMUNITY)
Admission: RE | Admit: 2015-08-12 | Discharge: 2015-08-12 | Disposition: A | Discharge status: Home / Self Care | Payer: Medicare Other | Source: Ambulatory Visit | Attending: Internal Medicine | Admitting: Internal Medicine

## 2015-08-12 DIAGNOSIS — C801 Malignant (primary) neoplasm, unspecified: Secondary | ICD-10-CM | POA: Diagnosis present

## 2015-08-12 LAB — GLUCOSE, CAPILLARY: GLUCOSE-CAPILLARY: 87 mg/dL (ref 65–99)

## 2015-08-12 MED ORDER — FLUDEOXYGLUCOSE F - 18 (FDG) INJECTION
8.1000 | Freq: Once | INTRAVENOUS | Status: AC | PRN
  Administered 2015-08-12: 8.17 via INTRAVENOUS

## 2015-08-13 ENCOUNTER — Ambulatory Visit (HOSPITAL_BASED_OUTPATIENT_CLINIC_OR_DEPARTMENT_OTHER): Payer: Medicare Other | Admitting: Nurse Practitioner

## 2015-08-13 ENCOUNTER — Other Ambulatory Visit (HOSPITAL_BASED_OUTPATIENT_CLINIC_OR_DEPARTMENT_OTHER): Payer: Medicare Other

## 2015-08-13 ENCOUNTER — Ambulatory Visit (HOSPITAL_BASED_OUTPATIENT_CLINIC_OR_DEPARTMENT_OTHER): Payer: Medicare Other

## 2015-08-13 ENCOUNTER — Other Ambulatory Visit: Payer: Self-pay | Admitting: *Deleted

## 2015-08-13 VITALS — BP 109/48 | HR 98 | Temp 97.9°F | Resp 22 | Wt 161.5 lb

## 2015-08-13 DIAGNOSIS — C787 Secondary malignant neoplasm of liver and intrahepatic bile duct: Secondary | ICD-10-CM | POA: Diagnosis not present

## 2015-08-13 DIAGNOSIS — C7951 Secondary malignant neoplasm of bone: Secondary | ICD-10-CM

## 2015-08-13 DIAGNOSIS — C3491 Malignant neoplasm of unspecified part of right bronchus or lung: Secondary | ICD-10-CM | POA: Diagnosis not present

## 2015-08-13 DIAGNOSIS — Z5111 Encounter for antineoplastic chemotherapy: Secondary | ICD-10-CM

## 2015-08-13 DIAGNOSIS — C7970 Secondary malignant neoplasm of unspecified adrenal gland: Secondary | ICD-10-CM | POA: Diagnosis not present

## 2015-08-13 DIAGNOSIS — C799 Secondary malignant neoplasm of unspecified site: Secondary | ICD-10-CM

## 2015-08-13 DIAGNOSIS — J90 Pleural effusion, not elsewhere classified: Secondary | ICD-10-CM

## 2015-08-13 DIAGNOSIS — R7989 Other specified abnormal findings of blood chemistry: Secondary | ICD-10-CM

## 2015-08-13 DIAGNOSIS — C3492 Malignant neoplasm of unspecified part of left bronchus or lung: Secondary | ICD-10-CM

## 2015-08-13 LAB — COMPREHENSIVE METABOLIC PANEL
ALT: 189 U/L — ABNORMAL HIGH (ref 0–55)
AST: 226 U/L — AB (ref 5–34)
Albumin: 2.8 g/dL — ABNORMAL LOW (ref 3.5–5.0)
Alkaline Phosphatase: 394 U/L — ABNORMAL HIGH (ref 40–150)
Anion Gap: 10 mEq/L (ref 3–11)
BUN: 29.3 mg/dL — AB (ref 7.0–26.0)
CHLORIDE: 102 meq/L (ref 98–109)
CO2: 24 mEq/L (ref 22–29)
Calcium: 10.2 mg/dL (ref 8.4–10.4)
Creatinine: 1.1 mg/dL (ref 0.6–1.1)
EGFR: 46 mL/min/{1.73_m2} — ABNORMAL LOW (ref >90)
GLUCOSE: 103 mg/dL (ref 70–140)
POTASSIUM: 5.2 meq/L — AB (ref 3.5–5.1)
SODIUM: 137 meq/L (ref 136–145)
Total Bilirubin: 1.41 mg/dL — ABNORMAL HIGH (ref 0.20–1.20)
Total Protein: 6.4 g/dL (ref 6.4–8.3)

## 2015-08-13 LAB — CBC WITH DIFFERENTIAL/PLATELET
BASO%: 0.5 % (ref 0.0–2.0)
BASOS ABS: 0 10*3/uL (ref 0.0–0.1)
EOS%: 1 % (ref 0.0–7.0)
Eosinophils Absolute: 0.1 10*3/uL (ref 0.0–0.5)
HEMATOCRIT: 38.9 % (ref 34.8–46.6)
HGB: 12.7 g/dL (ref 11.6–15.9)
LYMPH%: 7.9 % — AB (ref 14.0–49.7)
MCH: 29.4 pg (ref 25.1–34.0)
MCHC: 32.6 g/dL (ref 31.5–36.0)
MCV: 90.1 fL (ref 79.5–101.0)
MONO#: 0.7 10*3/uL (ref 0.1–0.9)
MONO%: 7.5 % (ref 0.0–14.0)
NEUT#: 7.6 10*3/uL — ABNORMAL HIGH (ref 1.5–6.5)
NEUT%: 83.1 % — AB (ref 38.4–76.8)
PLATELETS: 276 10*3/uL (ref 145–400)
RBC: 4.32 10*6/uL (ref 3.70–5.45)
RDW: 15.9 % — ABNORMAL HIGH (ref 11.2–14.5)
WBC: 9.1 10*3/uL (ref 3.9–10.3)
lymph#: 0.7 10*3/uL — ABNORMAL LOW (ref 0.9–3.3)

## 2015-08-13 MED ORDER — PREDNISONE 10 MG PO TABS: 10.0000 mg | ORAL_TABLET | Freq: Every day | ORAL | Status: DC

## 2015-08-13 MED ORDER — SODIUM CHLORIDE 0.9 % IV SOLN
280.0000 mg | Freq: Once | INTRAVENOUS | Status: AC
  Administered 2015-08-13: 280 mg via INTRAVENOUS
  Filled 2015-08-13: qty 28

## 2015-08-13 MED ORDER — PALONOSETRON HCL INJECTION 0.25 MG/5ML
0.2500 mg | Freq: Once | INTRAVENOUS | Status: AC
  Administered 2015-08-13: 0.25 mg via INTRAVENOUS

## 2015-08-13 MED ORDER — PALONOSETRON HCL INJECTION 0.25 MG/5ML
INTRAVENOUS | Status: AC
  Filled 2015-08-13: qty 5

## 2015-08-13 MED ORDER — HEPARIN SOD (PORK) LOCK FLUSH 100 UNIT/ML IV SOLN
500.0000 [IU] | Freq: Once | INTRAVENOUS | Status: AC | PRN
  Administered 2015-08-13: 500 [IU]
  Filled 2015-08-13: qty 5

## 2015-08-13 MED ORDER — SODIUM CHLORIDE 0.9 % IV SOLN
Freq: Once | INTRAVENOUS | Status: AC
  Administered 2015-08-13: 15:00:00 via INTRAVENOUS

## 2015-08-13 MED ORDER — SODIUM CHLORIDE 0.9% FLUSH
10.0000 mL | INTRAVENOUS | Status: DC | PRN
  Administered 2015-08-13: 10 mL
  Filled 2015-08-13: qty 10

## 2015-08-13 MED ORDER — SODIUM CHLORIDE 0.9 % IV SOLN
10.0000 mg | Freq: Once | INTRAVENOUS | Status: AC
  Administered 2015-08-13: 10 mg via INTRAVENOUS
  Filled 2015-08-13: qty 1

## 2015-08-13 MED ORDER — PROCHLORPERAZINE MALEATE 10 MG PO TABS: 10.0000 mg | ORAL_TABLET | Freq: Four times a day (QID) | ORAL | Status: AC | PRN

## 2015-08-13 MED ORDER — SODIUM CHLORIDE 0.9 % IV SOLN
90.0000 mg/m2 | Freq: Once | INTRAVENOUS | Status: AC
  Administered 2015-08-13: 160 mg via INTRAVENOUS
  Filled 2015-08-13: qty 8

## 2015-08-13 MED ORDER — ALPRAZOLAM 0.25 MG PO TABS: 0.2500 mg | ORAL_TABLET | Freq: Three times a day (TID) | ORAL | Status: DC | PRN

## 2015-08-13 NOTE — Progress Notes (Signed)
[place] [phone]   [phone]  OFFICE PROGRESS NOTE  [name], MD [place] [address]  DIAGNOSIS: Extensive stage (T2a, N2, M1b) small cell lung cancer presented with right upper lobe lung mass in addition to mediastinal lymphadenopathy and liver metastases diagnosed in March 2017.  PRIOR THERAPY: None  CURRENT THERAPY: Systemic chemotherapy with dose reduced carboplatin for AUC of 4 on day 1 and etoposide 90 MG/M2 on days 1, 2 and 3 with Neulasta support on day 4. First cycle given 08/13/15  INTERVAL HISTORY: Meghan Castro 82 y.o. female returns to the clinic today for follow-up visit accompanied by several family members. The patient was initially supposed to start treatment last week, but her liver enzymes were elevated and the dose was held. In the meantime she has had a PET performed. She denies fevers, chills, nausea, or vomiting. She is moving her bowels well. Her appetite is low. She has lost 4lb this month. She is short of breath with minimal activity. She is on 2-3L O2 continuously with a mild cough. She denies sputum production, chest pain, or hemoptysis. She is on several inhalers for her COPD. She does not sleep well. She has had some night sweats. She is anxious and takes xanax PRN.   MEDICAL HISTORY: Past Medical History  Diagnosis Date  . Chronic airway obstruction, not elsewhere classified   . Unspecified sinusitis (chronic)   . Diaphragmatic hernia without mention of obstruction or gangrene   . Macular degeneration (senile) of retina, unspecified   . Other and unspecified hyperlipidemia   . Unspecified essential hypertension   . Unspecified disorder resulting from impaired renal function   . Mediastinal adenopathy   . Cancer [facility])     metastatic lung cancer    ALLERGIES:  is allergic to other; penicillins; pravastatin sodium; quinolones; and moxifloxacin.  MEDICATIONS:  Current  Outpatient Prescriptions  Medication Sig Dispense Refill  . aspirin 325 MG EC tablet Take 325 mg by mouth daily.      . Ginger, Zingiber officinalis, (GINGER ROOT) 500 MG CAPS Take 2 capsules by mouth daily. Reported on 08/13/2015    . losartan (COZAAR) 50 MG tablet Take 50 mg by mouth daily.    . Multiple Vitamin (MULTIVITAMIN WITH MINERALS) TABS tablet Take 1 tablet by mouth daily.    . Omega-3 Fatty Acids (FISH OIL) 1000 MG CAPS Take 1 capsule by mouth daily.    Marland Kitchen PROAIR HFA 108 (90 BASE) MCG/ACT inhaler INHALE 2 PUFFS INTO THE LUNGS EVERY 4 (FOUR) HOURS AS NEEDED FOR WHEEZING OR SHORTNESS OF BREATH. 8.5 Inhaler 0  . Tiotropium Bromide Monohydrate (SPIRIVA RESPIMAT) 2.5 MCG/ACT AERS Inhale 2 puffs into the lungs daily. 1 Inhaler 12  . acetaminophen (TYLENOL) 500 MG tablet Take 500 mg by mouth every 6 (six) hours as needed for mild pain or moderate pain. Reported on 08/13/2015    . ALPRAZolam (XANAX) 0.25 MG tablet Take 1 tablet (0.25 mg total) by mouth 3 (three) times daily as needed for anxiety. 30 tablet 0  . lidocaine-prilocaine (EMLA) cream Apply 1 application topically as needed. (Patient not taking: Reported on 08/13/2015) 30 g 4  . predniSONE (DELTASONE) 10 MG tablet Take 1 tablet (10 mg total) by mouth daily with breakfast. 30 tablet 0  . prochlorperazine (COMPAZINE) 10 MG tablet Take 1 tablet (10 mg total) by mouth every 6 (six) hours as needed for nausea or vomiting. 30 tablet 0  No current facility-administered medications for this visit.   Facility-Administered Medications Ordered in Other Visits  Medication Dose Route Frequency Provider Last Rate Last Dose  . etoposide (VEPESID) 160 mg in sodium chloride 0.9 % 500 mL chemo infusion  90 mg/m2 (Treatment Plan Actual) Intravenous Once Curt Bears, MD 508 mL/hr at 08/13/15 1640 160 mg at 08/13/15 1640  . heparin lock flush 100 unit/mL  500 Units Intracatheter Once PRN Curt Bears, MD      . sodium chloride flush (NS) 0.9 %  injection 10 mL  10 mL Intracatheter PRN Curt Bears, MD        SURGICAL HISTORY:  Past Surgical History  Procedure Laterality Date  . Arm fracture    . Cholecystectomy    . Total abdominal hysterectomy    . Bilateral salpingoophorectomy    . Incisional hernia repair      abdominal  . Appendectomy    . Cataract extraction    . Endobronchial ultrasound Bilateral 07/24/2015    Procedure: ENDOBRONCHIAL ULTRASOUND;  Surgeon: [name], MD;  Location: [facility];  Service: Cardiopulmonary;  Laterality: Bilateral;    REVIEW OF SYSTEMS:  Constitutional: positive for anorexia and fatigue Eyes: negative Ears, nose, mouth, throat, and face: negative Respiratory: positive for cough and dyspnea on exertion Cardiovascular: negative Gastrointestinal: negative Genitourinary:negative Integument/breast: negative Hematologic/lymphatic: negative Musculoskeletal:negative Neurological: negative Behavioral/Psych: negative Endocrine: negative Allergic/Immunologic: negative   PHYSICAL EXAMINATION: General appearance: alert, cooperative, fatigued and no distress Head: Normocephalic, without obvious abnormality, atraumatic Neck: no adenopathy, no JVD, supple, symmetrical, trachea midline and thyroid not enlarged, symmetric, no tenderness/mass/nodules Lymph nodes: Cervical, supraclavicular, and axillary nodes normal. Resp: wheezes bilaterally Back: symmetric, no curvature. ROM normal. No CVA tenderness. Cardio: regular rate and rhythm, S1, S2 normal, no murmur, click, rub or gallop and normal apical impulse GI: soft, non-tender; bowel sounds normal; no masses,  no organomegaly Extremities: extremities normal, atraumatic, no cyanosis or edema Neurologic: Alert and oriented X 3, normal strength and tone. Normal symmetric reflexes. Normal coordination and gait  ECOG PERFORMANCE STATUS: 1 - Symptomatic but completely ambulatory  Blood pressure 109/48, pulse 98, temperature 97.9 F (36.6  C), temperature source Oral, resp. rate 22, weight 161 lb 8 oz (73.256 kg), SpO2 96 %.  LABORATORY DATA: Lab Results  Component Value Date   WBC 9.1 08/13/2015   HGB 12.7 08/13/2015   HCT 38.9 08/13/2015   MCV 90.1 08/13/2015   PLT 276 08/13/2015      Chemistry      Component Value Date/Time   NA 137 08/13/2015 1255   NA 141 07/26/2015 0411   K 5.2* 08/13/2015 1255   K 4.4 07/26/2015 0411   CL 109 07/26/2015 0411   CO2 24 08/13/2015 1255   CO2 23 07/26/2015 0411   BUN 29.3* 08/13/2015 1255   BUN 34* 07/26/2015 0411   CREATININE 1.1 08/13/2015 1255   CREATININE 0.75 07/26/2015 0411      Component Value Date/Time   CALCIUM 10.2 08/13/2015 1255   CALCIUM 9.3 07/26/2015 0411   ALKPHOS 394* 08/13/2015 1255   ALKPHOS 75 06/14/2015 2205   AST 226* 08/13/2015 1255   AST 38 06/14/2015 2205   ALT 189* 08/13/2015 1255   ALT 26 06/14/2015 2205   BILITOT 1.41* 08/13/2015 1255   BILITOT 0.9 06/14/2015 2205       RADIOGRAPHIC STUDIES: Dg Chest 2 View  07/20/2015  CLINICAL DATA:  Shortness of breath. EXAM: CHEST  2 VIEW COMPARISON:  March 21, 2015 FINDINGS:  No pneumothorax. A large hiatal hernia is again identified. The heart, hila, and mediastinum are unchanged. There are new opacities in the lungs which have increased in the interval. The opacities are more focal in the right perihilar region and rounded/somewhat masslike in the right infrahilar region. These findings are new in the 4 month interval. The opacity is also mildly more prominent in the lateral left lung. The previous study suggested an underlying interstitial process such as fibrosis which persists. No other interval changes or acute abnormalities. IMPRESSION: 1. New bilateral focal pulmonary opacities. The most prominent opacity in the right infrahilar region is rounded and somewhat masslike. Given that these findings are new in the 4 month interval, they may represent an infectious or inflammatory process. A CT scan may  better evaluate. At the least, recommend treatment with a short-term follow-up to ensure resolution. Electronically Signed   By: Dorise Bullion III M.D   On: 07/20/2015 14:34   Mr Brain Wo Contrast  07/25/2015  CLINICAL DATA:  Small cell carcinoma.  Lung mass.  Staging. EXAM: MRI HEAD WITHOUT CONTRAST TECHNIQUE: Multiplanar, multiecho pulse sequences of the brain and surrounding structures were obtained without intravenous contrast. The patient refused administration of IV gadolinium, saying that she had been told not to receive contrast due to a previous history of unspecified renal disorder. COMPARISON:  None. FINDINGS: No evidence for acute infarction, hemorrhage, mass lesion, hydrocephalus, or extra-axial fluid. Generalized atrophy. Mild subcortical and periventricular T2 and FLAIR hyperintensities, likely chronic microvascular ischemic change. Pituitary, pineal, and cerebellar tonsils unremarkable. No upper cervical lesions. Flow voids are maintained throughout the carotid, basilar, and vertebral arteries. There are no areas of chronic hemorrhage. Visualized calvarium, skull base, and upper cervical osseous structures unremarkable. Scalp and extracranial soft tissues, orbits, sinuses, and mastoids show no acute process. BILATERAL cataract extraction. IMPRESSION: The patient refused contrast. Only noncontrast exam was performed. Sensitivity in the detection of metastatic disease is diminished. Atrophy and small vessel disease. No acute intracranial findings. No mass lesion identified. Electronically Signed   By: Staci Righter M.D.   On: 07/25/2015 20:32   Ct Chest High Resolution  07/20/2015  CLINICAL DATA:  Shortness of breath. Mediastinal adenopathy. COPD. Ex-smoker. EXAM: CT CHEST WITHOUT CONTRAST TECHNIQUE: Multidetector CT imaging of the chest was performed following the standard protocol without intravenous contrast. High resolution imaging of the lungs, as well as inspiratory and expiratory imaging,  was performed. COMPARISON:  Multiple chest radiographs including back to 07/28/2010. No prior CT. FINDINGS: Mediastinum/Lymph Nodes: Tortuous descending thoracic aorta. Normal heart size, without pericardial effusion. Right paratracheal adenopathy at 3.1 x 3.2 cm in image 21/series 3. Subcarinal node measures 2.7 cm on image 27/series 3. Prevascular adenopathy including at 1.3 cm. Complex hiatal hernia, containing the majority of the stomach. No complicating obstruction. Lungs/Pleura: Small right-sided pleural effusion. Right upper lobe pleural-based pulmonary nodule which measures 1.7 x 1.4 cm on image 20/series 10. Minimal motion degradation inferiorly. Right minor fissure thickening/ nodularity at 6 mm on image 25/series 10. Superior segment right lower lobe pulmonary nodule which measures 7 mm on image 22/series 10. Central right middle lobe lung mass with extension into the right upper lobe. This measures 4.7 by 4.6 cm on image 32/series 10 and coronal image 42. Mild left-sided pulmonary nodularity, including on the order of 2 mm in the left lower lobe on image 20/ series 10. High-resolution images demonstrate subpleural reticulation without basilar predominance. No honeycombing. No ground-glass opacity or micro nodularity. Expiratory images demonstrate no  significant air trapping. Upper abdomen: Heterogeneous density throughout the liver, most consistent with extensive metastatic disease. Example vague medial segment left liver lobe 4.0 x 3.9 cm lesion on image 50/series 3. Normal imaged portions of the spleen, adrenal glands, and pancreas. Musculoskeletal: Osteopenia. Possible heterogeneous density throughout the sternal manubrium on sagittal image 50. IMPRESSION: 1. Findings most consistent with stage IV primary bronchogenic carcinoma. Multiple lung nodules with a dominant right middle lobe lung mass. Thoracic adenopathy with innumerable liver lesions. 2. Small right pleural effusion. 3. Osteopenia.  Heterogeneous density in the sternal manubrium could relate to heterogeneous osteopenia or infiltrative osseous metastasis. 4. Interstitial lung disease which may represent nonspecific interstitial pneumonitis. 5. Complex large hiatal hernia, without complicating obstruction. These results will be called to the ordering clinician or representative by the Radiologist Assistant, and communication documented in the PACS or zVision Dashboard. Electronically Signed   By: Abigail Miyamoto M.D.   On: 07/20/2015 17:08   Nm Pet Image Initial (pi) Skull Base To Thigh  08/12/2015  CLINICAL DATA:  Initial treatment strategy for right small cell lung carcinoma. EXAM: NUCLEAR MEDICINE PET SKULL BASE TO THIGH TECHNIQUE: 8.2 mCi F-18 FDG was injected intravenously. Full-ring PET imaging was performed from the skull base to thigh after the radiotracer. CT data was obtained and used for attenuation correction and anatomic localization. FASTING BLOOD GLUCOSE:  Value: 87 mg/dl COMPARISON:  CT on 07/22/2012 FINDINGS: NECK 11 mm hypermetabolic left jugular level 2 lymph node is seen which measures 11 mm on image 22 of series 4, and has SUV max of 16.3. Right supraclavicular lymphadenopathy is seen measuring 2.0 cm on image 36/series 4 which has SUV max of 21.3. CHEST A dominant hypermetabolic mass is seen in the central right middle lobe which shows involvement of both the right hilum and mediastinum. This measures 5.0 x 5.2 cm on image 63/series 4 and has SUV max of 25.7. This is suspicious for a primary bronchogenic carcinoma. There is peripheral hypermetabolic opacity in the anterior right middle lobe most likely due to postobstructive pneumonitis. This obscures visualization of a previously seen 1.5 cm nodule at this location on previous CT. Bulky hypermetabolic mediastinal lymphadenopathy is seen in the prevascular, right paratracheal and subcarinal regions. Index area of lymphadenopathy in the right paratracheal region measures 3.4  x 4.8 cm on image 49/series 4 and has SUV max of 28.3. Mild hypermetabolic right hilar lymphadenopathy is also seen. A moderate to large right pleural effusion is seen with a focus of hypermetabolic activity in the right posterior pleural space, suggesting a malignant pleural effusion. ABDOMEN/PELVIS Diffusely increased hypermetabolic activity is seen throughout the liver corresponding with innumerable diffuse low-attenuation liver lesions, consistent with diffuse hepatic metastases. SUV max measured in the right hepatic lobe is 23.8. Small bilateral adrenal nodules are also seen showing hypermetabolic activity, consistent with bilateral adrenal metastases. No hypermetabolic lymphadenopathy identified within the abdomen or pelvis. A 3.3 cm infrarenal abdominal aortic aneurysm is noted. SKELETON Diffuse hypermetabolic bone metastases are seen involving the left mandible, sternum, spine, bilateral scapulae and ribs as well as the pelvis, consistent with diffuse bone metastases. IMPRESSION: Dominant 5 cm hypermetabolic mass in the central right middle lobe, which shows involvement of the right hilum and mediastinum. This is suspicious for site of primary bronchogenic carcinoma. Postobstructive pneumonitis in the anterior right middle lobe obscures a 1.5 cm right middle lobe nodule in this region which was better seen on previous CT. Metastatic lymphadenopathy throughout the mediastinum, right hilum, right supraclavicular region,  and left jugular level-II lymph node. Moderate to large right pleural effusion, likely malignant. Diffuse liver metastases and probable small bilateral adrenal metastases. Diffuse bone metastases. 3.3 cm infrarenal abdominal aortic aneurysm incidentally noted. Electronically Signed   By: Earle Gell M.D.   On: 08/12/2015 12:57   Ir Fluoro Guide Cv Line Left  08/05/2015  INDICATION: 82 year old female with a history of small cell lung carcinoma EXAM: IMPLANTED PORT A CATH PLACEMENT WITH  ULTRASOUND AND FLUOROSCOPIC GUIDANCE MEDICATIONS: 1 mg vancomycin; The antibiotic was administered within an appropriate time interval prior to skin puncture. ANESTHESIA/SEDATION: Moderate (conscious) sedation was employed during this procedure. A total of Versed 2.0 mg and Fentanyl 100 mcg was administered intravenously. Moderate Sedation Time: 53 minutes. The patient's level of consciousness and vital signs were monitored continuously by radiology nursing throughout the procedure under my direct supervision. FLUOROSCOPY TIME:  Five minutes, 0 seconds (546 mGy) COMPLICATIONS: None PROCEDURE: The procedure, risks, benefits, and alternatives were explained to the patient. Questions regarding the procedure were encouraged and answered. The patient understands and consents to the procedure. Ultrasound survey was performed with images stored and sent to PACs. The right neck and chest was prepped with chlorhexidine, and draped in the usual sterile fashion using maximum barrier technique (cap and mask, sterile gown, sterile gloves, large sterile sheet, Luck hygiene and cutaneous antiseptic). Antibiotic prophylaxis was provided with 1.0g vancomycin administered IV one hour prior to skin incision. Local anesthesia was attained by infiltration with 1% lidocaine without epinephrine. Ultrasound survey of the right neck demonstrated occlusion of the right internal jugular vein. The skin and subcutaneous tissues were infiltrated 1% lidocaine, and an attempt was made to access the base of the right external jugular vein. This was discovered to also be thrombosed. The left neck and chest were then prepped with chlorhexidine and draped in the usual sterile fashion using maximum barrier technique. Ultrasound demonstrated patency of the left internal jugular vein, and this was documented with an image. Under real-time ultrasound guidance, this vein was accessed with a 21 gauge micropuncture needle and image documentation was  performed. A small dermatotomy was made at the access site with an 11 scalpel. A 0.018" wire was advanced into the brachycephalic vein. The access needle exchanged for a 83F micropuncture vascular sheath. Given the course of the 018 wire into the right brachycephalic vein, a limited venogram was performed. An 015 wire was then advanced through the sheath into the brachycephalic vein a 5 French sheath was placed at the puncture left internal jugular vein. Angled multipurpose catheter and the 035 wire were then used in an attempt to direct the wire into the inferior vena cava. Given the distorted anatomy at the right hilum, the angle approach through the inferior cavoatrial junction was sub optimal. The length of the internal catheter was then estimated by withdrawing the 035 wire through the Kumpe the catheter, with the targeted anatomy at the distal brachycephalic vein, and horizontal course into the superior cavoatrial junction. The wire was removed and the Kumpe the catheter remain to the 5 [place] sheath. An appropriate location for the subcutaneous reservoir was selected below the clavicle and an incision was made through the skin and underlying soft tissues. The subcutaneous tissues were then dissected using a combination of blunt and sharp surgical technique and a pocket was formed. A single lumen power injectable portacatheter was then tunneled through the subcutaneous tissues from the pocket to the dermatotomy and the port reservoir placed within the subcutaneous pocket. The  venous access site was then serially dilated and a peel away vascular sheath placed over the 035 wire. The wire was removed and the port catheter advanced into position under fluoroscopic guidance. The catheter tip was positioned in the horizontal portion of the brachycephalic vein. This was documented with a spot image. The port catheter was then tested and found to flush and aspirate well. The port was flushed with saline followed by 100  units/mL heparinized saline. The pocket was then closed in two layers using first subdermal inverted interrupted absorbable sutures followed by a running subcuticular suture. The epidermis was then sealed with Dermabond. The dermatotomy at the venous access site was also seal with Dermabond. Patient tolerated the procedure well and remained hemodynamically stable throughout. 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 Patent left internal jugular vein. Distortion of the right hilar anatomy, with engorged left brachycephalic vein. Final image demonstrates port catheter in position with the tip of the catheter at the distal brachial cephalic vein. The tip is within the horizontal segment of the vein (AP dimension), before its confluence with the superior cavoatrial junction. IMPRESSION: Status post left IJ port catheter placement. Catheter ready for use. Signed, Dulcy Fanny. Earleen Newport, DO [facility] Electronically Signed   By: Corrie Mckusick D.O.   On: 08/05/2015 12:17   Ir US Guide Vasc Access Left  08/05/2015  INDICATION: 82 year old female with a history of small cell lung carcinoma EXAM: IMPLANTED PORT A CATH PLACEMENT WITH ULTRASOUND AND FLUOROSCOPIC GUIDANCE MEDICATIONS: 1 mg vancomycin; The antibiotic was administered within an appropriate time interval prior to skin puncture. ANESTHESIA/SEDATION: Moderate (conscious) sedation was employed during this procedure. A total of Versed 2.0 mg and Fentanyl 100 mcg was administered intravenously. Moderate Sedation Time: 53 minutes. The patient's level of consciousness and vital signs were monitored continuously by radiology nursing throughout the procedure under my direct supervision. FLUOROSCOPY TIME:  Five minutes, 0 seconds (353 mGy) COMPLICATIONS: None PROCEDURE: The procedure, risks, benefits, and alternatives  were explained to the patient. Questions regarding the procedure were encouraged and answered. The patient understands and consents to the procedure. Ultrasound survey was performed with images stored and sent to PACs. The right neck and chest was prepped with chlorhexidine, and draped in the usual sterile fashion using maximum barrier technique (cap and mask, sterile gown, sterile gloves, large sterile sheet, [place] hygiene and cutaneous antiseptic). Antibiotic prophylaxis was provided with 1.0g vancomycin administered IV one hour prior to skin incision. Local anesthesia was attained by infiltration with 1% lidocaine without epinephrine. Ultrasound survey of the right neck demonstrated occlusion of the right internal jugular vein. The skin and subcutaneous tissues were infiltrated 1% lidocaine, and an attempt was made to access the base of the right external jugular vein. This was discovered to also be thrombosed. The left neck and chest were then prepped with chlorhexidine and draped in the usual sterile fashion using maximum barrier technique. Ultrasound demonstrated patency of the left internal jugular vein, and this was documented with an image. Under real-time ultrasound guidance, this vein was accessed with a 21 gauge micropuncture needle and image documentation was performed. A small dermatotomy was made at the access site with an 11 scalpel. A 0.018" wire was advanced into the brachycephalic vein. The access needle exchanged for a 6F micropuncture vascular sheath. Given the course of the 018 wire into the right brachycephalic vein, a limited venogram was performed.  An 015 wire was then advanced through the sheath into the brachycephalic vein a 5 French sheath was placed at the puncture left internal jugular vein. Angled multipurpose catheter and the 035 wire were then used in an attempt to direct the wire into the inferior vena cava. Given the distorted anatomy at the right hilum, the angle approach through  the inferior cavoatrial junction was sub optimal. The length of the internal catheter was then estimated by withdrawing the 035 wire through the Kumpe the catheter, with the targeted anatomy at the distal brachycephalic vein, and horizontal course into the superior cavoatrial junction. The wire was removed and the Kumpe the catheter remain to the 5 [place] sheath. An appropriate location for the subcutaneous reservoir was selected below the clavicle and an incision was made through the skin and underlying soft tissues. The subcutaneous tissues were then dissected using a combination of blunt and sharp surgical technique and a pocket was formed. A single lumen power injectable portacatheter was then tunneled through the subcutaneous tissues from the pocket to the dermatotomy and the port reservoir placed within the subcutaneous pocket. The venous access site was then serially dilated and a peel away vascular sheath placed over the 035 wire. The wire was removed and the port catheter advanced into position under fluoroscopic guidance. The catheter tip was positioned in the horizontal portion of the brachycephalic vein. This was documented with a spot image. The port catheter was then tested and found to flush and aspirate well. The port was flushed with saline followed by 100 units/mL heparinized saline. The pocket was then closed in two layers using first subdermal inverted interrupted absorbable sutures followed by a running subcuticular suture. The epidermis was then sealed with Dermabond. The dermatotomy at the venous access site was also seal with Dermabond. Patient tolerated the procedure well and remained hemodynamically stable throughout. 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 Patent left internal jugular vein. Distortion of the right hilar anatomy, with engorged left brachycephalic vein. Final image  demonstrates port catheter in position with the tip of the catheter at the distal brachial cephalic vein. The tip is within the horizontal segment of the vein (AP dimension), before its confluence with the superior cavoatrial junction. IMPRESSION: Status post left IJ port catheter placement. Catheter ready for use. Signed, Dulcy Fanny. Earleen Newport, DO [facility] Electronically Signed   By: Corrie Mckusick D.O.   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 A lengthy discussion was had with the patient and her family about her current disease stage, prognosis and treatment options. The patient understands that she has incurable condition in order the treatment will be off palliative nature. We briefly reviewed the results of her PET scan. She understands that this is merely a baseline scan that will be of more use for comparison to future scans.  There are mets to the bone, lymph nodes, liver, and adrenal glands. She also has a moderate to large right pleural effusion. The labs were reviewed in detail and all LFTs are elevated. This was discussed with Dr. [name], and he suggests continuing with the start of treatment anyway.  The patient will proceed with treatment as follows: carboplatin AUC 4 on day 1 with etoposide '90mg'$ /m2 on days 1, 2, and 3.  Neulasta will be given on day 4.  According to Dr. [name] previous note, if the patient tolerates the first cycle well with no significant adverse effects, he increase her dose to carboplatin for AUC of 5 and etoposide 100 MG/M2 starting from cycle #2. She was advised to begin a protein supplement daily, while we await her visit with the nutritionist in April.  She was instructed to try melatonin '3mg'$  QHS for sleep or use the xanax she is already prescribed PRN. The patient will  have labs weekly. She is scheduled for a visit on 4/4 for reevaluation and management of any adverse effect of her treatment. The patient voices understanding of current disease status and treatment options and is in agreement with the current care plan. All questions were answered. The patient knows to call the clinic with any problems, questions or concerns. We can certainly see the patient much sooner if necessary.  I spent 20 minutes counseling the patient face to face. The total time spent in the appointment was 30 minutes.  Laurie Panda, NP 08/13/2015

## 2015-08-13 NOTE — Progress Notes (Signed)
Pt left accompanied by two of her sisters. Patient tolerated the chemo well. Discharge paperwork reviewed in depth with sister/caregiver. Patient and family verbalized understanding.

## 2015-08-13 NOTE — Patient Instructions (Addendum)
Etoposide, VP-16 injection  What is this medicine?  ETOPOSIDE, VP-16 (e toe POE side) is a chemotherapy drug. It is used to treat testicular cancer, lung cancer, and other cancers.  This medicine may be used for other purposes; ask your health care provider or pharmacist if you have questions.  What should I tell my health care provider before I take this medicine?  They need to know if you have any of these conditions:  -infection  -kidney disease  -low blood counts, like low white cell, platelet, or red cell counts  -an unusual or allergic reaction to etoposide, other chemotherapeutic agents, other medicines, foods, dyes, or preservatives  -pregnant or trying to get pregnant  -breast-feeding  How should I use this medicine?  This medicine is for infusion into a vein. It is administered in a hospital or clinic by a specially trained health care professional.  Talk to your pediatrician regarding the use of this medicine in children. Special care may be needed.  Overdosage: If you think you have taken too much of this medicine contact a poison control center or emergency room at once.  NOTE: This medicine is only for you. Do not share this medicine with others.  What if I miss a dose?  It is important not to miss your dose. Call your doctor or health care professional if you are unable to keep an appointment.  What may interact with this medicine?  -aspirin  -certain medications for seizures like carbamazepine, phenobarbital, phenytoin, valproic acid  -cyclosporine  -levamisole  -warfarin  This list may not describe all possible interactions. Give your health care provider a list of all the medicines, herbs, non-prescription drugs, or dietary supplements you use. Also tell them if you smoke, drink alcohol, or use illegal drugs. Some items may interact with your medicine.  What should I watch for while using this medicine?  Visit your doctor for checks on your progress. This drug may make you feel generally unwell.  This is not uncommon, as chemotherapy can affect healthy cells as well as cancer cells. Report any side effects. Continue your course of treatment even though you feel ill unless your doctor tells you to stop.  In some cases, you may be given additional medicines to help with side effects. Follow all directions for their use.  Call your doctor or health care professional for advice if you get a fever, chills or sore throat, or other symptoms of a cold or flu. Do not treat yourself. This drug decreases your body's ability to fight infections. Try to avoid being around people who are sick.  This medicine may increase your risk to bruise or bleed. Call your doctor or health care professional if you notice any unusual bleeding.  Be careful brushing and flossing your teeth or using a toothpick because you may get an infection or bleed more easily. If you have any dental work done, tell your dentist you are receiving this medicine.  Avoid taking products that contain aspirin, acetaminophen, ibuprofen, naproxen, or ketoprofen unless instructed by your doctor. These medicines may hide a fever.  Do not become pregnant while taking this medicine or for at least 6 months after stopping it. Women should inform their doctor if they wish to become pregnant or think they might be pregnant. Women of child-bearing potential will need to have a negative pregnancy test before starting this medicine. There is a potential for serious side effects to an unborn child. Talk to your health care professional or   pharmacist for more information. Do not breast-feed an infant while taking this medicine.  Men must use a latex condom during sexual contact with a woman while taking this medicine and for at least 4 months after stopping it. A latex condom is needed even if you have had a vasectomy. Contact your doctor right away if your partner becomes pregnant. Do not donate sperm while taking this medicine and for at least 4 months after you stop  taking this medicine. Men should inform their doctors if they wish to father a child. This medicine may lower sperm counts.  What side effects may I notice from receiving this medicine?  Side effects that you should report to your doctor or health care professional as soon as possible:  -allergic reactions like skin rash, itching or hives, swelling of the face, lips, or tongue  -low blood counts - this medicine may decrease the number of white blood cells, red blood cells and platelets. You may be at increased risk for infections and bleeding.  -signs of infection - fever or chills, cough, sore throat, pain or difficulty passing urine  -signs of decreased platelets or bleeding - bruising, pinpoint red spots on the skin, black, tarry stools, blood in the urine  -signs of decreased red blood cells - unusually weak or tired, fainting spells, lightheadedness  -breathing problems  -changes in vision  -mouth or throat sores or ulcers  -pain, redness, swelling or irritation at the injection site  -pain, tingling, numbness in the hands or feet  -redness, blistering, peeling or loosening of the skin, including inside the mouth  -seizures  -vomiting  Side effects that usually do not require medical attention (report to your doctor or health care professional if they continue or are bothersome):  -diarrhea  -hair loss  -loss of appetite  -nausea  -stomach pain  This list may not describe all possible side effects. Call your doctor for medical advice about side effects. You may report side effects to FDA at 1-800-FDA-1088.  Where should I keep my medicine?  This drug is given in a hospital or clinic and will not be stored at home.  NOTE: This sheet is a summary. It may not cover all possible information. If you have questions about this medicine, talk to your doctor, pharmacist, or health care provider.      2016, Elsevier/Gold Standard. (2013-12-28 12:32:50)  Carboplatin injection  What is this medicine?  CARBOPLATIN (KAR boe  pla tin) is a chemotherapy drug. It targets fast dividing cells, like cancer cells, and causes these cells to die. This medicine is used to treat ovarian cancer and many other cancers.  This medicine may be used for other purposes; ask your health care provider or pharmacist if you have questions.  What should I tell my health care provider before I take this medicine?  They need to know if you have any of these conditions:  -blood disorders  -hearing problems  -kidney disease  -recent or ongoing radiation therapy  -an unusual or allergic reaction to carboplatin, cisplatin, other chemotherapy, other medicines, foods, dyes, or preservatives  -pregnant or trying to get pregnant  -breast-feeding  How should I use this medicine?  This drug is usually given as an infusion into a vein. It is administered in a hospital or clinic by a specially trained health care professional.  Talk to your pediatrician regarding the use of this medicine in children. Special care may be needed.  Overdosage: If you think you have taken   too much of this medicine contact a poison control center or emergency room at once.  NOTE: This medicine is only for you. Do not share this medicine with others.  What if I miss a dose?  It is important not to miss a dose. Call your doctor or health care professional if you are unable to keep an appointment.  What may interact with this medicine?  -medicines for seizures  -medicines to increase blood counts like filgrastim, pegfilgrastim, sargramostim  -some antibiotics like amikacin, gentamicin, neomycin, streptomycin, tobramycin  -vaccines  Talk to your doctor or health care professional before taking any of these medicines:  -acetaminophen  -aspirin  -ibuprofen  -ketoprofen  -naproxen  This list may not describe all possible interactions. Give your health care provider a list of all the medicines, herbs, non-prescription drugs, or dietary supplements you use. Also tell them if you smoke, drink alcohol, or  use illegal drugs. Some items may interact with your medicine.  What should I watch for while using this medicine?  Your condition will be monitored carefully while you are receiving this medicine. You will need important blood work done while you are taking this medicine.  This drug may make you feel generally unwell. This is not uncommon, as chemotherapy can affect healthy cells as well as cancer cells. Report any side effects. Continue your course of treatment even though you feel ill unless your doctor tells you to stop.  In some cases, you may be given additional medicines to help with side effects. Follow all directions for their use.  Call your doctor or health care professional for advice if you get a fever, chills or sore throat, or other symptoms of a cold or flu. Do not treat yourself. This drug decreases your body's ability to fight infections. Try to avoid being around people who are sick.  This medicine may increase your risk to bruise or bleed. Call your doctor or health care professional if you notice any unusual bleeding.  Be careful brushing and flossing your teeth or using a toothpick because you may get an infection or bleed more easily. If you have any dental work done, tell your dentist you are receiving this medicine.  Avoid taking products that contain aspirin, acetaminophen, ibuprofen, naproxen, or ketoprofen unless instructed by your doctor. These medicines may hide a fever.  Do not become pregnant while taking this medicine. Women should inform their doctor if they wish to become pregnant or think they might be pregnant. There is a potential for serious side effects to an unborn child. Talk to your health care professional or pharmacist for more information. Do not breast-feed an infant while taking this medicine.  What side effects may I notice from receiving this medicine?  Side effects that you should report to your doctor or health care professional as soon as possible:  -allergic  reactions like skin rash, itching or hives, swelling of the face, lips, or tongue  -signs of infection - fever or chills, cough, sore throat, pain or difficulty passing urine  -signs of decreased platelets or bleeding - bruising, pinpoint red spots on the skin, black, tarry stools, nosebleeds  -signs of decreased red blood cells - unusually weak or tired, fainting spells, lightheadedness  -breathing problems  -changes in hearing  -changes in vision  -chest pain  -high blood pressure  -low blood counts - This drug may decrease the number of white blood cells, red blood cells and platelets. You may be at increased risk for   hospital or clinic and will not be stored at home. NOTE: This sheet is a summary. It may not cover all possible information. If you have questions about this medicine, talk to your doctor, pharmacist, or health care provider.    2016, Elsevier/Gold Standard. (2007-08-09 14:38:05) [facility] Discharge Instructions for Patients Receiving Chemotherapy  Today you received the following chemotherapy agents:  Carboplatin & Etoposide (VP-16)  To help prevent nausea and vomiting after your treatment, we encourage you to take your nausea medication.   If you develop nausea and vomiting  that is not controlled by your nausea medication, call the clinic.   BELOW ARE SYMPTOMS THAT SHOULD BE REPORTED IMMEDIATELY:  *FEVER GREATER THAN 100.5 F  *CHILLS WITH OR WITHOUT FEVER  NAUSEA AND VOMITING THAT IS NOT CONTROLLED WITH YOUR NAUSEA MEDICATION  *UNUSUAL SHORTNESS OF BREATH  *UNUSUAL BRUISING OR BLEEDING  TENDERNESS IN MOUTH AND THROAT WITH OR WITHOUT PRESENCE OF ULCERS  *URINARY PROBLEMS  *BOWEL PROBLEMS  UNUSUAL RASH Items with * indicate a potential emergency and should be followed up as soon as possible.  Feel free to call the clinic you have any questions or concerns. The clinic phone number is [phone].  Please show the [place] at check-in to the Emergency Department and triage nurse.

## 2015-08-14 ENCOUNTER — Ambulatory Visit (HOSPITAL_BASED_OUTPATIENT_CLINIC_OR_DEPARTMENT_OTHER): Payer: Medicare Other

## 2015-08-14 VITALS — BP 108/49 | HR 79 | Temp 97.9°F | Resp 32

## 2015-08-14 DIAGNOSIS — C787 Secondary malignant neoplasm of liver and intrahepatic bile duct: Secondary | ICD-10-CM | POA: Diagnosis not present

## 2015-08-14 DIAGNOSIS — Z5111 Encounter for antineoplastic chemotherapy: Secondary | ICD-10-CM | POA: Diagnosis not present

## 2015-08-14 DIAGNOSIS — C3491 Malignant neoplasm of unspecified part of right bronchus or lung: Secondary | ICD-10-CM

## 2015-08-14 MED ORDER — HEPARIN SOD (PORK) LOCK FLUSH 100 UNIT/ML IV SOLN
500.0000 [IU] | Freq: Once | INTRAVENOUS | Status: AC | PRN
  Administered 2015-08-14: 500 [IU]
  Filled 2015-08-14: qty 5

## 2015-08-14 MED ORDER — SODIUM CHLORIDE 0.9 % IV SOLN
Freq: Once | INTRAVENOUS | Status: AC
  Administered 2015-08-14: 16:00:00 via INTRAVENOUS

## 2015-08-14 MED ORDER — DEXAMETHASONE SODIUM PHOSPHATE 100 MG/10ML IJ SOLN
10.0000 mg | Freq: Once | INTRAMUSCULAR | Status: AC
  Administered 2015-08-14: 10 mg via INTRAVENOUS
  Filled 2015-08-14: qty 1

## 2015-08-14 MED ORDER — SODIUM CHLORIDE 0.9 % IV SOLN
90.0000 mg/m2 | Freq: Once | INTRAVENOUS | Status: AC
  Administered 2015-08-14: 160 mg via INTRAVENOUS
  Filled 2015-08-14: qty 8

## 2015-08-14 MED ORDER — SODIUM CHLORIDE 0.9% FLUSH
10.0000 mL | INTRAVENOUS | Status: DC | PRN
  Administered 2015-08-14: 10 mL
  Filled 2015-08-14: qty 10

## 2015-08-14 NOTE — Patient Instructions (Signed)
[place] Discharge Instructions for Patients Receiving Chemotherapy  Today you received the following chemotherapy agents:  VP-16 (etoposide)  To help prevent nausea and vomiting after your treatment, we encourage you to take your nausea medication.   If you develop nausea and vomiting that is not controlled by your nausea medication, call the clinic.   BELOW ARE SYMPTOMS THAT SHOULD BE REPORTED IMMEDIATELY:  *FEVER GREATER THAN 100.5 F  *CHILLS WITH OR WITHOUT FEVER  NAUSEA AND VOMITING THAT IS NOT CONTROLLED WITH YOUR NAUSEA MEDICATION  *UNUSUAL SHORTNESS OF BREATH  *UNUSUAL BRUISING OR BLEEDING  TENDERNESS IN MOUTH AND THROAT WITH OR WITHOUT PRESENCE OF ULCERS  *URINARY PROBLEMS  *BOWEL PROBLEMS  UNUSUAL RASH Items with * indicate a potential emergency and should be followed up as soon as possible.  Feel free to call the clinic you have any questions or concerns. The clinic phone number is [phone].  Please show the [place] at check-in to the Emergency Department and triage nurse.

## 2015-08-15 ENCOUNTER — Encounter: Payer: Self-pay | Admitting: Nurse Practitioner

## 2015-08-15 ENCOUNTER — Ambulatory Visit (HOSPITAL_BASED_OUTPATIENT_CLINIC_OR_DEPARTMENT_OTHER): Payer: Medicare Other

## 2015-08-15 VITALS — BP 110/42 | HR 75 | Temp 97.2°F | Resp 26

## 2015-08-15 DIAGNOSIS — C3491 Malignant neoplasm of unspecified part of right bronchus or lung: Secondary | ICD-10-CM

## 2015-08-15 DIAGNOSIS — C787 Secondary malignant neoplasm of liver and intrahepatic bile duct: Secondary | ICD-10-CM

## 2015-08-15 DIAGNOSIS — Z5111 Encounter for antineoplastic chemotherapy: Secondary | ICD-10-CM | POA: Diagnosis not present

## 2015-08-15 MED ORDER — SODIUM CHLORIDE 0.9 % IV SOLN
10.0000 mg | Freq: Once | INTRAVENOUS | Status: AC
  Administered 2015-08-15: 10 mg via INTRAVENOUS
  Filled 2015-08-15: qty 1

## 2015-08-15 MED ORDER — SODIUM CHLORIDE 0.9% FLUSH
10.0000 mL | INTRAVENOUS | Status: DC | PRN
  Administered 2015-08-15: 10 mL
  Filled 2015-08-15: qty 10

## 2015-08-15 MED ORDER — SODIUM CHLORIDE 0.9 % IV SOLN
90.0000 mg/m2 | Freq: Once | INTRAVENOUS | Status: AC
  Administered 2015-08-15: 160 mg via INTRAVENOUS
  Filled 2015-08-15: qty 8

## 2015-08-15 MED ORDER — SODIUM CHLORIDE 0.9 % IV SOLN
Freq: Once | INTRAVENOUS | Status: AC
  Administered 2015-08-15: 15:00:00 via INTRAVENOUS

## 2015-08-15 MED ORDER — HEPARIN SOD (PORK) LOCK FLUSH 100 UNIT/ML IV SOLN
500.0000 [IU] | Freq: Once | INTRAVENOUS | Status: AC | PRN
  Administered 2015-08-15: 500 [IU]
  Filled 2015-08-15: qty 5

## 2015-08-15 NOTE — Patient Instructions (Signed)
[facility] Discharge Instructions for Patients Receiving Chemotherapy  Today you received the following chemotherapy agents: Etoposide   To help prevent nausea and vomiting after your treatment, we encourage you to take your nausea medication as directed.    If you develop nausea and vomiting that is not controlled by your nausea medication, call the clinic.   BELOW ARE SYMPTOMS THAT SHOULD BE REPORTED IMMEDIATELY:  *FEVER GREATER THAN 100.5 F  *CHILLS WITH OR WITHOUT FEVER  NAUSEA AND VOMITING THAT IS NOT CONTROLLED WITH YOUR NAUSEA MEDICATION  *UNUSUAL SHORTNESS OF BREATH  *UNUSUAL BRUISING OR BLEEDING  TENDERNESS IN MOUTH AND THROAT WITH OR WITHOUT PRESENCE OF ULCERS  *URINARY PROBLEMS  *BOWEL PROBLEMS  UNUSUAL RASH Items with * indicate a potential emergency and should be followed up as soon as possible.  Feel free to call the clinic you have any questions or concerns. The clinic phone number is [phone].  Please show the CHEMO ALERT CARD at check-in to the Emergency Department and triage nurse.   

## 2015-08-16 ENCOUNTER — Ambulatory Visit: Payer: Medicare Other

## 2015-08-17 ENCOUNTER — Ambulatory Visit (HOSPITAL_BASED_OUTPATIENT_CLINIC_OR_DEPARTMENT_OTHER): Payer: Medicare Other

## 2015-08-17 VITALS — BP 130/69 | HR 70 | Temp 97.9°F | Resp 20

## 2015-08-17 DIAGNOSIS — Z5189 Encounter for other specified aftercare: Secondary | ICD-10-CM

## 2015-08-17 DIAGNOSIS — C3491 Malignant neoplasm of unspecified part of right bronchus or lung: Secondary | ICD-10-CM | POA: Diagnosis not present

## 2015-08-17 DIAGNOSIS — C787 Secondary malignant neoplasm of liver and intrahepatic bile duct: Secondary | ICD-10-CM | POA: Diagnosis not present

## 2015-08-17 MED ORDER — PEGFILGRASTIM INJECTION 6 MG/0.6ML ~~LOC~~
6.0000 mg | PREFILLED_SYRINGE | Freq: Once | SUBCUTANEOUS | Status: AC
  Administered 2015-08-17: 6 mg via SUBCUTANEOUS

## 2015-08-20 ENCOUNTER — Other Ambulatory Visit (HOSPITAL_BASED_OUTPATIENT_CLINIC_OR_DEPARTMENT_OTHER): Payer: Medicare Other

## 2015-08-20 ENCOUNTER — Ambulatory Visit (HOSPITAL_BASED_OUTPATIENT_CLINIC_OR_DEPARTMENT_OTHER): Payer: Medicare Other | Admitting: Nurse Practitioner

## 2015-08-20 ENCOUNTER — Encounter: Payer: Self-pay | Admitting: Nurse Practitioner

## 2015-08-20 ENCOUNTER — Telehealth: Payer: Self-pay | Admitting: Nurse Practitioner

## 2015-08-20 VITALS — BP 125/52 | HR 68 | Temp 97.9°F | Resp 18 | Ht 62.0 in | Wt 161.8 lb

## 2015-08-20 DIAGNOSIS — C7951 Secondary malignant neoplasm of bone: Secondary | ICD-10-CM

## 2015-08-20 DIAGNOSIS — C3491 Malignant neoplasm of unspecified part of right bronchus or lung: Secondary | ICD-10-CM

## 2015-08-20 DIAGNOSIS — C787 Secondary malignant neoplasm of liver and intrahepatic bile duct: Secondary | ICD-10-CM

## 2015-08-20 DIAGNOSIS — C3492 Malignant neoplasm of unspecified part of left bronchus or lung: Secondary | ICD-10-CM

## 2015-08-20 DIAGNOSIS — C7970 Secondary malignant neoplasm of unspecified adrenal gland: Secondary | ICD-10-CM

## 2015-08-20 DIAGNOSIS — J90 Pleural effusion, not elsewhere classified: Secondary | ICD-10-CM

## 2015-08-20 DIAGNOSIS — C779 Secondary and unspecified malignant neoplasm of lymph node, unspecified: Secondary | ICD-10-CM

## 2015-08-20 DIAGNOSIS — E875 Hyperkalemia: Secondary | ICD-10-CM

## 2015-08-20 LAB — CBC WITH DIFFERENTIAL/PLATELET
BASO%: 0.2 % (ref 0.0–2.0)
BASOS ABS: 0 10*3/uL (ref 0.0–0.1)
EOS%: 0.9 % (ref 0.0–7.0)
Eosinophils Absolute: 0 10*3/uL (ref 0.0–0.5)
HCT: 34 % — ABNORMAL LOW (ref 34.8–46.6)
HGB: 11 g/dL — ABNORMAL LOW (ref 11.6–15.9)
LYMPH%: 15.1 % (ref 14.0–49.7)
MCH: 29.4 pg (ref 25.1–34.0)
MCHC: 32.2 g/dL (ref 31.5–36.0)
MCV: 91.3 fL (ref 79.5–101.0)
MONO#: 0 10*3/uL — ABNORMAL LOW (ref 0.1–0.9)
MONO%: 1.5 % (ref 0.0–14.0)
NEUT#: 1.9 10*3/uL (ref 1.5–6.5)
NEUT%: 82.3 % — AB (ref 38.4–76.8)
Platelets: 133 10*3/uL — ABNORMAL LOW (ref 145–400)
RBC: 3.73 10*6/uL (ref 3.70–5.45)
RDW: 15.6 % — ABNORMAL HIGH (ref 11.2–14.5)
WBC: 2.3 10*3/uL — ABNORMAL LOW (ref 3.9–10.3)
lymph#: 0.3 10*3/uL — ABNORMAL LOW (ref 0.9–3.3)

## 2015-08-20 LAB — COMPREHENSIVE METABOLIC PANEL
ALT: 87 U/L — ABNORMAL HIGH (ref 0–55)
AST: 94 U/L — AB (ref 5–34)
Albumin: 2.7 g/dL — ABNORMAL LOW (ref 3.5–5.0)
Alkaline Phosphatase: 287 U/L — ABNORMAL HIGH (ref 40–150)
Anion Gap: 6 mEq/L (ref 3–11)
BUN: 30.3 mg/dL — AB (ref 7.0–26.0)
CALCIUM: 9.3 mg/dL (ref 8.4–10.4)
CHLORIDE: 109 meq/L (ref 98–109)
CO2: 27 mEq/L (ref 22–29)
Creatinine: 0.8 mg/dL (ref 0.6–1.1)
EGFR: 70 mL/min/{1.73_m2} — AB (ref >90)
Glucose: 121 mg/dl (ref 70–140)
Sodium: 142 mEq/L (ref 136–145)
Total Bilirubin: 0.77 mg/dL (ref 0.20–1.20)
Total Protein: 6 g/dL — ABNORMAL LOW (ref 6.4–8.3)

## 2015-08-20 NOTE — Progress Notes (Signed)
[place] [phone]   [phone]  OFFICE PROGRESS NOTE  [name], MD [place] [address]  DIAGNOSIS: Extensive stage (T2a, N2, M1b) small cell lung cancer presented with right upper lobe lung mass in addition to mediastinal lymphadenopathy and liver metastases diagnosed in March 2017.  PRIOR THERAPY: None  CURRENT THERAPY: Systemic chemotherapy with dose reduced carboplatin for AUC of 4 on day 1 and etoposide 90 MG/M2 on days 1, 2 and 3 with Neulasta support on day 4. First cycle given 08/13/15  INTERVAL HISTORY: Meghan Castro 82 y.o. female returns to the clinic today for follow-up visit accompanied by several family members. Today is cycle 1, day 8 of dose reduced carboplatin and etoposide. Overall, Kc performed better than she thought she would. She had no nausea. She worked with physical therapy last week and was able to ambulate down the hall with remarkable endurance. She is also very sufficent in the shower, which has been an issues previously. Her bowels are moving a little slowly, but she is not eating much. She has started supplementing with Muscle Milk daily. She has not lost any more weight. She continues on 2-3L O2 continuously. She has a mild cough and is short of breath with activity. She denies sputum production, chest pain, or hemoptysis. She is on several inhalers for her COPD. Sleep is still hard to come by at night, but she has seen some mild improvements here as well.  MEDICAL HISTORY: Past Medical History  Diagnosis Date  . Chronic airway obstruction, not elsewhere classified   . Unspecified sinusitis (chronic)   . Diaphragmatic hernia without mention of obstruction or gangrene   . Macular degeneration (senile) of retina, unspecified   . Other and unspecified hyperlipidemia   . Unspecified essential hypertension   . Unspecified disorder resulting from impaired renal function   . Mediastinal  adenopathy   . [facility])     metastatic lung cancer    ALLERGIES:  is allergic to other; penicillins; pravastatin sodium; quinolones; and moxifloxacin.  MEDICATIONS:  Current Outpatient Prescriptions  Medication Sig Dispense Refill  . acetaminophen (TYLENOL) 500 MG tablet Take 500 mg by mouth every 6 (six) hours as needed for mild pain or moderate pain. Reported on 08/13/2015    . ALPRAZolam (XANAX) 0.25 MG tablet Take 1 tablet (0.25 mg total) by mouth 3 (three) times daily as needed for anxiety. 30 tablet 0  . aspirin 325 MG EC tablet Take 325 mg by mouth daily.      . Ginger, Zingiber officinalis, (GINGER ROOT) 500 MG CAPS Take 2 capsules by mouth daily. Reported on 08/13/2015    . lidocaine-prilocaine (EMLA) cream Apply 1 application topically as needed. 30 g 4  . losartan (COZAAR) 50 MG tablet Take 50 mg by mouth daily.    . Multiple Vitamin (MULTIVITAMIN WITH MINERALS) TABS tablet Take 1 tablet by mouth daily.    . Omega-3 Fatty Acids (FISH OIL) 1000 MG CAPS Take 1 capsule by mouth daily.    . predniSONE (DELTASONE) 10 MG tablet Take 1 tablet (10 mg total) by mouth daily with breakfast. 30 tablet 0  . PROAIR HFA 108 (90 BASE) MCG/ACT inhaler INHALE 2 PUFFS INTO THE LUNGS EVERY 4 (FOUR) HOURS AS NEEDED FOR WHEEZING OR SHORTNESS OF BREATH. 8.5 Inhaler 0  . prochlorperazine (COMPAZINE) 10 MG tablet Take 1 tablet (10 mg total) by mouth every 6 (six) hours as needed for nausea or  vomiting. 30 tablet 0  . Tiotropium Bromide Monohydrate (SPIRIVA RESPIMAT) 2.5 MCG/ACT AERS Inhale 2 puffs into the lungs daily. 1 Inhaler 12   No current facility-administered medications for this visit.    SURGICAL HISTORY:  Past Surgical History  Procedure Laterality Date  . Arm fracture    . Cholecystectomy    . Total abdominal hysterectomy    . Bilateral salpingoophorectomy    . Incisional hernia repair      abdominal  . Appendectomy    . Cataract extraction    . Endobronchial ultrasound Bilateral  07/24/2015    Procedure: ENDOBRONCHIAL ULTRASOUND;  Surgeon: [name], MD;  Location: [facility];  Service: Cardiopulmonary;  Laterality: Bilateral;    REVIEW OF SYSTEMS:  Constitutional: positive for anorexia and fatigue Eyes: negative Ears, nose, mouth, throat, and face: negative Respiratory: positive for cough and dyspnea on exertion Cardiovascular: negative Gastrointestinal: negative Genitourinary:negative Integument/breast: negative Hematologic/lymphatic: negative Musculoskeletal:negative Neurological: negative Behavioral/Psych: negative Endocrine: negative Allergic/Immunologic: negative   PHYSICAL EXAMINATION: General appearance: alert, cooperative, fatigued and no distress Head: Normocephalic, without obvious abnormality, atraumatic Neck: no adenopathy, no JVD, supple, symmetrical, trachea midline and thyroid not enlarged, symmetric, no tenderness/mass/nodules Lymph nodes: Cervical, supraclavicular, and axillary nodes normal. Resp: wheezes bilaterally Back: symmetric, no curvature. ROM normal. No CVA tenderness. Cardio: regular rate and rhythm, S1, S2 normal, no murmur, click, rub or gallop and normal apical impulse GI: soft, non-tender; bowel sounds normal; no masses,  no organomegaly Extremities: extremities normal, atraumatic, no cyanosis or edema Neurologic: Alert and oriented X 3, normal strength and tone. Normal symmetric reflexes. Normal coordination and gait  ECOG PERFORMANCE STATUS: 1 - Symptomatic but completely ambulatory  Blood pressure 125/52, pulse 68, temperature 97.9 F (36.6 C), temperature source Oral, resp. rate 18, height '5\' 2"'$  (1.575 m), weight 161 lb 12.8 oz (73.392 kg), SpO2 98 %.  LABORATORY DATA: Lab Results  Component Value Date   WBC 2.3* 08/20/2015   HGB 11.0* 08/20/2015   HCT 34.0* 08/20/2015   MCV 91.3 08/20/2015   PLT 133* 08/20/2015      Chemistry      Component Value Date/Time   NA 137 08/13/2015 1255   NA 141  07/26/2015 0411   K 5.2* 08/13/2015 1255   K 4.4 07/26/2015 0411   CL 109 07/26/2015 0411   CO2 24 08/13/2015 1255   CO2 23 07/26/2015 0411   BUN 29.3* 08/13/2015 1255   BUN 34* 07/26/2015 0411   CREATININE 1.1 08/13/2015 1255   CREATININE 0.75 07/26/2015 0411      Component Value Date/Time   CALCIUM 10.2 08/13/2015 1255   CALCIUM 9.3 07/26/2015 0411   ALKPHOS 394* 08/13/2015 1255   ALKPHOS 75 06/14/2015 2205   AST 226* 08/13/2015 1255   AST 38 06/14/2015 2205   ALT 189* 08/13/2015 1255   ALT 26 06/14/2015 2205   BILITOT 1.41* 08/13/2015 1255   BILITOT 0.9 06/14/2015 2205       RADIOGRAPHIC STUDIES: Mr Brain Wo Contrast  07/25/2015  CLINICAL DATA:  Small cell carcinoma.  Lung mass.  Staging. EXAM: MRI HEAD WITHOUT CONTRAST TECHNIQUE: Multiplanar, multiecho pulse sequences of the brain and surrounding structures were obtained without intravenous contrast. The patient refused administration of IV gadolinium, saying that she had been told not to receive contrast due to a previous history of unspecified renal disorder. COMPARISON:  None. FINDINGS: No evidence for acute infarction, hemorrhage, mass lesion, hydrocephalus, or extra-axial fluid. Generalized atrophy. Mild subcortical and periventricular T2 and FLAIR  hyperintensities, likely chronic microvascular ischemic change. Pituitary, pineal, and cerebellar tonsils unremarkable. No upper cervical lesions. Flow voids are maintained throughout the carotid, basilar, and vertebral arteries. There are no areas of chronic hemorrhage. Visualized calvarium, skull base, and upper cervical osseous structures unremarkable. Scalp and extracranial soft tissues, orbits, sinuses, and mastoids show no acute process. BILATERAL cataract extraction. IMPRESSION: The patient refused contrast. Only noncontrast exam was performed. Sensitivity in the detection of metastatic disease is diminished. Atrophy and small vessel disease. No acute intracranial findings. No  mass lesion identified. Electronically Signed   By: Staci Righter M.D.   On: 07/25/2015 20:32   Nm Pet Image Initial (pi) Skull Base To Thigh  08/12/2015  CLINICAL DATA:  Initial treatment strategy for right small cell lung carcinoma. EXAM: NUCLEAR MEDICINE PET SKULL BASE TO THIGH TECHNIQUE: 8.2 mCi F-18 FDG was injected intravenously. Full-ring PET imaging was performed from the skull base to thigh after the radiotracer. CT data was obtained and used for attenuation correction and anatomic localization. FASTING BLOOD GLUCOSE:  Value: 87 mg/dl COMPARISON:  CT on 07/22/2012 FINDINGS: NECK 11 mm hypermetabolic left jugular level 2 lymph node is seen which measures 11 mm on image 22 of series 4, and has SUV max of 16.3. Right supraclavicular lymphadenopathy is seen measuring 2.0 cm on image 36/series 4 which has SUV max of 21.3. CHEST A dominant hypermetabolic mass is seen in the central right middle lobe which shows involvement of both the right hilum and mediastinum. This measures 5.0 x 5.2 cm on image 63/series 4 and has SUV max of 25.7. This is suspicious for a primary bronchogenic carcinoma. There is peripheral hypermetabolic opacity in the anterior right middle lobe most likely due to postobstructive pneumonitis. This obscures visualization of a previously seen 1.5 cm nodule at this location on previous CT. Bulky hypermetabolic mediastinal lymphadenopathy is seen in the prevascular, right paratracheal and subcarinal regions. Index area of lymphadenopathy in the right paratracheal region measures 3.4 x 4.8 cm on image 49/series 4 and has SUV max of 28.3. Mild hypermetabolic right hilar lymphadenopathy is also seen. A moderate to large right pleural effusion is seen with a focus of hypermetabolic activity in the right posterior pleural space, suggesting a malignant pleural effusion. ABDOMEN/PELVIS Diffusely increased hypermetabolic activity is seen throughout the liver corresponding with innumerable diffuse  low-attenuation liver lesions, consistent with diffuse hepatic metastases. SUV max measured in the right hepatic lobe is 23.8. Small bilateral adrenal nodules are also seen showing hypermetabolic activity, consistent with bilateral adrenal metastases. No hypermetabolic lymphadenopathy identified within the abdomen or pelvis. A 3.3 cm infrarenal abdominal aortic aneurysm is noted. SKELETON Diffuse hypermetabolic bone metastases are seen involving the left mandible, sternum, spine, bilateral scapulae and ribs as well as the pelvis, consistent with diffuse bone metastases. IMPRESSION: Dominant 5 cm hypermetabolic mass in the central right middle lobe, which shows involvement of the right hilum and mediastinum. This is suspicious for site of primary bronchogenic carcinoma. Postobstructive pneumonitis in the anterior right middle lobe obscures a 1.5 cm right middle lobe nodule in this region which was better seen on previous CT. Metastatic lymphadenopathy throughout the mediastinum, right hilum, right supraclavicular region, and left jugular level-II lymph node. Moderate to large right pleural effusion, likely malignant. Diffuse liver metastases and probable small bilateral adrenal metastases. Diffuse bone metastases. 3.3 cm infrarenal abdominal aortic aneurysm incidentally noted. Electronically Signed   By: Earle Gell M.D.   On: 08/12/2015 12:57   Ir Fluoro Guide Cv Line  Left  08/05/2015  INDICATION: 82 year old female with a history of small cell lung carcinoma EXAM: IMPLANTED PORT A CATH PLACEMENT WITH ULTRASOUND AND FLUOROSCOPIC GUIDANCE MEDICATIONS: 1 mg vancomycin; The antibiotic was administered within an appropriate time interval prior to skin puncture. ANESTHESIA/SEDATION: Moderate (conscious) sedation was employed during this procedure. A total of Versed 2.0 mg and Fentanyl 100 mcg was administered intravenously. Moderate Sedation Time: 53 minutes. The patient's level of consciousness and vital signs were  monitored continuously by radiology nursing throughout the procedure under my direct supervision. FLUOROSCOPY TIME:  Five minutes, 0 seconds (062 mGy) COMPLICATIONS: None PROCEDURE: The procedure, risks, benefits, and alternatives were explained to the patient. Questions regarding the procedure were encouraged and answered. The patient understands and consents to the procedure. Ultrasound survey was performed with images stored and sent to PACs. The right neck and chest was prepped with chlorhexidine, and draped in the usual sterile fashion using maximum barrier technique (cap and mask, sterile gown, sterile gloves, large sterile sheet, [place]illi hygiene and cutaneous antiseptic). Antibiotic prophylaxis was provided with 1.0g vancomycin administered IV one hour prior to skin incision. Local anesthesia was attained by infiltration with 1% lidocaine without epinephrine. Ultrasound survey of the right neck demonstrated occlusion of the right internal jugular vein. The skin and subcutaneous tissues were infiltrated 1% lidocaine, and an attempt was made to access the base of the right external jugular vein. This was discovered to also be thrombosed. The left neck and chest were then prepped with chlorhexidine and draped in the usual sterile fashion using maximum barrier technique. Ultrasound demonstrated patency of the left internal jugular vein, and this was documented with an image. Under real-time ultrasound guidance, this vein was accessed with a 21 gauge micropuncture needle and image documentation was performed. A small dermatotomy was made at the access site with an 11 scalpel. A 0.018" wire was advanced into the brachycephalic vein. The access needle exchanged for a 15F micropuncture vascular sheath. Given the course of the 018 wire into the right brachycephalic vein, a limited venogram was performed. An 015 wire was then advanced through the sheath into the brachycephalic vein a 5 French sheath was placed at the  puncture left internal jugular vein. Angled multipurpose catheter and the 035 wire were then used in an attempt to direct the wire into the inferior vena cava. Given the distorted anatomy at the right hilum, the angle approach through the inferior cavoatrial junction was sub optimal. The length of the internal catheter was then estimated by withdrawing the 035 wire through the Kumpe the catheter, with the targeted anatomy at the distal brachycephalic vein, and horizontal course into the superior cavoatrial junction. The wire was removed and the Kumpe the catheter remain to the 5 [place] sheath. An appropriate location for the subcutaneous reservoir was selected below the clavicle and an incision was made through the skin and underlying soft tissues. The subcutaneous tissues were then dissected using a combination of blunt and sharp surgical technique and a pocket was formed. A single lumen power injectable portacatheter was then tunneled through the subcutaneous tissues from the pocket to the dermatotomy and the port reservoir placed within the subcutaneous pocket. The venous access site was then serially dilated and a peel away vascular sheath placed over the 035 wire. The wire was removed and the port catheter advanced into position under fluoroscopic guidance. The catheter tip was positioned in the horizontal portion of the brachycephalic vein. This was documented with a spot image. The port catheter  was then tested and found to flush and aspirate well. The port was flushed with saline followed by 100 units/mL heparinized saline. The pocket was then closed in two layers using first subdermal inverted interrupted absorbable sutures followed by a running subcuticular suture. The epidermis was then sealed with Dermabond. The dermatotomy at the venous access site was also seal with Dermabond. Patient tolerated the procedure well and remained hemodynamically stable throughout. No complications encountered and no  significant blood loss encountered FINDINGS: Occlusion of the right jugular vein discovered with ultrasound. Patent left internal jugular vein. Distortion of the right hilar anatomy, with engorged left brachycephalic vein. Final image demonstrates port catheter in position with the tip of the catheter at the distal brachial cephalic vein. The tip is within the horizontal segment of the vein (AP dimension), before its confluence with the superior cavoatrial junction. IMPRESSION: Status post left IJ port catheter placement. Catheter ready for use. Signed, Dulcy Fanny. Earleen Newport, DO [facility] Radiology Electronically Signed   By: Corrie Mckusick D.O.   On: 08/05/2015 12:17   Ir US Guide Vasc Access Left  08/05/2015  INDICATION: 82 year old female with a history of small cell lung carcinoma EXAM: IMPLANTED PORT A CATH PLACEMENT WITH ULTRASOUND AND FLUOROSCOPIC GUIDANCE MEDICATIONS: 1 mg vancomycin; The antibiotic was administered within an appropriate time interval prior to skin puncture. ANESTHESIA/SEDATION: Moderate (conscious) sedation was employed during this procedure. A total of Versed 2.0 mg and Fentanyl 100 mcg was administered intravenously. Moderate Sedation Time: 53 minutes. The patient's level of consciousness and vital signs were monitored continuously by radiology nursing throughout the procedure under my direct supervision. FLUOROSCOPY TIME:  Five minutes, 0 seconds (196 mGy) COMPLICATIONS: None PROCEDURE: The procedure, risks, benefits, and alternatives were explained to the patient. Questions regarding the procedure were encouraged and answered. The patient understands and consents to the procedure. Ultrasound survey was performed with images stored and sent to PACs. The right neck and chest was prepped with chlorhexidine, and draped in the usual sterile fashion using maximum barrier technique (cap and mask, sterile gown, sterile gloves, large sterile sheet,  [place] hygiene and cutaneous antiseptic). Antibiotic prophylaxis was provided with 1.0g vancomycin administered IV one hour prior to skin incision. Local anesthesia was attained by infiltration with 1% lidocaine without epinephrine. Ultrasound survey of the right neck demonstrated occlusion of the right internal jugular vein. The skin and subcutaneous tissues were infiltrated 1% lidocaine, and an attempt was made to access the base of the right external jugular vein. This was discovered to also be thrombosed. The left neck and chest were then prepped with chlorhexidine and draped in the usual sterile fashion using maximum barrier technique. Ultrasound demonstrated patency of the left internal jugular vein, and this was documented with an image. Under real-time ultrasound guidance, this vein was accessed with a 21 gauge micropuncture needle and image documentation was performed. A small dermatotomy was made at the access site with an 11 scalpel. A 0.018" wire was advanced into the brachycephalic vein. The access needle exchanged for a 27F micropuncture vascular sheath. Given the course of the 018 wire into the right brachycephalic vein, a limited venogram was performed. An 015 wire was then advanced through the sheath into the brachycephalic vein a 5 French sheath was placed at the puncture left internal jugular vein. Angled multipurpose catheter and the 035 wire were then used in an attempt to direct the wire into the inferior vena cava. Given the distorted anatomy at the right  hilum, the angle approach through the inferior cavoatrial junction was sub optimal. The length of the internal catheter was then estimated by withdrawing the 035 wire through the Kumpe the catheter, with the targeted anatomy at the distal brachycephalic vein, and horizontal course into the superior cavoatrial junction. The wire was removed and the Kumpe the catheter remain to the 5 [place] sheath. An appropriate location for the subcutaneous  reservoir was selected below the clavicle and an incision was made through the skin and underlying soft tissues. The subcutaneous tissues were then dissected using a combination of blunt and sharp surgical technique and a pocket was formed. A single lumen power injectable portacatheter was then tunneled through the subcutaneous tissues from the pocket to the dermatotomy and the port reservoir placed within the subcutaneous pocket. The venous access site was then serially dilated and a peel away vascular sheath placed over the 035 wire. The wire was removed and the port catheter advanced into position under fluoroscopic guidance. The catheter tip was positioned in the horizontal portion of the brachycephalic vein. This was documented with a spot image. The port catheter was then tested and found to flush and aspirate well. The port was flushed with saline followed by 100 units/mL heparinized saline. The pocket was then closed in two layers using first subdermal inverted interrupted absorbable sutures followed by a running subcuticular suture. The epidermis was then sealed with Dermabond. The dermatotomy at the venous access site was also seal with Dermabond. Patient tolerated the procedure well and remained hemodynamically stable throughout. 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 Patent left internal jugular vein. Distortion of the right hilar anatomy, with engorged left brachycephalic vein. Final image demonstrates port catheter in position with the tip of the catheter at the distal brachial cephalic vein. The tip is within the horizontal segment of the vein (AP dimension), before its confluence with the superior cavoatrial junction. IMPRESSION: Status post left IJ port catheter placement. Catheter ready for use. Signed, Dulcy Fanny. Earleen Newport, DO [facility] Radiology  Electronically Signed   By: Corrie Mckusick D.O.   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 A lengthy discussion was had with the patient and her family about her current disease stage, prognosis and treatment options. The patient understands that she has incurable condition in order the treatment will be off palliative nature. We briefly reviewed the results of her PET scan. She understands that this is merely a baseline scan that will be of more use for comparison to future scans.  There are mets to the bone, lymph nodes, liver, and adrenal glands. She also has a moderate to large right pleural effusion. She is currently being treated with carboplatin AUC 4 on day 1 with etoposide '90mg'$ /m2 on days 1, 2, and 3. Neulasta will be given on day 4. She is status post 1 cycle.  The labs were reviewed in detail. Her LFTs have dropped by 50%. Her potassium is 5.6 today (high). Dr. [name] was informed, and no further instructions were given.  The patient will return next week for labs, and in 2 weeks for cycle #2.  She will meet with the nutritionist will meet with her during this visit.  All questions were answered. The patient knows to call the clinic with  any problems, questions or concerns. We can certainly see the patient much sooner if necessary.  Laurie Panda, NP 08/20/2015

## 2015-08-20 NOTE — Telephone Encounter (Signed)
lvm for patient to inform her of lab only appt per HB 4/4 pof

## 2015-08-23 ENCOUNTER — Inpatient Hospital Stay (HOSPITAL_COMMUNITY)
Admission: AD | Admit: 2015-08-23 | Discharge: 2015-08-30 | DRG: 371 | Disposition: A | Discharge status: Skilled Nursing Facility | Payer: Medicare Other | Source: Ambulatory Visit | Attending: Internal Medicine | Admitting: Internal Medicine

## 2015-08-23 ENCOUNTER — Inpatient Hospital Stay (HOSPITAL_COMMUNITY): Payer: Medicare Other

## 2015-08-23 ENCOUNTER — Encounter (HOSPITAL_COMMUNITY): Payer: Self-pay | Admitting: Internal Medicine

## 2015-08-23 ENCOUNTER — Telehealth: Payer: Self-pay

## 2015-08-23 ENCOUNTER — Ambulatory Visit (HOSPITAL_BASED_OUTPATIENT_CLINIC_OR_DEPARTMENT_OTHER): Payer: Medicare Other

## 2015-08-23 ENCOUNTER — Ambulatory Visit (HOSPITAL_BASED_OUTPATIENT_CLINIC_OR_DEPARTMENT_OTHER): Payer: Medicare Other | Admitting: Nurse Practitioner

## 2015-08-23 ENCOUNTER — Other Ambulatory Visit (HOSPITAL_COMMUNITY)
Admission: AD | Admit: 2015-08-23 | Discharge: 2015-08-23 | Disposition: A | Discharge status: Home / Self Care | Payer: Medicare Other | Source: Ambulatory Visit | Attending: Internal Medicine | Admitting: Internal Medicine

## 2015-08-23 VITALS — BP 133/76 | HR 98 | Temp 98.3°F | Resp 17 | Wt 161.7 lb

## 2015-08-23 DIAGNOSIS — Z880 Allergy status to penicillin: Secondary | ICD-10-CM | POA: Diagnosis not present

## 2015-08-23 DIAGNOSIS — D61810 Antineoplastic chemotherapy induced pancytopenia: Secondary | ICD-10-CM | POA: Diagnosis present

## 2015-08-23 DIAGNOSIS — Z79899 Other long term (current) drug therapy: Secondary | ICD-10-CM | POA: Diagnosis not present

## 2015-08-23 DIAGNOSIS — E8770 Fluid overload, unspecified: Secondary | ICD-10-CM | POA: Diagnosis present

## 2015-08-23 DIAGNOSIS — J9610 Chronic respiratory failure, unspecified whether with hypoxia or hypercapnia: Secondary | ICD-10-CM | POA: Diagnosis present

## 2015-08-23 DIAGNOSIS — C3490 Malignant neoplasm of unspecified part of unspecified bronchus or lung: Secondary | ICD-10-CM

## 2015-08-23 DIAGNOSIS — D701 Agranulocytosis secondary to cancer chemotherapy: Secondary | ICD-10-CM

## 2015-08-23 DIAGNOSIS — A047 Enterocolitis due to Clostridium difficile: Secondary | ICD-10-CM | POA: Diagnosis present

## 2015-08-23 DIAGNOSIS — C7970 Secondary malignant neoplasm of unspecified adrenal gland: Secondary | ICD-10-CM

## 2015-08-23 DIAGNOSIS — J449 Chronic obstructive pulmonary disease, unspecified: Secondary | ICD-10-CM | POA: Diagnosis present

## 2015-08-23 DIAGNOSIS — R5383 Other fatigue: Secondary | ICD-10-CM

## 2015-08-23 DIAGNOSIS — A0472 Enterocolitis due to Clostridium difficile, not specified as recurrent: Secondary | ICD-10-CM

## 2015-08-23 DIAGNOSIS — R197 Diarrhea, unspecified: Secondary | ICD-10-CM | POA: Diagnosis present

## 2015-08-23 DIAGNOSIS — C7951 Secondary malignant neoplasm of bone: Secondary | ICD-10-CM | POA: Diagnosis present

## 2015-08-23 DIAGNOSIS — C787 Secondary malignant neoplasm of liver and intrahepatic bile duct: Secondary | ICD-10-CM | POA: Diagnosis present

## 2015-08-23 DIAGNOSIS — C7972 Secondary malignant neoplasm of left adrenal gland: Secondary | ICD-10-CM | POA: Diagnosis present

## 2015-08-23 DIAGNOSIS — Z7982 Long term (current) use of aspirin: Secondary | ICD-10-CM | POA: Diagnosis not present

## 2015-08-23 DIAGNOSIS — Z7952 Long term (current) use of systemic steroids: Secondary | ICD-10-CM

## 2015-08-23 DIAGNOSIS — C3491 Malignant neoplasm of unspecified part of right bronchus or lung: Secondary | ICD-10-CM

## 2015-08-23 DIAGNOSIS — E860 Dehydration: Secondary | ICD-10-CM

## 2015-08-23 DIAGNOSIS — T458X5A Adverse effect of other primarily systemic and hematological agents, initial encounter: Secondary | ICD-10-CM | POA: Diagnosis present

## 2015-08-23 DIAGNOSIS — Z8673 Personal history of transient ischemic attack (TIA), and cerebral infarction without residual deficits: Secondary | ICD-10-CM

## 2015-08-23 DIAGNOSIS — J90 Pleural effusion, not elsewhere classified: Secondary | ICD-10-CM | POA: Diagnosis present

## 2015-08-23 DIAGNOSIS — I10 Essential (primary) hypertension: Secondary | ICD-10-CM | POA: Diagnosis present

## 2015-08-23 DIAGNOSIS — H3530 Unspecified macular degeneration: Secondary | ICD-10-CM | POA: Diagnosis present

## 2015-08-23 DIAGNOSIS — D696 Thrombocytopenia, unspecified: Secondary | ICD-10-CM

## 2015-08-23 DIAGNOSIS — E785 Hyperlipidemia, unspecified: Secondary | ICD-10-CM | POA: Diagnosis present

## 2015-08-23 DIAGNOSIS — E876 Hypokalemia: Secondary | ICD-10-CM | POA: Diagnosis present

## 2015-08-23 DIAGNOSIS — C801 Malignant (primary) neoplasm, unspecified: Secondary | ICD-10-CM | POA: Diagnosis present

## 2015-08-23 DIAGNOSIS — E46 Unspecified protein-calorie malnutrition: Secondary | ICD-10-CM

## 2015-08-23 DIAGNOSIS — T451X5A Adverse effect of antineoplastic and immunosuppressive drugs, initial encounter: Secondary | ICD-10-CM | POA: Diagnosis present

## 2015-08-23 DIAGNOSIS — C7971 Secondary malignant neoplasm of right adrenal gland: Secondary | ICD-10-CM | POA: Diagnosis present

## 2015-08-23 DIAGNOSIS — D702 Other drug-induced agranulocytosis: Secondary | ICD-10-CM | POA: Diagnosis not present

## 2015-08-23 DIAGNOSIS — R0602 Shortness of breath: Secondary | ICD-10-CM

## 2015-08-23 DIAGNOSIS — R531 Weakness: Secondary | ICD-10-CM

## 2015-08-23 DIAGNOSIS — R0789 Other chest pain: Secondary | ICD-10-CM | POA: Diagnosis present

## 2015-08-23 DIAGNOSIS — E8809 Other disorders of plasma-protein metabolism, not elsewhere classified: Secondary | ICD-10-CM

## 2015-08-23 DIAGNOSIS — Z881 Allergy status to other antibiotic agents status: Secondary | ICD-10-CM

## 2015-08-23 DIAGNOSIS — Z9981 Dependence on supplemental oxygen: Secondary | ICD-10-CM

## 2015-08-23 DIAGNOSIS — D72829 Elevated white blood cell count, unspecified: Secondary | ICD-10-CM | POA: Diagnosis present

## 2015-08-23 DIAGNOSIS — R059 Cough, unspecified: Secondary | ICD-10-CM

## 2015-08-23 DIAGNOSIS — Z87891 Personal history of nicotine dependence: Secondary | ICD-10-CM | POA: Diagnosis not present

## 2015-08-23 DIAGNOSIS — D6959 Other secondary thrombocytopenia: Secondary | ICD-10-CM | POA: Diagnosis present

## 2015-08-23 DIAGNOSIS — I5033 Acute on chronic diastolic (congestive) heart failure: Secondary | ICD-10-CM | POA: Diagnosis present

## 2015-08-23 DIAGNOSIS — J4489 Other specified chronic obstructive pulmonary disease: Secondary | ICD-10-CM | POA: Diagnosis present

## 2015-08-23 DIAGNOSIS — J9611 Chronic respiratory failure with hypoxia: Secondary | ICD-10-CM | POA: Diagnosis not present

## 2015-08-23 DIAGNOSIS — R05 Cough: Secondary | ICD-10-CM

## 2015-08-23 DIAGNOSIS — Z888 Allergy status to other drugs, medicaments and biological substances status: Secondary | ICD-10-CM

## 2015-08-23 DIAGNOSIS — I509 Heart failure, unspecified: Secondary | ICD-10-CM

## 2015-08-23 DIAGNOSIS — R0600 Dyspnea, unspecified: Secondary | ICD-10-CM

## 2015-08-23 HISTORY — DX: Transient cerebral ischemic attack, unspecified: G45.9

## 2015-08-23 LAB — COMPREHENSIVE METABOLIC PANEL
ALBUMIN: 2.6 g/dL — AB (ref 3.5–5.0)
ALK PHOS: 222 U/L — AB (ref 40–150)
ALT: 58 U/L — ABNORMAL HIGH (ref 0–55)
AST: 42 U/L — AB (ref 5–34)
Anion Gap: 9 mEq/L (ref 3–11)
BILIRUBIN TOTAL: 0.86 mg/dL (ref 0.20–1.20)
BUN: 19.8 mg/dL (ref 7.0–26.0)
CALCIUM: 8.9 mg/dL (ref 8.4–10.4)
CO2: 23 mEq/L (ref 22–29)
Chloride: 105 mEq/L (ref 98–109)
Creatinine: 0.8 mg/dL (ref 0.6–1.1)
EGFR: 73 mL/min/{1.73_m2} — AB (ref >90)
GLUCOSE: 188 mg/dL — AB (ref 70–140)
Potassium: 3.8 mEq/L (ref 3.5–5.1)
SODIUM: 138 meq/L (ref 136–145)
TOTAL PROTEIN: 6 g/dL — AB (ref 6.4–8.3)

## 2015-08-23 LAB — CBC WITH DIFFERENTIAL/PLATELET
BASO%: 0 % (ref 0.0–2.0)
BASOS ABS: 0 10*3/uL (ref 0.0–0.1)
EOS ABS: 0 10*3/uL (ref 0.0–0.5)
EOS%: 0 % (ref 0.0–7.0)
HEMATOCRIT: 34.8 % (ref 34.8–46.6)
HEMOGLOBIN: 11.6 g/dL (ref 11.6–15.9)
LYMPH#: 0.2 10*3/uL — AB (ref 0.9–3.3)
LYMPH%: 87 % — ABNORMAL HIGH (ref 14.0–49.7)
MCH: 29.7 pg (ref 25.1–34.0)
MCHC: 33.3 g/dL (ref 31.5–36.0)
MCV: 89.2 fL (ref 79.5–101.0)
MONO#: 0 10*3/uL — ABNORMAL LOW (ref 0.1–0.9)
MONO%: 8.7 % (ref 0.0–14.0)
NEUT%: 4.3 % — ABNORMAL LOW (ref 38.4–76.8)
NEUTROS ABS: 0 10*3/uL — AB (ref 1.5–6.5)
Platelets: 57 10*3/uL — ABNORMAL LOW (ref 145–400)
RBC: 3.9 10*6/uL (ref 3.70–5.45)
RDW: 15.5 % — AB (ref 11.2–14.5)
WBC: 0.2 10*3/uL — AB (ref 3.9–10.3)

## 2015-08-23 LAB — C DIFFICILE QUICK SCREEN W PCR REFLEX
C DIFFICLE (CDIFF) ANTIGEN: POSITIVE — AB
C Diff interpretation: POSITIVE
C Diff toxin: POSITIVE — AB

## 2015-08-23 LAB — PROTIME-INR
INR: 1.31 (ref 0.00–1.49)
Prothrombin Time: 15.9 seconds — ABNORMAL HIGH (ref 11.6–15.2)

## 2015-08-23 LAB — TROPONIN I

## 2015-08-23 LAB — URINALYSIS, ROUTINE W REFLEX MICROSCOPIC
Bilirubin Urine: NEGATIVE
Glucose, UA: 100 mg/dL — AB
HGB URINE DIPSTICK: NEGATIVE
Ketones, ur: NEGATIVE mg/dL
LEUKOCYTES UA: NEGATIVE
Nitrite: NEGATIVE
PROTEIN: 100 mg/dL — AB
Specific Gravity, Urine: 1.026 (ref 1.005–1.030)
pH: 6 (ref 5.0–8.0)

## 2015-08-23 LAB — URINE MICROSCOPIC-ADD ON

## 2015-08-23 LAB — BRAIN NATRIURETIC PEPTIDE: B Natriuretic Peptide: 202.6 pg/mL — ABNORMAL HIGH (ref 0.0–100.0)

## 2015-08-23 LAB — HEMOGLOBIN AND HEMATOCRIT, BLOOD
HEMATOCRIT: 30.3 % — AB (ref 36.0–46.0)
Hemoglobin: 10.2 g/dL — ABNORMAL LOW (ref 12.0–15.0)

## 2015-08-23 MED ORDER — ALPRAZOLAM 0.25 MG PO TABS
0.2500 mg | ORAL_TABLET | Freq: Three times a day (TID) | ORAL | Status: DC | PRN
  Administered 2015-08-24 – 2015-08-29 (×4): 0.25 mg via ORAL
  Filled 2015-08-23 (×4): qty 1

## 2015-08-23 MED ORDER — HYDROCODONE-ACETAMINOPHEN 5-325 MG PO TABS
1.0000 | ORAL_TABLET | ORAL | Status: DC | PRN
  Administered 2015-08-23 – 2015-08-24 (×3): 1 via ORAL
  Filled 2015-08-23: qty 2
  Filled 2015-08-23 (×2): qty 1

## 2015-08-23 MED ORDER — SODIUM CHLORIDE 0.9 % IV BOLUS (SEPSIS): 500.0000 mL | Freq: Once | INTRAVENOUS | Status: DC

## 2015-08-23 MED ORDER — PREDNISONE 5 MG PO TABS
10.0000 mg | ORAL_TABLET | Freq: Every day | ORAL | Status: DC
  Administered 2015-08-24 – 2015-08-30 (×7): 10 mg via ORAL
  Filled 2015-08-23 (×7): qty 2

## 2015-08-23 MED ORDER — PANTOPRAZOLE SODIUM 40 MG IV SOLR
40.0000 mg | Freq: Two times a day (BID) | INTRAVENOUS | Status: DC
  Administered 2015-08-23 – 2015-08-26 (×6): 40 mg via INTRAVENOUS
  Filled 2015-08-23 (×6): qty 40

## 2015-08-23 MED ORDER — MORPHINE SULFATE (PF) 2 MG/ML IV SOLN: 2.0000 mg | INTRAVENOUS | Status: DC | PRN

## 2015-08-23 MED ORDER — IPRATROPIUM-ALBUTEROL 0.5-2.5 (3) MG/3ML IN SOLN
3.0000 mL | RESPIRATORY_TRACT | Status: DC | PRN
  Filled 2015-08-23: qty 3

## 2015-08-23 MED ORDER — SODIUM CHLORIDE 0.9 % IV SOLN
INTRAVENOUS | Status: DC
  Administered 2015-08-23: 20:00:00 via INTRAVENOUS

## 2015-08-23 MED ORDER — VANCOMYCIN 50 MG/ML ORAL SOLUTION
125.0000 mg | Freq: Four times a day (QID) | ORAL | Status: DC
  Administered 2015-08-23 – 2015-08-30 (×28): 125 mg via ORAL
  Filled 2015-08-23 (×31): qty 2.5

## 2015-08-23 MED ORDER — SODIUM CHLORIDE 0.9 % IV SOLN
INTRAVENOUS | Status: AC
Start: 2015-08-23 — End: 2015-08-23
  Administered 2015-08-23: 15:00:00 via INTRAVENOUS

## 2015-08-23 MED ORDER — HYDROCODONE-ACETAMINOPHEN 5-325 MG PO TABS
1.0000 | ORAL_TABLET | Freq: Once | ORAL | Status: AC
  Administered 2015-08-23: 1 via ORAL

## 2015-08-23 MED ORDER — ADULT MULTIVITAMIN W/MINERALS CH
1.0000 | ORAL_TABLET | Freq: Every day | ORAL | Status: DC
  Administered 2015-08-24 – 2015-08-30 (×7): 1 via ORAL
  Filled 2015-08-23 (×8): qty 1

## 2015-08-23 MED ORDER — HYDROCODONE-ACETAMINOPHEN 5-325 MG PO TABS
ORAL_TABLET | ORAL | Status: AC
  Filled 2015-08-23: qty 1

## 2015-08-23 MED ORDER — SODIUM CHLORIDE 0.9 % IV SOLN: INTRAVENOUS | Status: DC

## 2015-08-23 MED ORDER — ACETAMINOPHEN 500 MG PO TABS: 500.0000 mg | ORAL_TABLET | Freq: Four times a day (QID) | ORAL | Status: DC | PRN

## 2015-08-23 MED ORDER — TRAMADOL HCL 50 MG PO TABS: 50.0000 mg | ORAL_TABLET | Freq: Four times a day (QID) | ORAL | Status: DC | PRN

## 2015-08-23 MED ORDER — LOSARTAN POTASSIUM 50 MG PO TABS
50.0000 mg | ORAL_TABLET | Freq: Every day | ORAL | Status: DC
  Administered 2015-08-24 – 2015-08-30 (×7): 50 mg via ORAL
  Filled 2015-08-23 (×8): qty 1

## 2015-08-23 MED ORDER — IPRATROPIUM-ALBUTEROL 0.5-2.5 (3) MG/3ML IN SOLN
3.0000 mL | Freq: Four times a day (QID) | RESPIRATORY_TRACT | Status: DC
  Administered 2015-08-23: 3 mL via RESPIRATORY_TRACT
  Filled 2015-08-23: qty 3

## 2015-08-23 MED ORDER — IPRATROPIUM-ALBUTEROL 0.5-2.5 (3) MG/3ML IN SOLN
3.0000 mL | Freq: Three times a day (TID) | RESPIRATORY_TRACT | Status: DC
  Administered 2015-08-24 – 2015-08-30 (×19): 3 mL via RESPIRATORY_TRACT
  Filled 2015-08-23 (×19): qty 3

## 2015-08-23 MED ORDER — FUROSEMIDE 10 MG/ML IJ SOLN
40.0000 mg | Freq: Once | INTRAMUSCULAR | Status: AC
  Administered 2015-08-23: 40 mg via INTRAVENOUS
  Filled 2015-08-23: qty 4

## 2015-08-23 NOTE — Telephone Encounter (Signed)
Pt called stating she has been having body pains yesterday and today, especially her neck, upper back, chest and stomach. She denies fever. She has a little cough but "it hurts terribly". Brings up minimal sputum. Some SOB "but not severe" Denies nausea. "but my stools are not normal". She described tarry stool and said they were black. She agreed they were sticky when asked. She has had 5-6 stools today and not as many yesterday. She is fatiqued. S/w Selena Lesser and told pt to come to [facility] right now. Pt sister will drive her.

## 2015-08-23 NOTE — Progress Notes (Signed)
Acceptance note  Received call from Ms. Berniece Salines, Oncology . 82 year old female with history of COPD on home oxygen, recently diagnosed lung cancer with metastases to bone, liver and adrenal gland, completed first cycle of chemotherapy, received Neulasta last Saturday, presented to [facility] with diarrhea, weakness, dehydration and some cough. Said to be hemodynamically stable and not in respiratory distress. Lab work shows severe neutropenia and thrombocytopenia. Was advised not to give her any Neulasta at this time. Rectal exam by Ms. Berniece Salines: Green stools. C. difficile PCR +.  Accepted admission to medical floor, inpatient status.  Vernell Leep, MD, FACP, FHM. [facility] Pager [phone]  If 7PM-7AM, please contact night-coverage [email or web address] Password [facility] 08/23/2015, 5:34 PM

## 2015-08-23 NOTE — Patient Instructions (Signed)

## 2015-08-23 NOTE — H&P (Addendum)
[facility] History and Physical  [name] [name]:[id number] DOB: 05-12-1929 DOA: 08/23/2015  Referring physician: Oncology PA on behalf of Dr. [name] PCP: [name], MD  Specialists: Dr. [name], Oncology  Chief Complaint: Diarrhea, fatigue, shortness of breath  HPI: Meghan Castro is a 82 y.o. woman with a history of COPD on home oxygen at 2L [place], HTN, HLD, TIA, and recent diagnosis of extensive small cell lung cancer (mets to bone, liver, adrenal glands; incurable; started palliative chemotherapy with carboplatin and etoposide on March 28th; received Neulasta injection on April 1st) who was referred for direct admission from the oncology office for management of C diff diarrhea in the setting of profound neutropenia.  She is accompanied by two sisters, who confirm that the patient has had a rapid change in status in the past 24 hours.  She has experienced progressive fatigue, decreased appetite, and pain in her chest, neck, shoulders, and back.  Around 3AM, she developed diarrhea that was initially "dark and tarry", then green, then clear "like water".  She has had 7-8 loose stools today.  She has seen some bright red blood on the toilet paper when she wipes.  No history of ulcers or GI bleed.  No associated nausea or vomiting.  She has not taken iron supplements or Pepto-Bismol.  She has had some abdominal pain.  No fevers, chills, or sweats.  No light-headedness or dizziness.  She has had increased ankle edema, but also reports that she has not taken lasix in almost one month.  Evaluation in the oncology office today concerning for a WBC count of 0.2, platelet count of 57, and positive stool C diff toxin and antigen.  She has been referred for direct admission to the hospital.  Per documentation by Dr. Algis Liming, we will not give additional neulasta at this point.  Review of Systems: 12 systems reviewed and negative except as stated in HPI.  Past Medical History  Diagnosis Date  .  Chronic airway obstruction, not elsewhere classified   . Unspecified sinusitis (chronic)   . Diaphragmatic hernia without mention of obstruction or gangrene   . Macular degeneration (senile) of retina, unspecified   . Other and unspecified hyperlipidemia   . Unspecified essential hypertension   . Unspecified disorder resulting from impaired renal function   . Mediastinal adenopathy   . Cancer [facility])     metastatic lung cancer  . TIA (transient ischemic attack)    Past Surgical History  Procedure Laterality Date  . Arm fracture    . Cholecystectomy    . Total abdominal hysterectomy    . Bilateral salpingoophorectomy    . Incisional hernia repair      abdominal  . Appendectomy    . Cataract extraction    . Endobronchial ultrasound Bilateral 07/24/2015    Procedure: ENDOBRONCHIAL ULTRASOUND;  Surgeon: [name], MD;  Location: [facility] ENDOSCOPY;  Service: Cardiopulmonary;  Laterality: Bilateral;   Social History:  Social History   Social History Narrative  Former tobacco use.  No EtOH or illicit drug use.  She is a widow.  She has one adult son.  Allergies  Allergen Reactions  . Other Shortness Of Breath    *Perfumes*  . Penicillins     Has patient had a PCN reaction causing immediate rash, facial/tongue/throat swelling, SOB or lightheadedness with hypotension: No Has patient had a PCN reaction causing severe rash involving mucus membranes or skin necrosis: No Has patient had a PCN reaction that required hospitalization No Has  patient had a PCN reaction occurring within the last 10 years: No If all of the above answers are "NO", then may proceed with Cephalosporin use.   . Pravastatin Sodium     REACTION: swelling in tounge and feet turn black  . Quinolones Other (See Comments)    hyper  . Moxifloxacin Anxiety    Family History  Problem Relation Age of Onset  . Emphysema Father   . Heart disease Mother   . Lung cancer Father   Her son also has lung cancer.  Prior  to Admission medications   Medication Sig Start Date End Date Taking? Authorizing Provider  acetaminophen (TYLENOL) 500 MG tablet Take 500 mg by mouth every 6 (six) hours as needed for mild pain or moderate pain. Reported on 08/13/2015    Historical Provider, MD  ALPRAZolam Duanne Moron) 0.25 MG tablet Take 1 tablet (0.25 mg total) by mouth 3 (three) times daily as needed for anxiety. 08/13/15   Laurie Panda, NP  aspirin 325 MG EC tablet Take 325 mg by mouth daily.      Historical Provider, MD  Ginger, Zingiber officinalis, (GINGER ROOT) 500 MG CAPS Take 2 capsules by mouth daily. Reported on 08/13/2015    Historical Provider, MD  lidocaine-prilocaine (EMLA) cream Apply 1 application topically as needed. 08/06/15   Curt Bears, MD  losartan (COZAAR) 50 MG tablet Take 50 mg by mouth daily.    Historical Provider, MD  Multiple Vitamin (MULTIVITAMIN WITH MINERALS) TABS tablet Take 1 tablet by mouth daily.    Historical Provider, MD  Omega-3 Fatty Acids (FISH OIL) 1000 MG CAPS Take 1 capsule by mouth daily.    Historical Provider, MD  predniSONE (DELTASONE) 10 MG tablet Take 1 tablet (10 mg total) by mouth daily with breakfast. 08/13/15   Laurie Panda, NP  PROAIR HFA 108 (90 BASE) MCG/ACT inhaler INHALE 2 PUFFS INTO THE LUNGS EVERY 4 (FOUR) HOURS AS NEEDED FOR WHEEZING OR SHORTNESS OF BREATH. 04/19/15   Deneise Lever, MD  prochlorperazine (COMPAZINE) 10 MG tablet Take 1 tablet (10 mg total) by mouth every 6 (six) hours as needed for nausea or vomiting. 08/13/15   Laurie Panda, NP  Tiotropium Bromide Monohydrate (SPIRIVA RESPIMAT) 2.5 MCG/ACT AERS Inhale 2 puffs into the lungs daily. 05/17/15   Deneise Lever, MD  traMADol (ULTRAM) 50 MG tablet Take 1 tablet (50 mg total) by mouth every 6 (six) hours as needed. 08/23/15   Susanne Borders, NP   Physical Exam: Filed Vitals:   08/23/15 1853  BP: 133/72  Pulse: 80  Temp: 98 F (36.7 C)  TempSrc: Oral  Resp: 16  Height: '5\' 2"'$  (1.575 m)   Weight: 73.347 kg (161 lb 11.2 oz)  SpO2: 100%    General:  Awake and alert.  Oriented to person, place, time and situation.  NAD.  Eyes: EOMI  ENT: Dry mucous membranes.  Neck: Supple.    Cardiovascular: NR/RR.  1+ pitting edema bilaterally.  Respiratory: Diminished on the right, no wheeze, coarse on the left, breaths are shallow  Abdomen: Soft/NT/ND.  Bowel sounds are hypoactive.  No guarding.  Skin: Warm and dry.  Musculoskeletal: Moves all four extremities spontaneously.  Psychiatric: Normal affect.  Neurologic: No focal deficits.  Labs on Admission:  Basic Metabolic Panel:  Recent Labs Lab 08/20/15 1314 08/23/15 1344  NA 142 138  K 5.6 Repeated and Verified* 3.8  CO2 27 23  GLUCOSE 121 188*  BUN 30.3* 19.8  CREATININE  0.8 0.8  CALCIUM 9.3 8.9   Liver Function Tests:  Recent Labs Lab 08/20/15 1314 08/23/15 1344  AST 94* 42*  ALT 87* 58*  ALKPHOS 287* 222*  BILITOT 0.77 0.86  PROT 6.0* 6.0*  ALBUMIN 2.7* 2.6*   CBC:  Recent Labs Lab 08/20/15 1314 08/23/15 1343  WBC 2.3* 0.2*  NEUTROABS 1.9 0.0*  HGB 11.0* 11.6  HCT 34.0* 34.8  MCV 91.3 89.2  PLT 133* 57*   Troponin, BNP, chest xray pending  EKG: Pending.  Assessment/Plan Principal Problem:   C. difficile diarrhea Active Problems:   Essential hypertension   COPD with chronic bronchitis (HCC)   Small cell carcinoma (HCC)   Drug-induced neutropenia (HCC)   Oxygen dependent  Admit to med surg for neutropenia, thrombocytopenia (secondary to chemotherapy) and C diff diarrhea --Neutropenic precautions --Fall precautions --Oral vancomycin therapy --Cautious IV fluid resuscitation given swelling, shortness of breath --Daily CBC with diff --Stool heme occult, IV PPI, H/H at 10PM (though she does not appear to have brisk GI bleed at this point) --Abdominal exam is not concerning at this point so I will defer CT imaging for now  Atypical chest pain and shortness of breath--suspect  this is related to her know metastatic lung cancer/burden of disease.  She does not have a cardiac history. --Continue home oxygen at 2L [place] --Holding aspirin due to thrombocytopenia --Will check EKG, BNP, troponin to screen for ACS (but I have a low clinical index of suspicion at this point).  Consider echo in the morning.  May need intermittent lasix (which she has used in the past). --Portable chest xray (she has a history of a right sided pleural effusion and large hiatal hernia) --Analgesics as needed (pain improved with Vicodin in clinic; starting to recur after three hours)  History of COPD --Continue oxygen at 2L  --Scheduled duonebs q6h while here, hold Spiriva  Code Status: FULL Family Communication: Two sisters at bedside.  Family asked about their risk of C diff exposure.  I advised that strict [place] washing with soap and water and enteric precautions as directed by hospital personnel are the best defense.  They specifically asked me if they needed prophylactic antibiotics, and I told them no.  I further advised that family members with pre-existing medical conditions would need to consider personal risk before choosing to visit. Disposition Plan: Expect her to be here until Monday (at least).  Time spent: 70 minutes  Eber Jones [facility]  08/23/2015, 7:36 PM       EKG reviewed by me.  Will await official read but the baseline is affected by artifact and lead placement.  I do not believe the patient is in atrial flutter.  P and T waves are clearly identifiable and her intervals are regular.  Patient appears to be in sinus rhythm; no acute ST segment elevations.  Bilateral effusions and mild pulmonary edema on chest xray.  Will KVO IV fluids now and give IV lasix.  Foley catheter (short term) for strict I/O and comfort (patient also had diarrhea, debility, fall risk).

## 2015-08-24 ENCOUNTER — Other Ambulatory Visit: Payer: Self-pay

## 2015-08-24 ENCOUNTER — Encounter (HOSPITAL_COMMUNITY): Payer: Self-pay | Admitting: *Deleted

## 2015-08-24 DIAGNOSIS — D61810 Antineoplastic chemotherapy induced pancytopenia: Secondary | ICD-10-CM

## 2015-08-24 DIAGNOSIS — I5033 Acute on chronic diastolic (congestive) heart failure: Secondary | ICD-10-CM

## 2015-08-24 LAB — COMPREHENSIVE METABOLIC PANEL
ALT: 48 U/L (ref 14–54)
AST: 32 U/L (ref 15–41)
Albumin: 2.5 g/dL — ABNORMAL LOW (ref 3.5–5.0)
Alkaline Phosphatase: 163 U/L — ABNORMAL HIGH (ref 38–126)
Anion gap: 8 (ref 5–15)
BUN: 17 mg/dL (ref 6–20)
CHLORIDE: 103 mmol/L (ref 101–111)
CO2: 25 mmol/L (ref 22–32)
CREATININE: 0.65 mg/dL (ref 0.44–1.00)
Calcium: 8.5 mg/dL — ABNORMAL LOW (ref 8.9–10.3)
Glucose, Bld: 113 mg/dL — ABNORMAL HIGH (ref 65–99)
POTASSIUM: 3.4 mmol/L — AB (ref 3.5–5.1)
Sodium: 136 mmol/L (ref 135–145)
Total Bilirubin: 0.8 mg/dL (ref 0.3–1.2)
Total Protein: 5.4 g/dL — ABNORMAL LOW (ref 6.5–8.1)

## 2015-08-24 LAB — CBC WITH DIFFERENTIAL/PLATELET
HCT: 27.9 % — ABNORMAL LOW (ref 36.0–46.0)
HEMOGLOBIN: 9.8 g/dL — AB (ref 12.0–15.0)
MCH: 30.3 pg (ref 26.0–34.0)
MCHC: 35.1 g/dL (ref 30.0–36.0)
MCV: 86.4 fL (ref 78.0–100.0)
PLATELETS: 34 10*3/uL — AB (ref 150–400)
RBC: 3.23 MIL/uL — AB (ref 3.87–5.11)
RDW: 15.3 % (ref 11.5–15.5)
WBC: 0.1 10*3/uL — AB (ref 4.0–10.5)
nRBC: 0 /100 WBC

## 2015-08-24 LAB — TROPONIN I: TROPONIN I: 0.03 ng/mL (ref <0.031)

## 2015-08-24 MED ORDER — SACCHAROMYCES BOULARDII 250 MG PO CAPS
250.0000 mg | ORAL_CAPSULE | Freq: Two times a day (BID) | ORAL | Status: DC
  Administered 2015-08-24 – 2015-08-30 (×13): 250 mg via ORAL
  Filled 2015-08-24 (×13): qty 1

## 2015-08-24 MED ORDER — FUROSEMIDE 10 MG/ML IJ SOLN
20.0000 mg | Freq: Once | INTRAMUSCULAR | Status: AC
  Administered 2015-08-24: 20 mg via INTRAVENOUS
  Filled 2015-08-24: qty 2

## 2015-08-24 MED ORDER — POTASSIUM CHLORIDE CRYS ER 20 MEQ PO TBCR
40.0000 meq | EXTENDED_RELEASE_TABLET | Freq: Once | ORAL | Status: AC
  Administered 2015-08-24: 40 meq via ORAL
  Filled 2015-08-24: qty 2

## 2015-08-24 MED ORDER — POLYVINYL ALCOHOL 1.4 % OP SOLN
1.0000 [drp] | OPHTHALMIC | Status: DC | PRN
  Filled 2015-08-24: qty 15

## 2015-08-24 MED ORDER — HYDROCODONE-ACETAMINOPHEN 5-325 MG PO TABS
1.0000 | ORAL_TABLET | Freq: Four times a day (QID) | ORAL | Status: DC | PRN
  Administered 2015-08-24 – 2015-08-25 (×4): 1 via ORAL
  Administered 2015-08-26: 2 via ORAL
  Administered 2015-08-26 (×2): 1 via ORAL
  Administered 2015-08-27: 2 via ORAL
  Administered 2015-08-27: 1 via ORAL
  Administered 2015-08-28 – 2015-08-30 (×6): 2 via ORAL
  Filled 2015-08-24: qty 2
  Filled 2015-08-24 (×2): qty 1
  Filled 2015-08-24: qty 2
  Filled 2015-08-24: qty 1
  Filled 2015-08-24: qty 2
  Filled 2015-08-24: qty 1
  Filled 2015-08-24: qty 2
  Filled 2015-08-24: qty 1
  Filled 2015-08-24: qty 2
  Filled 2015-08-24: qty 1
  Filled 2015-08-24 (×4): qty 2
  Filled 2015-08-24 (×3): qty 1

## 2015-08-24 MED ORDER — CETYLPYRIDINIUM CHLORIDE 0.05 % MT LIQD
7.0000 mL | Freq: Two times a day (BID) | OROMUCOSAL | Status: DC
  Administered 2015-08-24 – 2015-08-29 (×11): 7 mL via OROMUCOSAL

## 2015-08-24 MED ORDER — ACETAMINOPHEN 500 MG PO TABS
500.0000 mg | ORAL_TABLET | Freq: Four times a day (QID) | ORAL | Status: DC | PRN
  Administered 2015-08-25: 500 mg via ORAL
  Filled 2015-08-24: qty 1

## 2015-08-24 NOTE — Progress Notes (Addendum)
2 RN attempted to place a foley catheter, however d/t pt's anatomy catheter was not successfully be placed. Pt at this time refusing further attempts on catheter placement. Ordering MD made aware

## 2015-08-24 NOTE — Progress Notes (Addendum)
PROGRESS NOTE    Meghan Castro  SWN:[id number]  DOB: 21-Apr-1929  DOA: 08/23/2015 PCP: [name], MD Outpatient Specialists: Oncology: Dr. Curt Bears.   Hospital course: 82 y.o. woman with a history of COPD on home oxygen at 2L [place], HTN, HLD, TIA, and recent diagnosis of extensive small cell lung cancer (mets to bone, liver, adrenal glands; incurable; started palliative chemotherapy with carboplatin and etoposide on March 28th; received Neulasta injection on April 1st) who was referred for direct admission from the oncology office for management of C diff diarrhea in the setting of profound neutropenia. Her condition deteriorated rapidly in the 24 hours prior to admission with complaints of progressive fatigue, decreased appetite, pain in her neck, chest, back, shoulders and then multiple diarrhea-greenish loose stools. Evaluation in oncologist's office significant for WBC 0.2, platelets 57, C. difficile PCR positive. Directly admitted to medical floor.   Assessment & Plan:   C. difficile diarrhea - Continue treatment with oral vancomycin and complete 14 days course. Add probiotics. Avoid unnecessary antibiotics. - Seems to be improving already without BM since last night.  Pancytopenia secondary to chemotherapy - As per discussion with oncology on 4/7, received Neulasta on 4/1 and was advised to hold off giving any more Neulasta and expect white cell to start improving in the next 2 or 3 days. No fevers. - No bleeding except for minimal blood on toilet paper. Monitor closely and transfuse if platelets <10 K or for active bleeding (discussed with Oncology on call 4/8).  - Transfuse PRBC for hemoglobin <7 g per DL.  Dyspnea - Likely multifactorial related to bilateral pleural effusions and possible pulmonary edema - Received a dose of IV Lasix overnight with good diuresis and improved dyspnea. - Unable to place Foley catheter due to anatomy. - Check 2-D echo for LV  function and follow-up repeat chest x-ray in a.m. - Provider dose of oral Lasix today.  Possible acute on chronic diastolic CHF - Management as above.  COPD - Stable without clinical features suggestive of bronchospasm.  Chronic respiratory failure, on home oxygen 2 L/m.  Hypokalemia - Replace and follow.  Lung cancer - Outpatient follow-up with oncology.  TIA - Aspirin on hold secondary to severe thrombocytopenia.  Atypical chest pain - No chest pain currently. EKG 4/8: Independently reviewed. Sinus rhythm without acute findings. QTC 457 ms. - Troponins 2 negative.   DVT prophylaxis: SCD's Code Status: Full Family Communication: None at bedside Disposition Plan: DC home when medically stable   Consultants:  None  Procedures:  None  Antimicrobials:  PO Vancomycin 4/8>   Subjective: Feels better. No diarrhea since last night. Dyspnea improved but not at baseline. Improved body pain (neck, chest, back and shoulder pain). Urinated all night and staff unable to place Foley catheter. As per RN, no acute issues.  Objective: Filed Vitals:   08/23/15 1853 08/23/15 2055 08/23/15 2140 08/24/15 0500  BP: 133/72  145/66 138/63  Pulse: 80  99 103  Temp: 98 F (36.7 C)  97.6 F (36.4 C) 98.5 F (36.9 C)  TempSrc: Oral  Oral Oral  Resp: '16  18 16  '$ Height: '5\' 2"'$  (1.575 m)     Weight: 73.347 kg (161 lb 11.2 oz)     SpO2: 100% 99% 97% 94%    Intake/Output Summary (Last 24 hours) at 08/24/15 1209 Last data filed at 08/24/15 0700  Gross per 24 hour  Intake 656.67 ml  Output   1275 ml  Net -618.33 ml  Filed Weights   08/23/15 1853  Weight: 73.347 kg (161 lb 11.2 oz)    Exam:  General exam: Pleasant elderly female lying comfortably propped up in bed. Respiratory system: Reduced breath sounds in the bases with occasional basal crackles. Rest of lung fields clear without wheezing or rhonchi.. No increased work of breathing. Cardiovascular system: S1 & S2 heard,  RRR. No JVD, murmurs, gallops, clicks. Trace bilateral ankle edema. Gastrointestinal system: Abdomen is nondistended, soft and nontender. Normal bowel sounds heard. Central nervous system: Alert and oriented. No focal neurological deficits. Extremities: Symmetric 5 x 5 power.   Data Reviewed: Basic Metabolic Panel:  Recent Labs Lab 08/20/15 1314 08/23/15 1344 08/24/15 0450  NA 142 138 136  K 5.6 Repeated and Verified* 3.8 3.4*  CL  --   --  103  CO2 '27 23 25  '$ GLUCOSE 121 188* 113*  BUN 30.3* 19.8 17  CREATININE 0.8 0.8 0.65  CALCIUM 9.3 8.9 8.5*   Liver Function Tests:  Recent Labs Lab 08/20/15 1314 08/23/15 1344 08/24/15 0450  AST 94* 42* 32  ALT 87* 58* 48  ALKPHOS 287* 222* 163*  BILITOT 0.77 0.86 0.8  PROT 6.0* 6.0* 5.4*  ALBUMIN 2.7* 2.6* 2.5*   No results for input(s): LIPASE, AMYLASE in the last 168 hours. No results for input(s): AMMONIA in the last 168 hours. CBC:  Recent Labs Lab 08/20/15 1314 08/23/15 1343 08/23/15 2320 08/24/15 0840  WBC 2.3* 0.2*  --  0.1*  NEUTROABS 1.9 0.0*  --   --   HGB 11.0* 11.6 10.2* 9.8*  HCT 34.0* 34.8 30.3* 27.9*  MCV 91.3 89.2  --  86.4  PLT 133* 57*  --  34*   Cardiac Enzymes:  Recent Labs Lab 08/23/15 2025 08/24/15 0748  TROPONINI <0.03 0.03   BNP (last 3 results) No results for input(s): PROBNP in the last 8760 hours. CBG: No results for input(s): GLUCAP in the last 168 hours.  Recent Results (from the past 240 hour(s))  C difficile quick scan w PCR reflex     Status: Abnormal   Collection Time: 08/23/15  3:35 PM  Result Value Ref Range Status   C Diff antigen POSITIVE (A) NEGATIVE Final   C Diff toxin POSITIVE (A) NEGATIVE Final   C Diff interpretation Positive for toxigenic C. difficile  Final    Comment: RESULT CALLED TO, READ BACK BY AND VERIFIED WITH: BACON,C NP AT 1511 ON 4.7.17 BY EPPERSON,S          Studies: Dg Chest Port 1 View  08/23/2015  CLINICAL DATA:  Chronic dyspnea. H/o COPD  and metastatic lung cancer. Former smoker. EXAM: PORTABLE CHEST 1 VIEW COMPARISON:  07/20/2015 FINDINGS: There is right greater than left hazy lung base opacity. This obscures the right hemidiaphragm. Remainder of the lungs show interstitial thickening. There is some hazy airspace opacity in the right mid lung. No pneumothorax. Cardiac silhouette is mostly obscured by the diaphragms. No gross mediastinal or hilar masses. Left anterior chest wall Port-A-Cath has its tip in the lower superior vena cava. Bony thorax is demineralized but grossly intact. IMPRESSION: 1. Right greater than left pleural effusions. Interstitial thickening and central right lung hazy airspace opacity. Findings consistent with pulmonary edema likely on the basis of mild congestive heart failure. No convincing pneumonia. Electronically Signed   By: Lajean Manes M.D.   On: 08/23/2015 20:25        Scheduled Meds: . antiseptic oral rinse  7 mL Mouth Rinse BID  .  ipratropium-albuterol  3 mL Nebulization TID  . losartan  50 mg Oral Daily  . multivitamin with minerals  1 tablet Oral Daily  . pantoprazole (PROTONIX) IV  40 mg Intravenous Q12H  . predniSONE  10 mg Oral Q breakfast  . vancomycin  125 mg Oral 4 times per day   Continuous Infusions:   Principal Problem:   C. difficile diarrhea Active Problems:   Essential hypertension   COPD with chronic bronchitis (HCC)   Small cell carcinoma (HCC)   Drug-induced neutropenia (HCC)   Oxygen dependent    Time spent: 40 minutes.    Vernell Leep, MD, FACP, FHM. [facility] Pager [phone]  If 7PM-7AM, please contact night-coverage [email or web address] Password [email or web address] 08/24/2015, 12:09 PM    LOS: 1 day

## 2015-08-25 ENCOUNTER — Inpatient Hospital Stay (HOSPITAL_COMMUNITY): Payer: Medicare Other

## 2015-08-25 DIAGNOSIS — J9611 Chronic respiratory failure with hypoxia: Secondary | ICD-10-CM

## 2015-08-25 LAB — CBC WITH DIFFERENTIAL/PLATELET
BASOS PCT: 0 %
Basophils Absolute: 0 10*3/uL (ref 0.0–0.1)
EOS PCT: 0 %
Eosinophils Absolute: 0 10*3/uL (ref 0.0–0.7)
HCT: 26.3 % — ABNORMAL LOW (ref 36.0–46.0)
Hemoglobin: 8.9 g/dL — ABNORMAL LOW (ref 12.0–15.0)
LYMPHS ABS: 0.3 10*3/uL — AB (ref 0.7–4.0)
Lymphocytes Relative: 56 %
MCH: 29.8 pg (ref 26.0–34.0)
MCHC: 33.8 g/dL (ref 30.0–36.0)
MCV: 88 fL (ref 78.0–100.0)
MONO ABS: 0.2 10*3/uL (ref 0.1–1.0)
Monocytes Relative: 28 %
Neutro Abs: 0.1 10*3/uL — ABNORMAL LOW (ref 1.7–7.7)
Neutrophils Relative %: 16 %
Platelets: 19 10*3/uL — CL (ref 150–400)
RBC: 2.99 MIL/uL — AB (ref 3.87–5.11)
RDW: 15.6 % — AB (ref 11.5–15.5)
WBC: 0.6 10*3/uL — AB (ref 4.0–10.5)

## 2015-08-25 LAB — ECHOCARDIOGRAM COMPLETE
HEIGHTINCHES: 62 in
Weight: 2587.2 oz

## 2015-08-25 LAB — BASIC METABOLIC PANEL
ANION GAP: 8 (ref 5–15)
BUN: 15 mg/dL (ref 6–20)
CALCIUM: 8.7 mg/dL — AB (ref 8.9–10.3)
CHLORIDE: 102 mmol/L (ref 101–111)
CO2: 27 mmol/L (ref 22–32)
Creatinine, Ser: 0.6 mg/dL (ref 0.44–1.00)
GFR calc Af Amer: >60 mL/min (ref >60)
GFR calc non Af Amer: >60 mL/min (ref >60)
GLUCOSE: 106 mg/dL — AB (ref 65–99)
POTASSIUM: 3.8 mmol/L (ref 3.5–5.1)
Sodium: 137 mmol/L (ref 135–145)

## 2015-08-25 MED ORDER — SIMETHICONE 80 MG PO CHEW
80.0000 mg | CHEWABLE_TABLET | Freq: Four times a day (QID) | ORAL | Status: DC | PRN
  Administered 2015-08-25 – 2015-08-29 (×11): 80 mg via ORAL
  Filled 2015-08-25 (×11): qty 1

## 2015-08-25 MED ORDER — ONDANSETRON HCL 4 MG PO TABS: 4.0000 mg | ORAL_TABLET | Freq: Three times a day (TID) | ORAL | Status: DC | PRN

## 2015-08-25 MED ORDER — FUROSEMIDE 10 MG/ML IJ SOLN
40.0000 mg | Freq: Every day | INTRAMUSCULAR | Status: DC
  Administered 2015-08-25 – 2015-08-28 (×4): 40 mg via INTRAVENOUS
  Filled 2015-08-25 (×4): qty 4

## 2015-08-25 MED ORDER — ALUM & MAG HYDROXIDE-SIMETH 200-200-20 MG/5ML PO SUSP
15.0000 mL | Freq: Four times a day (QID) | ORAL | Status: DC | PRN
  Administered 2015-08-25: 15 mL via ORAL
  Filled 2015-08-25: qty 30

## 2015-08-25 NOTE — Progress Notes (Signed)
  Echocardiogram 2D Echocardiogram has been performed.  Aggie Cosier 08/25/2015, 11:06 AM

## 2015-08-25 NOTE — Progress Notes (Signed)
PROGRESS NOTE    Meghan Castro  TSV:[id number]  DOB: 08-Jan-1929  DOA: 08/23/2015 PCP: [name], MD Outpatient Specialists: Oncology: Dr. Curt Bears.   Hospital course: 82 y.o. woman with a history of COPD on home oxygen at 2L [place], HTN, HLD, TIA, and recent diagnosis of extensive small cell lung cancer (mets to bone, liver, adrenal glands; incurable; started palliative chemotherapy with carboplatin and etoposide on March 28th; received Neulasta injection on April 1st) who was referred for direct admission from the oncology office for management of C diff diarrhea in the setting of profound neutropenia. Her condition deteriorated rapidly in the 24 hours prior to admission with complaints of progressive fatigue, decreased appetite, pain in her neck, chest, back, shoulders and then multiple diarrhea-greenish loose stools. Evaluation in oncologist's office significant for WBC 0.2, platelets 57, C. difficile PCR positive. Directly admitted to medical floor. Improving.   Assessment & Plan:   C. difficile diarrhea - Continue treatment with oral vancomycin and complete 14 days course. Add probiotics. Avoid unnecessary antibiotics. - Improving. No BM since night of admission.  Pancytopenia secondary to chemotherapy - As per discussion with oncology on 4/7, received Neulasta on 4/1 and was advised to hold off giving any more Neulasta and expect white cell to start improving in the next 2 or 3 days. No fevers. - No bleeding except for minimal blood on toilet paper. Monitor closely and transfuse if platelets <10 K or for active bleeding (discussed with Oncology on call 4/8).  - Transfuse PRBC for hemoglobin <7 g per DL. - WBC starting to rise. Hemoglobin 8.9. Platelets 19. Management as above.  Dyspnea - Likely multifactorial related to bilateral pleural effusions and possible pulmonary edema - Has been getting daily Lasix since admission with some improvement in dyspnea and chest  x-ray. Continue IV Lasix. Follow 2-D echo results.  - Finally able to place Foley catheter on 4/8. -778 mL.  Possible acute on chronic diastolic CHF - Management as above.  COPD - Stable without clinical features suggestive of bronchospasm.  Chronic respiratory failure, on home oxygen 2 L/m.  Hypokalemia - Replaced.  Lung cancer - Outpatient follow-up with oncology.  TIA - Aspirin on hold secondary to severe thrombocytopenia.  Atypical chest pain - No chest pain currently. EKG 4/8: Independently reviewed. Sinus rhythm without acute findings. QTC 457 ms. - Troponins 2 negative. - Resolved.   DVT prophylaxis: SCD's Code Status: Full Family Communication: None at bedside Disposition Plan: DC home when medically stable   Consultants:  None  Procedures:  Foley catheter 4/8 >  Antimicrobials:  PO Vancomycin 4/8>   Subjective: Continues to feel better. Slept well last night. No acute pain issues. No BM since night of admission. Dyspnea improved.  Objective: Filed Vitals:   08/24/15 1927 08/24/15 2057 08/25/15 0605 08/25/15 0830  BP:  131/59 141/63   Pulse:  102 83   Temp:  98.1 F (36.7 C) 98 F (36.7 C)   TempSrc:  Oral Oral   Resp:  22 16   Height:      Weight:      SpO2: 98% 96% 98% 96%    Intake/Output Summary (Last 24 hours) at 08/25/15 1113 Last data filed at 08/25/15 0605  Gross per 24 hour  Intake    600 ml  Output    950 ml  Net   -350 ml   Filed Weights   08/23/15 1853  Weight: 73.347 kg (161 lb 11.2 oz)    Exam:  General exam: Pleasant elderly female lying comfortably propped up in bed. Respiratory system: Improved breath sounds. Slightly diminished in bases with occasional basal crackles. Rest of lung fields clear without wheezing or rhonchi.. No increased work of breathing. Cardiovascular system: S1 & S2 heard, RRR. No JVD, murmurs, gallops, clicks. Trace bilateral ankle edema. Gastrointestinal system: Abdomen is nondistended, soft  and nontender. Normal bowel sounds heard. Foley catheter +.  Central nervous system: Alert and oriented. No focal neurological deficits. Extremities: Symmetric 5 x 5 power.   Data Reviewed: Basic Metabolic Panel:  Recent Labs Lab 08/20/15 1314 08/23/15 1344 08/24/15 0450 08/25/15 0500  NA 142 138 136 137  K 5.6 Repeated and Verified* 3.8 3.4* 3.8  CL  --   --  103 102  CO2 '27 23 25 27  '$ GLUCOSE 121 188* 113* 106*  BUN 30.3* 19.'8 17 15  '$ CREATININE 0.8 0.8 0.65 0.60  CALCIUM 9.3 8.9 8.5* 8.7*   Liver Function Tests:  Recent Labs Lab 08/20/15 1314 08/23/15 1344 08/24/15 0450  AST 94* 42* 32  ALT 87* 58* 48  ALKPHOS 287* 222* 163*  BILITOT 0.77 0.86 0.8  PROT 6.0* 6.0* 5.4*  ALBUMIN 2.7* 2.6* 2.5*   No results for input(s): LIPASE, AMYLASE in the last 168 hours. No results for input(s): AMMONIA in the last 168 hours. CBC:  Recent Labs Lab 08/20/15 1314 08/23/15 1343 08/23/15 2320 08/24/15 0840 08/25/15 0500  WBC 2.3* 0.2*  --  0.1* 0.6*  NEUTROABS 1.9 0.0*  --   --  0.1*  HGB 11.0* 11.6 10.2* 9.8* 8.9*  HCT 34.0* 34.8 30.3* 27.9* 26.3*  MCV 91.3 89.2  --  86.4 88.0  PLT 133* 57*  --  34* 19*   Cardiac Enzymes:  Recent Labs Lab 08/23/15 2025 08/24/15 0748 08/24/15 1450  TROPONINI <0.03 0.03 <0.03   BNP (last 3 results) No results for input(s): PROBNP in the last 8760 hours. CBG: No results for input(s): GLUCAP in the last 168 hours.  Recent Results (from the past 240 hour(s))  C difficile quick scan w PCR reflex     Status: Abnormal   Collection Time: 08/23/15  3:35 PM  Result Value Ref Range Status   C Diff antigen POSITIVE (A) NEGATIVE Final   C Diff toxin POSITIVE (A) NEGATIVE Final   C Diff interpretation Positive for toxigenic C. difficile  Final    Comment: RESULT CALLED TO, READ BACK BY AND VERIFIED WITH: BACON,C NP AT 1511 ON 4.7.17 BY EPPERSON,S          Studies: Dg Chest 2 View  08/25/2015  CLINICAL DATA:  Followup CHF and  bilateral pleural effusions. Current history of right middle lobe lung cancer. EXAM: CHEST  2 VIEW COMPARISON:  08/23/2015 and earlier, including PET-CT 08/12/2015 and CT chest 07/20/2015. FINDINGS: Cardiac silhouette upper normal in size slightly enlarged, unchanged. Chronic elevation of the left hemidiaphragm, unchanged. Right middle lobe mass extending into the right hilum less conspicuous than on the prior PET-CT and chest CT. No significant change in the mild interstitial pulmonary edema and the moderately large right pleural effusion. Improved aeration in the right middle lobe and right lower lobe with moderate passive atelectasis persisting. No new pulmonary parenchymal abnormalities. Left jugular Port-A-Cath tip remains in the mid SVC. Exaggeration of the usual thoracic kyphosis as noted previously. IMPRESSION: 1. Stable mild diffuse interstitial pulmonary edema and stable moderate size right pleural effusion. 2. Improved aeration in the right middle lobe and right lower lobe, though  moderate passive atelectasis persists. 3. No new abnormalities. Electronically Signed   By: Evangeline Dakin M.D.   On: 08/25/2015 10:36   Dg Chest Port 1 View  08/23/2015  CLINICAL DATA:  Chronic dyspnea. H/o COPD and metastatic lung cancer. Former smoker. EXAM: PORTABLE CHEST 1 VIEW COMPARISON:  07/20/2015 FINDINGS: There is right greater than left hazy lung base opacity. This obscures the right hemidiaphragm. Remainder of the lungs show interstitial thickening. There is some hazy airspace opacity in the right mid lung. No pneumothorax. Cardiac silhouette is mostly obscured by the diaphragms. No gross mediastinal or hilar masses. Left anterior chest wall Port-A-Cath has its tip in the lower superior vena cava. Bony thorax is demineralized but grossly intact. IMPRESSION: 1. Right greater than left pleural effusions. Interstitial thickening and central right lung hazy airspace opacity. Findings consistent with pulmonary edema  likely on the basis of mild congestive heart failure. No convincing pneumonia. Electronically Signed   By: Lajean Manes M.D.   On: 08/23/2015 20:25        Scheduled Meds: . antiseptic oral rinse  7 mL Mouth Rinse BID  . ipratropium-albuterol  3 mL Nebulization TID  . losartan  50 mg Oral Daily  . multivitamin with minerals  1 tablet Oral Daily  . pantoprazole (PROTONIX) IV  40 mg Intravenous Q12H  . predniSONE  10 mg Oral Q breakfast  . saccharomyces boulardii  250 mg Oral BID  . vancomycin  125 mg Oral 4 times per day   Continuous Infusions:   Principal Problem:   C. difficile diarrhea Active Problems:   Essential hypertension   COPD with chronic bronchitis (HCC)   Small cell carcinoma (HCC)   Drug-induced neutropenia (HCC)   Oxygen dependent    Time spent: 20 minutes.    Vernell Leep, MD, FACP, FHM. [facility] Pager [phone]  If 7PM-7AM, please contact night-coverage [email or web address] Password [email or web address] 08/25/2015, 11:13 AM    LOS: 2 days

## 2015-08-25 NOTE — Progress Notes (Signed)
CRITICAL VALUE ALERT  Critical value received:  Platelets 19  Date of notification:  08/25/14  Time of notification:  0608  Critical value read back:Yes.    Nurse who received alert:  Hortencia Conradi RN  MD notified (1st page):  Rogue Bussing NP  Time of first page:  [phone]  MD notified (2nd page):  Time of second page:  Responding MD:  Rogue Bussing NP  Time MD responded:

## 2015-08-25 NOTE — Evaluation (Signed)
Physical Therapy Evaluation Patient Details Name: Meghan Castro MRN: [id number] DOB: 1929-04-26 Today's Date: 08/25/2015   History of Present Illness  Patient presents to the ED 08/23/15 with diarrhea,  SOB. Has hiistory of stage IV pulmonary lung cancer,    Clinical Impression  Ms. Langlais is  Mobilizing well, fatigues easily. Patient lives with sisters.  Pt admitted with above diagnosis. Pt currently with functional limitations due to the deficits listed below (see PT Problem List).  Pt will benefit from skilled PT to increase their independence and safety with mobility to allow discharge to home.      Follow Up Recommendations Home health PT;Supervision/Assistance - 24 hour    Equipment Recommendations  None recommended by PT    Recommendations for Other Services       Precautions / Restrictions Precautions Precautions: Fall Precaution Comments: Porta cath, on home O2      Mobility  Bed Mobility Overal bed mobility: Modified Independent                Transfers Overall transfer level: Needs assistance Equipment used: Rolling walker (2 wheeled) Transfers: Sit to/from Stand Sit to Stand: Supervision            Ambulation/Gait Ambulation/Gait assistance: Min guard Ambulation Distance (Feet): 15 Feet Assistive device: Rolling walker (2 wheeled) Gait Pattern/deviations: Step-through pattern     General Gait Details: sats on RA after washed up and ambulated 90%, HR 117/ after back on 2 liters+96%, HR 107  Stairs            Wheelchair Mobility    Modified Rankin (Stroke Patients Only)       Balance Overall balance assessment: Needs assistance Sitting-balance support: Feet supported;No upper extremity supported Sitting balance-Leahy Scale: Good     Standing balance support: During functional activity;Bilateral upper extremity supported Standing balance-Leahy Scale: Fair                               Pertinent Vitals/Pain Pain  Assessment: No/denies pain    Home Living Family/patient expects to be discharged to:: Private residence Living Arrangements: Other relatives Available Help at Discharge: Family;Available 24 hours/day Type of Home: House Home Access: Stairs to enter   [employer] of Steps: 2 Home Layout: One level Home Equipment: Cane - single point;Walker - 2 wheels      Prior Function Level of Independence: Needs assistance   Gait / Transfers Assistance Needed: mod . I with RW           [place] Dominance        Extremity/Trunk Assessment   Upper Extremity Assessment: Generalized weakness           Lower Extremity Assessment: Generalized weakness      Cervical / Trunk Assessment: Kyphotic  Communication   Communication: No difficulties  Cognition Arousal/Alertness: Awake/alert Behavior During Therapy: WFL for tasks assessed/performed Overall Cognitive Status: Within Functional Limits for tasks assessed                      General Comments      Exercises        Assessment/Plan    PT Assessment Patient needs continued PT services  PT Diagnosis Difficulty walking;Generalized weakness   PT Problem List Decreased strength;Decreased activity tolerance;Decreased mobility  PT Treatment Interventions DME instruction;Gait training;Functional mobility training;Therapeutic activities;Therapeutic exercise;Patient/family education   PT Goals (Current goals can be found in the Care Plan  section) Acute Rehab PT Goals Patient Stated Goal: to go home PT Goal Formulation: With patient Time For Goal Achievement: 09/08/15 Potential to Achieve Goals: Good    Frequency Min 3X/week   Barriers to discharge        Co-evaluation               End of Session   Activity Tolerance: Patient limited by fatigue Patient left: in bed;with call bell/phone within reach;with bed alarm set Nurse Communication: Mobility status         Time: 8377-9396 PT Time  Calculation (min) (ACUTE ONLY): 16 min   Charges:   PT Evaluation $PT Eval Low Complexity: 1 Procedure     PT G CodesClaretha Cooper 08/25/2015, 10:15 AM

## 2015-08-25 NOTE — Plan of Care (Signed)
Problem: Physical Regulation: Goal: Will remain free from infection Outcome: Progressing Episodes of c. Diff diarrhea decreasing.

## 2015-08-26 ENCOUNTER — Encounter: Payer: Self-pay | Admitting: Nurse Practitioner

## 2015-08-26 DIAGNOSIS — E876 Hypokalemia: Secondary | ICD-10-CM

## 2015-08-26 DIAGNOSIS — E860 Dehydration: Secondary | ICD-10-CM | POA: Insufficient documentation

## 2015-08-26 DIAGNOSIS — D701 Agranulocytosis secondary to cancer chemotherapy: Secondary | ICD-10-CM | POA: Insufficient documentation

## 2015-08-26 DIAGNOSIS — T451X5A Adverse effect of antineoplastic and immunosuppressive drugs, initial encounter: Secondary | ICD-10-CM

## 2015-08-26 DIAGNOSIS — E46 Unspecified protein-calorie malnutrition: Secondary | ICD-10-CM | POA: Insufficient documentation

## 2015-08-26 LAB — CBC WITH DIFFERENTIAL/PLATELET
BASOS ABS: 0 10*3/uL (ref 0.0–0.1)
BASOS PCT: 0 %
EOS ABS: 0 10*3/uL (ref 0.0–0.7)
Eosinophils Relative: 0 %
HCT: 27.4 % — ABNORMAL LOW (ref 36.0–46.0)
Hemoglobin: 9.4 g/dL — ABNORMAL LOW (ref 12.0–15.0)
Lymphocytes Relative: 27 %
Lymphs Abs: 0.5 10*3/uL — ABNORMAL LOW (ref 0.7–4.0)
MCH: 30 pg (ref 26.0–34.0)
MCHC: 34.3 g/dL (ref 30.0–36.0)
MCV: 87.5 fL (ref 78.0–100.0)
Monocytes Absolute: 0.6 10*3/uL (ref 0.1–1.0)
Monocytes Relative: 36 %
NEUTROS PCT: 37 %
Neutro Abs: 0.7 10*3/uL — ABNORMAL LOW (ref 1.7–7.7)
PLATELETS: 13 10*3/uL — AB (ref 150–400)
RBC: 3.13 MIL/uL — AB (ref 3.87–5.11)
RDW: 15.2 % (ref 11.5–15.5)
WBC MORPHOLOGY: INCREASED
WBC: 1.8 10*3/uL — AB (ref 4.0–10.5)

## 2015-08-26 LAB — BASIC METABOLIC PANEL
Anion gap: 8 (ref 5–15)
BUN: 14 mg/dL (ref 6–20)
CO2: 28 mmol/L (ref 22–32)
Calcium: 8.8 mg/dL — ABNORMAL LOW (ref 8.9–10.3)
Chloride: 99 mmol/L — ABNORMAL LOW (ref 101–111)
Creatinine, Ser: 0.62 mg/dL (ref 0.44–1.00)
GFR calc Af Amer: >60 mL/min (ref >60)
Glucose, Bld: 101 mg/dL — ABNORMAL HIGH (ref 65–99)
POTASSIUM: 3.4 mmol/L — AB (ref 3.5–5.1)
SODIUM: 135 mmol/L (ref 135–145)

## 2015-08-26 LAB — CBC
HEMATOCRIT: 26.2 % — AB (ref 36.0–46.0)
HEMOGLOBIN: 9.1 g/dL — AB (ref 12.0–15.0)
MCH: 29.6 pg (ref 26.0–34.0)
MCHC: 34.7 g/dL (ref 30.0–36.0)
MCV: 85.3 fL (ref 78.0–100.0)
Platelets: 74 10*3/uL — ABNORMAL LOW (ref 150–400)
RBC: 3.07 MIL/uL — ABNORMAL LOW (ref 3.87–5.11)
RDW: 14.9 % (ref 11.5–15.5)
WBC: 4.3 10*3/uL (ref 4.0–10.5)

## 2015-08-26 LAB — OCCULT BLOOD X 1 CARD TO LAB, STOOL: Fecal Occult Bld: NEGATIVE

## 2015-08-26 LAB — TYPE AND SCREEN
ABO/RH(D): O POS
ANTIBODY SCREEN: NEGATIVE

## 2015-08-26 LAB — BRAIN NATRIURETIC PEPTIDE: B NATRIURETIC PEPTIDE 5: 271.6 pg/mL — AB (ref 0.0–100.0)

## 2015-08-26 LAB — ABO/RH: ABO/RH(D): O POS

## 2015-08-26 MED ORDER — POTASSIUM CHLORIDE CRYS ER 20 MEQ PO TBCR
40.0000 meq | EXTENDED_RELEASE_TABLET | Freq: Once | ORAL | Status: AC
  Administered 2015-08-26: 40 meq via ORAL
  Filled 2015-08-26: qty 2

## 2015-08-26 MED ORDER — SODIUM CHLORIDE 0.9 % IV SOLN
Freq: Once | INTRAVENOUS | Status: AC
  Administered 2015-08-26: 14:00:00 via INTRAVENOUS

## 2015-08-26 NOTE — Assessment & Plan Note (Signed)
Patient states that she has developed severe, liquid diarrhea with a very foul odor within the past 48 hours.  She has actually started wearing adult diapers; but states that the liquid stool is so profuse that is leaking out the sides of the diaper.  Patient actually had to change her close 3 different times while at the [facility] today.  Patient denies any abdominal discomfort whatsoever.  Stool tested positive for C. difficile today.  Due to patient's dehydration, neutropenia, and new diagnosis of C. difficile-patient will be directly admitted to the hospital for further evaluation and management.  Brief history.  Report were called to the hospitalist; prior to the patient being transported to the floor via wheelchair.  Per the cancer Center nurse.

## 2015-08-26 NOTE — Progress Notes (Signed)
Addendum  Discussed with Dr. Curt Bears, Oncology re Platelets 13K. He advised transfusing 1 unit Platelets. Patient agreeable to this. Discussed with patient's sister Ms. Dare Tinnin, updated care and answered questions.  Vernell Leep, MD, FACP, FHM. [facility] Pager [phone]  If 7PM-7AM, please contact night-coverage [email or web address] Password [email or web address] 08/26/2015, 1:33 PM

## 2015-08-26 NOTE — Assessment & Plan Note (Signed)
Patient presented to the [facility] today with generalized aches, increased fatigue and weakness, and severe diarrhea.  She denies any recent fevers or chills.  Blood counts obtained today reveal a WBC of 0.2, ANC 0.0, and lately count 57.  Reviewed all neutropenia guidelines with both patient and her sisters today.  Due to patient's newly diagnosed C. difficile and therapy-induced neutropenia-patient will be corrected.  Admitted to the floor for further evaluation and management.   Note: Patient should be considered a full code; since there are no advanced directives in her chart.

## 2015-08-26 NOTE — Progress Notes (Signed)
SYMPTOM MANAGEMENT CLINIC    Chief Complaint: Fatigue/weakness, diarrhea, dehydration.   HPI:  Meghan Castro 82 y.o. female diagnosed with small cell lung cancer; with liver, bone, and adrenal gland metastasis.  Currently undergoing carboplatin/etoposide/Neulasta chemotherapy regimen.   Patient presented to the [facility] today with generalized aches, increased fatigue and weakness, and severe diarrhea.  She denies any recent fevers or chills.  Blood counts obtained today reveal a WBC of 0.2, ANC 0.0, and lately count 57.  Reviewed all neutropenia guidelines with both patient and her sisters today.  Due to patient's newly diagnosed C. difficile and therapy-induced neutropenia-patient will be corrected.  Admitted to the floor for further evaluation and management.   Note: Patient should be considered a full code; since there are no advanced directives in her chart.    Small cell carcinoma (HCC)    Initial Diagnosis Small cell carcinoma ([place])    Review of Systems  Constitutional: Positive for weight loss and malaise/fatigue. Negative for fever and chills.  Gastrointestinal: Positive for diarrhea. Negative for nausea, vomiting, abdominal pain, constipation, blood in stool and melena.  Musculoskeletal: Positive for myalgias.  Neurological: Positive for weakness.  All other systems reviewed and are negative.   Past Medical History  Diagnosis Date  . Chronic airway obstruction, not elsewhere classified   . Unspecified sinusitis (chronic)   . Diaphragmatic hernia without mention of obstruction or gangrene   . Macular degeneration (senile) of retina, unspecified   . Other and unspecified hyperlipidemia   . Unspecified essential hypertension   . Unspecified disorder resulting from impaired renal function   . Mediastinal adenopathy   . [facility])     metastatic lung cancer  . TIA (transient ischemic attack)     Past Surgical History  Procedure Laterality Date  . Arm  fracture    . Cholecystectomy    . Total abdominal hysterectomy    . Bilateral salpingoophorectomy    . Incisional hernia repair      abdominal  . Appendectomy    . Cataract extraction    . Endobronchial ultrasound Bilateral 07/24/2015    Procedure: ENDOBRONCHIAL ULTRASOUND;  Surgeon: [name], MD;  Location: [facility];  Service: Cardiopulmonary;  Laterality: Bilateral;    has Hyperlipemia; MACULAR DEGENERATION, BILATERAL; Essential hypertension; Seasonal and perennial allergic rhinitis; SINUSITIS, CHRONIC; COPD with chronic bronchitis ([place]); HIATAL HERNIA; RENAL INSUFFICIENCY; Heart murmur, systolic; Interstitial lung disease ([place]); Hypoxia; COPD exacerbation ([place]); Lung mass; Mediastinal lymphadenopathy; Small cell carcinoma ([place]); Diarrhea; Respiratory failure, acute ([place]); Small cell carcinoma of right lung (HCC); C. difficile diarrhea; Drug-induced neutropenia ([place]); Oxygen dependent; Hypoalbuminemia due to protein-calorie malnutrition ([place]); Dehydration; and Chemotherapy induced neutropenia (HCC) on her problem list.    is allergic to other; penicillins; pravastatin sodium; quinolones; and moxifloxacin.    Medication List       This list is accurate as of: 08/23/15  6:06 PM.  Always use your most recent med list.               acetaminophen 500 MG tablet  Commonly known as:  TYLENOL  Take 500 mg by mouth every 6 (six) hours as needed for mild pain or moderate pain. Reported on 08/13/2015     ALPRAZolam 0.25 MG tablet  Commonly known as:  XANAX  Take 1 tablet (0.25 mg total) by mouth 3 (three) times daily as needed for anxiety.     aspirin 325 MG EC tablet  Take 325 mg by mouth daily.  Fish Oil 1000 MG Caps  Take 1 capsule by mouth daily.     Ginger Root 500 MG Caps  Take 2 capsules by mouth daily. Reported on 08/13/2015     lidocaine-prilocaine cream  Commonly known as:  EMLA  Apply 1 application topically as needed.     loratadine 10 MG tablet  Commonly  known as:  CLARITIN  Take 10 mg by mouth daily as needed (pain).     losartan 50 MG tablet  Commonly known as:  COZAAR  Take 50 mg by mouth daily.     MELATONIN PO  Take 10 mg by mouth daily.     multivitamin with minerals Tabs tablet  Take 1 tablet by mouth daily.     predniSONE 10 MG tablet  Commonly known as:  DELTASONE  Take 1 tablet (10 mg total) by mouth daily with breakfast.     PROAIR HFA 108 (90 Base) MCG/ACT inhaler  Generic drug:  albuterol  INHALE 2 PUFFS INTO THE LUNGS EVERY 4 (FOUR) HOURS AS NEEDED FOR WHEEZING OR SHORTNESS OF BREATH.     prochlorperazine 10 MG tablet  Commonly known as:  COMPAZINE  Take 1 tablet (10 mg total) by mouth every 6 (six) hours as needed for nausea or vomiting.     Tiotropium Bromide Monohydrate 2.5 MCG/ACT Aers  Commonly known as:  SPIRIVA RESPIMAT  Inhale 2 puffs into the lungs daily.     traMADol 50 MG tablet  Commonly known as:  ULTRAM  Take 1 tablet (50 mg total) by mouth every 6 (six) hours as needed.         PHYSICAL EXAMINATION  Oncology Vitals 08/26/2015 08/26/2015  Height - -  Weight - -  Weight (lbs) - -  BMI (kg/m2) - -  Temp - 97.6  Pulse - 86  Resp - 16  SpO2 98 99  BSA (m2) - -   BP Readings from Last 2 Encounters:  08/26/15 130/69  08/23/15 133/76    Physical Exam  Constitutional: She is oriented to person, place, and time. Vital signs are normal. She appears dehydrated. She appears unhealthy.  HENT:  Head: Normocephalic and atraumatic.  Mouth/Throat: Oropharynx is clear and moist.  Eyes: Conjunctivae and EOM are normal. Pupils are equal, round, and reactive to light. Right eye exhibits no discharge. Left eye exhibits no discharge. No scleral icterus.  Neck: Normal range of motion. Neck supple. No JVD present. No tracheal deviation present. No thyromegaly present.  Cardiovascular: Normal rate, regular rhythm, normal heart sounds and intact distal pulses.   Pulmonary/Chest: Effort normal and breath  sounds normal. No respiratory distress. She has no wheezes. She has no rales. She exhibits no tenderness.  Abdominal: Soft. Bowel sounds are normal. She exhibits no distension and no mass. There is no tenderness. There is no rebound and no guarding.  Musculoskeletal: Normal range of motion. She exhibits no edema or tenderness.  Lymphadenopathy:    She has no cervical adenopathy.  Neurological: She is alert and oriented to person, place, and time.  Skin: Skin is warm and dry. No rash noted. No erythema. There is pallor.  Psychiatric: Affect normal.  Nursing note and vitals reviewed.   LABORATORY DATA:. Admission on 08/23/2015  Component Date Value Ref Range Status  . Prothrombin Time 08/23/2015 15.9* 11.6 - 15.2 seconds Final  . INR 08/23/2015 1.31  0.00 - 1.49 Final  . Color, Urine 08/23/2015 AMBER* YELLOW Final   BIOCHEMICALS MAY BE AFFECTED BY COLOR  .  APPearance 08/23/2015 CLEAR  CLEAR Final  . Specific Gravity, Urine 08/23/2015 1.026  1.005 - 1.030 Final  . pH 08/23/2015 6.0  5.0 - 8.0 Final  . Glucose, UA 08/23/2015 100* NEGATIVE mg/dL Final  . Hgb urine dipstick 08/23/2015 NEGATIVE  NEGATIVE Final  . Bilirubin Urine 08/23/2015 NEGATIVE  NEGATIVE Final  . Ketones, ur 08/23/2015 NEGATIVE  NEGATIVE mg/dL Final  . Protein, ur 08/23/2015 100* NEGATIVE mg/dL Final  . Nitrite 08/23/2015 NEGATIVE  NEGATIVE Final  . Leukocytes, UA 08/23/2015 NEGATIVE  NEGATIVE Final  . Troponin I 08/23/2015 <0.03  <0.031 ng/mL Final   Comment:        NO INDICATION OF MYOCARDIAL INJURY.   . Troponin I 08/24/2015 <0.03  <0.031 ng/mL Final   Comment:        NO INDICATION OF MYOCARDIAL INJURY.   . Troponin I 08/24/2015 0.03  <0.031 ng/mL Final   Comment:        NO INDICATION OF MYOCARDIAL INJURY.   . B Natriuretic Peptide 08/23/2015 202.6* 0.0 - 100.0 pg/mL Final  . Sodium 08/24/2015 136  135 - 145 mmol/L Final  . Potassium 08/24/2015 3.4* 3.5 - 5.1 mmol/L Final  . Chloride 08/24/2015 103  101  - 111 mmol/L Final  . CO2 08/24/2015 25  22 - 32 mmol/L Final  . Glucose, Bld 08/24/2015 113* 65 - 99 mg/dL Final  . BUN 08/24/2015 17  6 - 20 mg/dL Final  . Creatinine, Ser 08/24/2015 0.65  0.44 - 1.00 mg/dL Final  . Calcium 08/24/2015 8.5* 8.9 - 10.3 mg/dL Final  . Total Protein 08/24/2015 5.4* 6.5 - 8.1 g/dL Final  . Albumin 08/24/2015 2.5* 3.5 - 5.0 g/dL Final  . AST 08/24/2015 32  15 - 41 U/L Final  . ALT 08/24/2015 48  14 - 54 U/L Final  . Alkaline Phosphatase 08/24/2015 163* 38 - 126 U/L Final  . Total Bilirubin 08/24/2015 0.8  0.3 - 1.2 mg/dL Final  . GFR calc non Af Amer 08/24/2015 >60  >60 mL/min Final  . GFR calc Af Amer 08/24/2015 >60  >60 mL/min Final   Comment: (NOTE) The eGFR has been calculated using the CKD EPI equation. This calculation has not been validated in all clinical situations. eGFR's persistently <60 mL/min signify possible Chronic Kidney Disease.   . Anion gap 08/24/2015 8  5 - 15 Final  . Hemoglobin 08/23/2015 10.2* 12.0 - 15.0 g/dL Final  . HCT 08/23/2015 30.3* 36.0 - 46.0 % Final  . Squamous Epithelial / LPF 08/23/2015 6-30* NONE SEEN Final  . WBC, UA 08/23/2015 0-5  0 - 5 WBC/hpf Final  . RBC / HPF 08/23/2015 0-5  0 - 5 RBC/hpf Final  . Bacteria, UA 08/23/2015 RARE* NONE SEEN Final  . Casts 08/23/2015 GRANULAR CAST* NEGATIVE Final  . WBC 08/24/2015 0.1* 4.0 - 10.5 K/uL Final   Comment: RESULT REPEATED AND VERIFIED HUGHEY,C AT 0925 ON [id number] BY HOOKER,B   . RBC 08/24/2015 3.23* 3.87 - 5.11 MIL/uL Final  . Hemoglobin 08/24/2015 9.8* 12.0 - 15.0 g/dL Final  . HCT 08/24/2015 27.9* 36.0 - 46.0 % Final  . MCV 08/24/2015 86.4  78.0 - 100.0 fL Final  . MCH 08/24/2015 30.3  26.0 - 34.0 pg Final  . MCHC 08/24/2015 35.1  30.0 - 36.0 g/dL Final  . RDW 08/24/2015 15.3  11.5 - 15.5 % Final  . Platelets 08/24/2015 34* 150 - 400 K/uL Final   Comment: RESULT REPEATED AND VERIFIED SPECIMEN CHECKED FOR CLOTS PLATELET  COUNT CONFIRMED BY SMEAR   . Smear Review  08/24/2015 MORPHOLOGY UNREMARKABLE   Final   Comment: TOO FEW TO COUNT, SMEAR AVAILABLE FOR REVIEW RARE LYMPHOCYTE SEEN ON SCAN   . nRBC 08/24/2015 0  0 /100 WBC Final  . Weight 08/25/2015 2587.2   Final  . Height 08/25/2015 62   Final  . BP 08/25/2015 141/63   Final  . WBC 08/25/2015 0.6* 4.0 - 10.5 K/uL Final   Comment: REPEATED TO VERIFY CRITICAL VALUE NOTED.  VALUE IS CONSISTENT WITH PREVIOUSLY REPORTED AND CALLED VALUE.   Marland Kitchen RBC 08/25/2015 2.99* 3.87 - 5.11 MIL/uL Final  . Hemoglobin 08/25/2015 8.9* 12.0 - 15.0 g/dL Final  . HCT 08/25/2015 26.3* 36.0 - 46.0 % Final  . MCV 08/25/2015 88.0  78.0 - 100.0 fL Final  . MCH 08/25/2015 29.8  26.0 - 34.0 pg Final  . MCHC 08/25/2015 33.8  30.0 - 36.0 g/dL Final  . RDW 08/25/2015 15.6* 11.5 - 15.5 % Final  . Platelets 08/25/2015 19* 150 - 400 K/uL Final   Comment: SPECIMEN CHECKED FOR CLOTS REPEATED TO VERIFY PLATELET COUNT CONFIRMED BY SMEAR CRITICAL RESULT CALLED TO, READ BACK BY AND VERIFIED WITH: S AMBURN AT 3299 ON 04.09.2017 BY NBROOKS   . Neutrophils Relative % 08/25/2015 16   Final  . Lymphocytes Relative 08/25/2015 56   Final  . Monocytes Relative 08/25/2015 28   Final  . Eosinophils Relative 08/25/2015 0   Final  . Basophils Relative 08/25/2015 0   Final  . Neutro Abs 08/25/2015 0.1* 1.7 - 7.7 K/uL Final  . Lymphs Abs 08/25/2015 0.3* 0.7 - 4.0 K/uL Final  . Monocytes Absolute 08/25/2015 0.2  0.1 - 1.0 K/uL Final  . Eosinophils Absolute 08/25/2015 0.0  0.0 - 0.7 K/uL Final  . Basophils Absolute 08/25/2015 0.0  0.0 - 0.1 K/uL Final  . WBC Morphology 08/25/2015 TOXIC GRANULATION   Final   DOHLE BODIES  . Sodium 08/25/2015 137  135 - 145 mmol/L Final  . Potassium 08/25/2015 3.8  3.5 - 5.1 mmol/L Final  . Chloride 08/25/2015 102  101 - 111 mmol/L Final  . CO2 08/25/2015 27  22 - 32 mmol/L Final  . Glucose, Bld 08/25/2015 106* 65 - 99 mg/dL Final  . BUN 08/25/2015 15  6 - 20 mg/dL Final  . Creatinine, Ser 08/25/2015 0.60   0.44 - 1.00 mg/dL Final  . Calcium 08/25/2015 8.7* 8.9 - 10.3 mg/dL Final  . GFR calc non Af Amer 08/25/2015 >60  >60 mL/min Final  . GFR calc Af Amer 08/25/2015 >60  >60 mL/min Final   Comment: (NOTE) The eGFR has been calculated using the CKD EPI equation. This calculation has not been validated in all clinical situations. eGFR's persistently <60 mL/min signify possible Chronic Kidney Disease.   . Anion gap 08/25/2015 8  5 - 15 Final  . WBC 08/26/2015 1.8* 4.0 - 10.5 K/uL Final  . RBC 08/26/2015 3.13* 3.87 - 5.11 MIL/uL Final  . Hemoglobin 08/26/2015 9.4* 12.0 - 15.0 g/dL Final  . HCT 08/26/2015 27.4* 36.0 - 46.0 % Final  . MCV 08/26/2015 87.5  78.0 - 100.0 fL Final  . MCH 08/26/2015 30.0  26.0 - 34.0 pg Final  . MCHC 08/26/2015 34.3  30.0 - 36.0 g/dL Final  . RDW 08/26/2015 15.2  11.5 - 15.5 % Final  . Platelets 08/26/2015 13* 150 - 400 K/uL Final   Comment: SPECIMEN CHECKED FOR CLOTS REPEATED TO VERIFY CRITICAL VALUE NOTED.  VALUE  IS CONSISTENT WITH PREVIOUSLY REPORTED AND CALLED VALUE.   Marland Kitchen Neutrophils Relative % 08/26/2015 37   Final  . Lymphocytes Relative 08/26/2015 27   Final  . Monocytes Relative 08/26/2015 36   Final  . Eosinophils Relative 08/26/2015 0   Final  . Basophils Relative 08/26/2015 0   Final  . Neutro Abs 08/26/2015 0.7* 1.7 - 7.7 K/uL Final  . Lymphs Abs 08/26/2015 0.5* 0.7 - 4.0 K/uL Final  . Monocytes Absolute 08/26/2015 0.6  0.1 - 1.0 K/uL Final  . Eosinophils Absolute 08/26/2015 0.0  0.0 - 0.7 K/uL Final  . Basophils Absolute 08/26/2015 0.0  0.0 - 0.1 K/uL Final  . WBC Morphology 08/26/2015 INCREASED BANDS (>20% BANDS)   Final  . Sodium 08/26/2015 135  135 - 145 mmol/L Final  . Potassium 08/26/2015 3.4* 3.5 - 5.1 mmol/L Final  . Chloride 08/26/2015 99* 101 - 111 mmol/L Final  . CO2 08/26/2015 28  22 - 32 mmol/L Final  . Glucose, Bld 08/26/2015 101* 65 - 99 mg/dL Final  . BUN 08/26/2015 14  6 - 20 mg/dL Final  . Creatinine, Ser 08/26/2015 0.62  0.44  - 1.00 mg/dL Final  . Calcium 08/26/2015 8.8* 8.9 - 10.3 mg/dL Final  . GFR calc non Af Amer 08/26/2015 >60  >60 mL/min Final  . GFR calc Af Amer 08/26/2015 >60  >60 mL/min Final   Comment: (NOTE) The eGFR has been calculated using the CKD EPI equation. This calculation has not been validated in all clinical situations. eGFR's persistently <60 mL/min signify possible Chronic Kidney Disease.   . Anion gap 08/26/2015 8  5 - 15 Final  . B Natriuretic Peptide 08/26/2015 271.6* 0.0 - 100.0 pg/mL Final  Hospital Outpatient Visit on 08/23/2015  Component Date Value Ref Range Status  . C Diff antigen 08/23/2015 POSITIVE* NEGATIVE Final  . C Diff toxin 08/23/2015 POSITIVE* NEGATIVE Final  . C Diff interpretation 08/23/2015 Positive for toxigenic C. difficile   Final   Comment: RESULT CALLED TO, READ BACK BY AND VERIFIED WITH: Sharonna Vinje,C NP AT 1511 ON 4.7.17 BY EPPERSON,S   Appointment on 08/23/2015  Component Date Value Ref Range Status  . WBC 08/23/2015 0.2* 3.9 - 10.3 10e3/uL Final  . NEUT# 08/23/2015 0.0* 1.5 - 6.5 10e3/uL Final  . HGB 08/23/2015 11.6  11.6 - 15.9 g/dL Final  . HCT 08/23/2015 34.8  34.8 - 46.6 % Final  . Platelets 08/23/2015 57* 145 - 400 10e3/uL Final  . MCV 08/23/2015 89.2  79.5 - 101.0 fL Final  . MCH 08/23/2015 29.7  25.1 - 34.0 pg Final  . MCHC 08/23/2015 33.3  31.5 - 36.0 g/dL Final  . RBC 08/23/2015 3.90  3.70 - 5.45 10e6/uL Final  . RDW 08/23/2015 15.5* 11.2 - 14.5 % Final  . lymph# 08/23/2015 0.2* 0.9 - 3.3 10e3/uL Final  . MONO# 08/23/2015 0.0* 0.1 - 0.9 10e3/uL Final  . Eosinophils Absolute 08/23/2015 0.0  0.0 - 0.5 10e3/uL Final  . Basophils Absolute 08/23/2015 0.0  0.0 - 0.1 10e3/uL Final  . NEUT% 08/23/2015 4.3* 38.4 - 76.8 % Final  . LYMPH% 08/23/2015 87.0* 14.0 - 49.7 % Final  . MONO% 08/23/2015 8.7  0.0 - 14.0 % Final  . EOS% 08/23/2015 0.0  0.0 - 7.0 % Final  . BASO% 08/23/2015 0.0  0.0 - 2.0 % Final  . Sodium 08/23/2015 138  136 - 145 mEq/L Final    . Potassium 08/23/2015 3.8  3.5 - 5.1 mEq/L Final  .  Chloride 08/23/2015 105  98 - 109 mEq/L Final  . CO2 08/23/2015 23  22 - 29 mEq/L Final  . Glucose 08/23/2015 188* 70 - 140 mg/dl Final   Glucose reference range is for nonfasting patients. Fasting glucose reference range is 70- 100.  Marland Kitchen BUN 08/23/2015 19.8  7.0 - 26.0 mg/dL Final  . Creatinine 08/23/2015 0.8  0.6 - 1.1 mg/dL Final  . Total Bilirubin 08/23/2015 0.86  0.20 - 1.20 mg/dL Final  . Alkaline Phosphatase 08/23/2015 222* 40 - 150 U/L Final  . AST 08/23/2015 42* 5 - 34 U/L Final  . ALT 08/23/2015 58* 0 - 55 U/L Final  . Total Protein 08/23/2015 6.0* 6.4 - 8.3 g/dL Final  . Albumin 08/23/2015 2.6* 3.5 - 5.0 g/dL Final  . Calcium 08/23/2015 8.9  8.4 - 10.4 mg/dL Final  . Anion Gap 08/23/2015 9  3 - 11 mEq/L Final  . EGFR 08/23/2015 73* >90 ml/min/1.73 m2 Final   eGFR is calculated using the CKD-EPI Creatinine Equation (2009)    RADIOGRAPHIC STUDIES: Dg Chest 2 View  08/25/2015  CLINICAL DATA:  Followup CHF and bilateral pleural effusions. Current history of right middle lobe lung cancer. EXAM: CHEST  2 VIEW COMPARISON:  08/23/2015 and earlier, including PET-CT 08/12/2015 and CT chest 07/20/2015. FINDINGS: Cardiac silhouette upper normal in size slightly enlarged, unchanged. Chronic elevation of the left hemidiaphragm, unchanged. Right middle lobe mass extending into the right hilum less conspicuous than on the prior PET-CT and chest CT. No significant change in the mild interstitial pulmonary edema and the moderately large right pleural effusion. Improved aeration in the right middle lobe and right lower lobe with moderate passive atelectasis persisting. No new pulmonary parenchymal abnormalities. Left jugular Port-A-Cath tip remains in the mid SVC. Exaggeration of the usual thoracic kyphosis as noted previously. IMPRESSION: 1. Stable mild diffuse interstitial pulmonary edema and stable moderate size right pleural effusion. 2. Improved  aeration in the right middle lobe and right lower lobe, though moderate passive atelectasis persists. 3. No new abnormalities. Electronically Signed   By: Evangeline Dakin M.D.   On: 08/25/2015 10:36   Dg Chest Port 1 View  08/23/2015  CLINICAL DATA:  Chronic dyspnea. H/o COPD and metastatic lung cancer. Former smoker. EXAM: PORTABLE CHEST 1 VIEW COMPARISON:  07/20/2015 FINDINGS: There is right greater than left hazy lung base opacity. This obscures the right hemidiaphragm. Remainder of the lungs show interstitial thickening. There is some hazy airspace opacity in the right mid lung. No pneumothorax. Cardiac silhouette is mostly obscured by the diaphragms. No gross mediastinal or hilar masses. Left anterior chest wall Port-A-Cath has its tip in the lower superior vena cava. Bony thorax is demineralized but grossly intact. IMPRESSION: 1. Right greater than left pleural effusions. Interstitial thickening and central right lung hazy airspace opacity. Findings consistent with pulmonary edema likely on the basis of mild congestive heart failure. No convincing pneumonia. Electronically Signed   By: Lajean Manes M.D.   On: 08/23/2015 20:25    ASSESSMENT/PLAN:    Small cell carcinoma of right lung [facility]) Patient received cycle 1, day 8 of her carboplatin/etoposide chemotherapy regimen on 08/20/2015.  She also received Neulasta for growth factor support following her chemotherapy.  Patient is scheduled for labs only on 08/27/2015.  She is scheduled for labs, visit, and chemotherapy on 09/03/2015.  C. difficile diarrhea Patient states that she has developed severe, liquid diarrhea with a very foul odor within the past 48 hours.  She has actually started  wearing adult diapers; but states that the liquid stool is so profuse that is leaking out the sides of the diaper.  Patient actually had to change her close 3 different times while at the [facility] today.  Patient denies any abdominal discomfort  whatsoever.  Stool tested positive for C. difficile today.  Due to patient's dehydration, neutropenia, and new diagnosis of C. difficile-patient will be directly admitted to the hospital for further evaluation and management.  Brief history.  Report were called to the hospitalist; prior to the patient being transported to the floor via wheelchair.  Per the cancer Center nurse.  Hypoalbuminemia due to protein-calorie malnutrition ([place]) Patient's albumin remains low at 2.6.  Patient was encouraged to push protein in her diet is much as possible.  Dehydration Patient has had minimal appetite and poor oral intake for the past week.  She is also developed severe diarrhea as well.  She appears dehydrated.  She will receive IV fluid rehydration while the cancer Center today.  Chemotherapy induced neutropenia [facility]) Patient presented to the [place] today with generalized aches, increased fatigue and weakness, and severe diarrhea.  She denies any recent fevers or chills.  Blood counts obtained today reveal a WBC of 0.2, ANC 0.0, and lately count 57.  Reviewed all neutropenia guidelines with both patient and her sisters today.  Due to patient's newly diagnosed C. difficile and therapy-induced neutropenia-patient will be corrected.  Admitted to the floor for further evaluation and management.   Note: Patient should be considered a full code; since there are no advanced directives in her chart.   Patient stated understanding of all instructions; and was in agreement with this plan of care. The patient knows to call the clinic with any problems, questions or concerns.   Review/collaboration with Dr. Burr Medico (Dr. [name] out of office) regarding all aspects of patient's visit today.   Total time spent with patient was 40 minutes;  with greater than 75 percent of that time spent in face to face counseling regarding patient's symptoms,  and coordination of care and follow up.  Disclaimer:This  dictation was prepared with Dragon/digital dictation along with [employer]. Any transcriptional errors that result from this process are unintentional.  Drue Second, NP 08/26/2015

## 2015-08-26 NOTE — Care Management Important Message (Signed)
Important Message  Patient Details IM Letter given to [name]/Case Manager to present to Patient Name: Meghan Castro MRN: [id number] Date of Birth: April 09, 1929   Medicare Important Message Given:  Yes    [name] 08/26/2015, 10:56 AMImportant Message  Patient Details  Name: Meghan Castro MRN: [id number] Date of Birth: 07-11-1928   Medicare Important Message Given:  Yes    [name] 08/26/2015, 10:56 AM

## 2015-08-26 NOTE — Assessment & Plan Note (Signed)
Patient received cycle 1, day 8 of her carboplatin/etoposide chemotherapy regimen on 08/20/2015.  She also received Neulasta for growth factor support following her chemotherapy.  Patient is scheduled for labs only on 08/27/2015.  She is scheduled for labs, visit, and chemotherapy on 09/03/2015.

## 2015-08-26 NOTE — Assessment & Plan Note (Signed)
Patient has had minimal appetite and poor oral intake for the past week.  She is also developed severe diarrhea as well.  She appears dehydrated.  She will receive IV fluid rehydration while the cancer Center today.

## 2015-08-26 NOTE — Assessment & Plan Note (Signed)
Patient's albumin remains low at 2.6.  Patient was encouraged to push protein in her diet is much as possible.

## 2015-08-26 NOTE — Progress Notes (Addendum)
PROGRESS NOTE    Meghan Castro  IRC:[id number]  DOB: 10/14/1928  DOA: 08/23/2015 PCP: [name], MD Outpatient Specialists: Oncology: Dr. Curt Bears.   Hospital course: 82 y.o. woman with a history of COPD on home oxygen at 2L [name], HTN, HLD, TIA, and recent diagnosis of extensive small cell lung cancer (mets to bone, liver, adrenal glands; incurable; started palliative chemotherapy with carboplatin and etoposide on March 28th; received Neulasta injection on April 1st) who was referred for direct admission from the oncology office for management of C diff diarrhea in the setting of profound neutropenia. Her condition deteriorated rapidly in the 24 hours prior to admission with complaints of progressive fatigue, decreased appetite, pain in her neck, chest, back, shoulders and then multiple diarrhea-greenish loose stools. Evaluation in oncologist's office significant for WBC 0.2, platelets 57, C. difficile PCR positive. Directly admitted to medical floor. Improving.   Assessment & Plan:   C. difficile diarrhea - Continue treatment with oral vancomycin and complete 14 days course. Add probiotics. Avoid unnecessary antibiotics. - Improving.   Pancytopenia secondary to chemotherapy - As per discussion with oncology on 4/7, received Neulasta on 4/1 and was advised to hold off giving any more Neulasta and expect white cell to start improving in the next 2 or 3 days. No fevers. - No bleeding except for minimal blood on toilet paper. Monitor closely and transfuse if platelets <10 K or for active bleeding (discussed with Oncology on call 4/8).  - Transfuse PRBC for hemoglobin <7 g per DL. - WBC starting to rise. Hemoglobin 9.4. Platelets 13 but no bleeding. Management as above.  Dyspnea - Likely multifactorial related to bilateral pleural effusions and possible pulmonary edema - Has been getting daily Lasix since admission with some improvement in dyspnea and chest x-ray. Continue  IV Lasix.  - . Echo results as below: Unremarkable.  - Finally able to place Foley catheter on 4/8. - 2.65 mL. - Improving. Continue IV Lasix for additional 24 hours then transitioned to oral.   Acute on chronic diastolic CHF - Management as above.  COPD - Stable without clinical features suggestive of bronchospasm.  Chronic respiratory failure, on home oxygen 2 L/m.  Hypokalemia - Replace as needed and follow.  Lung cancer - Outpatient follow-up with oncology.  TIA - Aspirin on hold secondary to severe thrombocytopenia.  Atypical chest pain - No chest pain currently. EKG 4/8: Independently reviewed. Sinus rhythm without acute findings. QTC 457 ms. - Troponins 2 negative. - Resolved.   DVT prophylaxis: SCD's Code Status: Full Family Communication: None at bedside Disposition Plan: DC home when medically stable   Consultants:  None  Procedures:  Foley catheter 4/8 >  2-D echo: Study Conclusions  - Left ventricle: The cavity size was normal. Systolic function was  normal. The estimated ejection fraction was in the range of 55%  to 60%. Wall motion was normal; there were no regional wall  motion abnormalities. Doppler parameters are consistent with  abnormal left ventricular relaxation (grade 1 diastolic  dysfunction). - Aortic valve: Trileaflet; mildly thickened, mildly calcified  leaflets. There was very mild stenosis. Valve area (VTI): 2.22  cm^2. Valve area (Vmax): 2.18 cm^2. Valve area (Vmean): 2.18  cm^2. - Left atrium: The atrium was moderately dilated. - Pulmonary arteries: Systolic pressure was mildly increased. PA  peak pressure: 39 mm Hg (S).  Antimicrobials:  PO Vancomycin 4/8>   Subjective: Had 1 BM this morning-only one since admission. Denies dyspnea.  Objective: Filed Vitals:  08/25/15 2050 08/25/15 2201 08/26/15 0528 08/26/15 0820  BP: 121/57  130/69   Pulse: 98  86   Temp: 97.9 F (36.6 C)  97.6 F (36.4 C)   TempSrc:  Oral  Oral   Resp: 18  16   Height:      Weight:      SpO2: 97% 98% 99% 98%    Intake/Output Summary (Last 24 hours) at 08/26/15 1152 Last data filed at 08/26/15 1024  Gross per 24 hour  Intake    660 ml  Output   2570 ml  Net  -1910 ml   Filed Weights   08/23/15 1853  Weight: 73.347 kg (161 lb 11.2 oz)    Exam:  General exam: Pleasant elderly female lying comfortably propped up in bed. Respiratory system: Improved breath sounds. Slightly diminished in bases with occasional basal crackles. Rest of lung fields clear without wheezing or rhonchi.. No increased work of breathing. Cardiovascular system: S1 & S2 heard, RRR. No JVD, murmurs, gallops, clicks. Trace bilateral ankle edema - decreasing. Gastrointestinal system: Abdomen is nondistended, soft and nontender. Normal bowel sounds heard. Foley catheter +.  Central nervous system: Alert and oriented. No focal neurological deficits. Extremities: Symmetric 5 x 5 power.   Data Reviewed: Basic Metabolic Panel:  Recent Labs Lab 08/20/15 1314 08/23/15 1344 08/24/15 0450 08/25/15 0500 08/26/15 0535  NA 142 138 136 137 135  K 5.6 Repeated and Verified* 3.8 3.4* 3.8 3.4*  CL  --   --  103 102 99*  CO2 '27 23 25 27 28  '$ GLUCOSE 121 188* 113* 106* 101*  BUN 30.3* 19.'8 17 15 14  '$ CREATININE 0.8 0.8 0.65 0.60 0.62  CALCIUM 9.3 8.9 8.5* 8.7* 8.8*   Liver Function Tests:  Recent Labs Lab 08/20/15 1314 08/23/15 1344 08/24/15 0450  AST 94* 42* 32  ALT 87* 58* 48  ALKPHOS 287* 222* 163*  BILITOT 0.77 0.86 0.8  PROT 6.0* 6.0* 5.4*  ALBUMIN 2.7* 2.6* 2.5*   No results for input(s): LIPASE, AMYLASE in the last 168 hours. No results for input(s): AMMONIA in the last 168 hours. CBC:  Recent Labs Lab 08/20/15 1314 08/23/15 1343 08/23/15 2320 08/24/15 0840 08/25/15 0500 08/26/15 0535  WBC 2.3* 0.2*  --  0.1* 0.6* 1.8*  NEUTROABS 1.9 0.0*  --   --  0.1* 0.7*  HGB 11.0* 11.6 10.2* 9.8* 8.9* 9.4*  HCT 34.0* 34.8 30.3*  27.9* 26.3* 27.4*  MCV 91.3 89.2  --  86.4 88.0 87.5  PLT 133* 57*  --  34* 19* 13*   Cardiac Enzymes:  Recent Labs Lab 08/23/15 2025 08/24/15 0748 08/24/15 1450  TROPONINI <0.03 0.03 <0.03   BNP (last 3 results) No results for input(s): PROBNP in the last 8760 hours. CBG: No results for input(s): GLUCAP in the last 168 hours.  Recent Results (from the past 240 hour(s))  C difficile quick scan w PCR reflex     Status: Abnormal   Collection Time: 08/23/15  3:35 PM  Result Value Ref Range Status   C Diff antigen POSITIVE (A) NEGATIVE Final   C Diff toxin POSITIVE (A) NEGATIVE Final   C Diff interpretation Positive for toxigenic C. difficile  Final    Comment: RESULT CALLED TO, READ BACK BY AND VERIFIED WITH: BACON,C NP AT 1511 ON 4.7.17 BY EPPERSON,S          Studies: Dg Chest 2 View  08/25/2015  CLINICAL DATA:  Followup CHF and bilateral pleural effusions. Current  history of right middle lobe lung cancer. EXAM: CHEST  2 VIEW COMPARISON:  08/23/2015 and earlier, including PET-CT 08/12/2015 and CT chest 07/20/2015. FINDINGS: Cardiac silhouette upper normal in size slightly enlarged, unchanged. Chronic elevation of the left hemidiaphragm, unchanged. Right middle lobe mass extending into the right hilum less conspicuous than on the prior PET-CT and chest CT. No significant change in the mild interstitial pulmonary edema and the moderately large right pleural effusion. Improved aeration in the right middle lobe and right lower lobe with moderate passive atelectasis persisting. No new pulmonary parenchymal abnormalities. Left jugular Port-A-Cath tip remains in the mid SVC. Exaggeration of the usual thoracic kyphosis as noted previously. IMPRESSION: 1. Stable mild diffuse interstitial pulmonary edema and stable moderate size right pleural effusion. 2. Improved aeration in the right middle lobe and right lower lobe, though moderate passive atelectasis persists. 3. No new abnormalities.  Electronically Signed   By: Evangeline Dakin M.D.   On: 08/25/2015 10:36        Scheduled Meds: . antiseptic oral rinse  7 mL Mouth Rinse BID  . furosemide  40 mg Intravenous Daily  . ipratropium-albuterol  3 mL Nebulization TID  . losartan  50 mg Oral Daily  . multivitamin with minerals  1 tablet Oral Daily  . pantoprazole (PROTONIX) IV  40 mg Intravenous Q12H  . predniSONE  10 mg Oral Q breakfast  . saccharomyces boulardii  250 mg Oral BID  . vancomycin  125 mg Oral 4 times per day   Continuous Infusions:   Principal Problem:   C. difficile diarrhea Active Problems:   Essential hypertension   COPD with chronic bronchitis (HCC)   Small cell carcinoma (HCC)   Drug-induced neutropenia (HCC)   Oxygen dependent    Time spent: 20 minutes.    Vernell Leep, MD, FACP, FHM. [facility] Pager [phone]  If 7PM-7AM, please contact night-coverage [email or web address] Password [email or web address] 08/26/2015, 11:52 AM    LOS: 3 days

## 2015-08-27 ENCOUNTER — Other Ambulatory Visit: Payer: Medicare Other

## 2015-08-27 ENCOUNTER — Ambulatory Visit: Payer: Medicare Other

## 2015-08-27 ENCOUNTER — Inpatient Hospital Stay (HOSPITAL_COMMUNITY): Payer: Medicare Other

## 2015-08-27 ENCOUNTER — Encounter: Payer: Medicare Other | Admitting: Nutrition

## 2015-08-27 ENCOUNTER — Ambulatory Visit: Payer: Medicare Other | Admitting: Internal Medicine

## 2015-08-27 LAB — BASIC METABOLIC PANEL
Anion gap: 8 (ref 5–15)
BUN: 16 mg/dL (ref 6–20)
CO2: 30 mmol/L (ref 22–32)
Calcium: 8.2 mg/dL — ABNORMAL LOW (ref 8.9–10.3)
Chloride: 100 mmol/L — ABNORMAL LOW (ref 101–111)
Creatinine, Ser: 0.52 mg/dL (ref 0.44–1.00)
GFR calc Af Amer: >60 mL/min (ref >60)
GFR calc non Af Amer: >60 mL/min (ref >60)
GLUCOSE: 81 mg/dL (ref 65–99)
POTASSIUM: 3.4 mmol/L — AB (ref 3.5–5.1)
Sodium: 138 mmol/L (ref 135–145)

## 2015-08-27 LAB — CBC WITH DIFFERENTIAL/PLATELET
BASOS ABS: 0 10*3/uL (ref 0.0–0.1)
BASOS PCT: 1 %
Eosinophils Absolute: 0 10*3/uL (ref 0.0–0.7)
Eosinophils Relative: 0 %
HEMATOCRIT: 24.8 % — AB (ref 36.0–46.0)
Hemoglobin: 8.7 g/dL — ABNORMAL LOW (ref 12.0–15.0)
LYMPHS ABS: 0 10*3/uL — AB (ref 0.7–4.0)
LYMPHS PCT: 1 %
MCH: 30 pg (ref 26.0–34.0)
MCHC: 35.1 g/dL (ref 30.0–36.0)
MCV: 85.5 fL (ref 78.0–100.0)
Monocytes Absolute: 1.4 10*3/uL — ABNORMAL HIGH (ref 0.1–1.0)
Monocytes Relative: 34 %
NEUTROS PCT: 64 %
NRBC: 18 /100{WBCs} — AB
Neutro Abs: 2.8 10*3/uL (ref 1.7–7.7)
Platelets: 54 10*3/uL — ABNORMAL LOW (ref 150–400)
RBC: 2.9 MIL/uL — ABNORMAL LOW (ref 3.87–5.11)
RDW: 15 % (ref 11.5–15.5)
WBC: 4.2 10*3/uL (ref 4.0–10.5)

## 2015-08-27 LAB — PREPARE PLATELET PHERESIS: UNIT DIVISION: 0

## 2015-08-27 MED ORDER — POTASSIUM CHLORIDE CRYS ER 20 MEQ PO TBCR
40.0000 meq | EXTENDED_RELEASE_TABLET | Freq: Once | ORAL | Status: AC
  Administered 2015-08-27: 40 meq via ORAL
  Filled 2015-08-27: qty 2

## 2015-08-27 NOTE — Progress Notes (Signed)
PROGRESS NOTE    Meghan Castro  GMW:[id number]  DOB: [birth year]  DOA: 08/23/2015 PCP: [name], MD Outpatient Specialists: Oncology: Dr. Curt Bears.   Hospital course: 82 y.o. woman with a history of COPD on home oxygen at 2L [place], HTN, HLD, TIA, and recent diagnosis of extensive small cell lung cancer (mets to bone, liver, adrenal glands; incurable; started palliative chemotherapy with carboplatin and etoposide on March 28th; received Neulasta injection on April 1st) who was referred for direct admission from the oncology office for management of C diff diarrhea in the setting of profound neutropenia. Her condition deteriorated rapidly in the 24 hours prior to admission with complaints of progressive fatigue, decreased appetite, pain in her neck, chest, back, shoulders and then multiple diarrhea-greenish loose stools. Evaluation in oncologist's office significant for WBC 0.2, platelets 57, C. difficile PCR positive. Directly admitted to medical floor. Improving. Diarrhea improved. Dyspnea/acute on chronic diastolic CHF had improved but some dyspnea reported this morning. Possible DC home in the next 1-2 days.   Assessment & Plan:   C. difficile diarrhea - Continue treatment with oral vancomycin and complete 14 days course. Add probiotics. Avoid unnecessary antibiotics. - Improving.   Pancytopenia secondary to chemotherapy - As per discussion with oncology on 4/7, received Neulasta on 4/1 and was advised to hold off giving any more Neulasta and expect white cell to start improving in the next 2 or 3 days. No fevers. - No bleeding except for minimal blood on toilet paper. Monitor closely and transfuse if platelets <10 K or for active bleeding (discussed with Oncology on call 4/8).  - Transfuse PRBC for hemoglobin <7 g per DL. - WBC starting to rise. Hemoglobin 9.4. Platelets 13 but no bleeding. As per oncology recommendation, transfused 1 unit of platelets 4/10. Leukocytosis  resolved. Hemoglobin stable. Platelet count improved to 54. Follow CBC daily.  Dyspnea - Likely multifactorial related to bilateral pleural effusions, COPD and decompensated CHF. - Has been getting daily Lasix since admission with some improvement in dyspnea and chest x-ray. Continue IV Lasix for additional 24 hours and then consider transitioning to oral Lasix.  - Echo results as below: Unremarkable.  - Finally able to place Foley catheter on 4/8. - 3.083 mL since admission. Continue Foley catheter while on IV Lasix then discontinue. - Improving. Had some dyspnea this morning. Repeat chest x-ray today.  Acute on chronic diastolic CHF - Management as above.  COPD - Stable without clinical features suggestive of bronchospasm. Former heavy smoker and quit in 1991.  Chronic respiratory failure, on home oxygen 2 L/m.  Hypokalemia - Replace as needed and follow.  Lung cancer - Outpatient follow-up with oncology.  TIA - Aspirin on hold secondary to severe thrombocytopenia.  Atypical chest pain - No chest pain currently. EKG 4/8: Independently reviewed. Sinus rhythm without acute findings. QTC 457 ms. - Troponins 2 negative. - Resolved.   DVT prophylaxis: SCD's Code Status: Full Family Communication: Discussed with patient's sister on 08/26/15. None at bedside today. Disposition Plan: DC home when medically stable-possibly in the next 24-48 hours.    Consultants:  None  Procedures:  Foley catheter 4/8 >  2-D echo: Study Conclusions  - Left ventricle: The cavity size was normal. Systolic function was  normal. The estimated ejection fraction was in the range of 55%  to 60%. Wall motion was normal; there were no regional wall  motion abnormalities. Doppler parameters are consistent with  abnormal left ventricular relaxation (grade 1 diastolic  dysfunction). -  Aortic valve: Trileaflet; mildly thickened, mildly calcified  leaflets. There was very mild stenosis. Valve  area (VTI): 2.22  cm^2. Valve area (Vmax): 2.18 cm^2. Valve area (Vmean): 2.18  cm^2. - Left atrium: The atrium was moderately dilated. - Pulmonary arteries: Systolic pressure was mildly increased. PA  peak pressure: 39 mm Hg (S).  Antimicrobials:  PO Vancomycin 4/8>   Subjective: 3-4 BMs in the last 24 hours. Some DOE this morning.   Objective: Filed Vitals:   08/26/15 2158 08/27/15 0532 08/27/15 0852 08/27/15 0853  BP: 131/58 127/55    Pulse: 95 87    Temp: 97.7 F (36.5 C) 98.2 F (36.8 C)    TempSrc: Oral Oral    Resp: 18 18    Height:      Weight:      SpO2: 96% 97% 100% 96%    Intake/Output Summary (Last 24 hours) at 08/27/15 1052 Last data filed at 08/27/15 0846  Gross per 24 hour  Intake 965.42 ml  Output   1400 ml  Net -434.58 ml   Filed Weights   08/23/15 1853  Weight: 73.347 kg (161 lb 11.2 oz)    Exam:  General exam: Pleasant elderly female sitting up comfortably on bedside commode. Respiratory system: occasional basal crackles. Rest of lung fields clear without wheezing or rhonchi. No increased work of breathing. Cardiovascular system: S1 & S2 heard, RRR. No JVD, murmurs, gallops, clicks. Trace bilateral ankle edema - decreasing. Gastrointestinal system: Abdomen is nondistended, soft and nontender. Normal bowel sounds heard. Foley catheter +.  Central nervous system: Alert and oriented. No focal neurological deficits. Extremities: Symmetric 5 x 5 power.   Data Reviewed: Basic Metabolic Panel:  Recent Labs Lab 08/23/15 1344 08/24/15 0450 08/25/15 0500 08/26/15 0535 08/27/15 0600  NA 138 136 137 135 138  K 3.8 3.4* 3.8 3.4* 3.4*  CL  --  103 102 99* 100*  CO2 '23 25 27 28 30  '$ GLUCOSE 188* 113* 106* 101* 81  BUN 19.'8 17 15 14 16  '$ CREATININE 0.8 0.65 0.60 0.62 0.52  CALCIUM 8.9 8.5* 8.7* 8.8* 8.2*   Liver Function Tests:  Recent Labs Lab 08/20/15 1314 08/23/15 1344 08/24/15 0450  AST 94* 42* 32  ALT 87* 58* 48  ALKPHOS 287*  222* 163*  BILITOT 0.77 0.86 0.8  PROT 6.0* 6.0* 5.4*  ALBUMIN 2.7* 2.6* 2.5*   No results for input(s): LIPASE, AMYLASE in the last 168 hours. No results for input(s): AMMONIA in the last 168 hours. CBC:  Recent Labs Lab 08/20/15 1314 08/23/15 1343  08/24/15 0840 08/25/15 0500 08/26/15 0535 08/26/15 2130 08/27/15 0600  WBC 2.3* 0.2*  --  0.1* 0.6* 1.8* 4.3 4.2  NEUTROABS 1.9 0.0*  --   --  0.1* 0.7*  --  2.8  HGB 11.0* 11.6  < > 9.8* 8.9* 9.4* 9.1* 8.7*  HCT 34.0* 34.8  < > 27.9* 26.3* 27.4* 26.2* 24.8*  MCV 91.3 89.2  --  86.4 88.0 87.5 85.3 85.5  PLT 133* 57*  --  34* 19* 13* 74* 54*  < > = values in this interval not displayed. Cardiac Enzymes:  Recent Labs Lab 08/23/15 2025 08/24/15 0748 08/24/15 1450  TROPONINI <0.03 0.03 <0.03   BNP (last 3 results) No results for input(s): PROBNP in the last 8760 hours. CBG: No results for input(s): GLUCAP in the last 168 hours.  Recent Results (from the past 240 hour(s))  C difficile quick scan w PCR reflex  Status: Abnormal   Collection Time: 08/23/15  3:35 PM  Result Value Ref Range Status   C Diff antigen POSITIVE (A) NEGATIVE Final   C Diff toxin POSITIVE (A) NEGATIVE Final   C Diff interpretation Positive for toxigenic C. difficile  Final    Comment: RESULT CALLED TO, READ BACK BY AND VERIFIED WITH: BACON,C NP AT 1511 ON 4.7.17 BY EPPERSON,S          Studies: No results found.      Scheduled Meds: . antiseptic oral rinse  7 mL Mouth Rinse BID  . furosemide  40 mg Intravenous Daily  . ipratropium-albuterol  3 mL Nebulization TID  . losartan  50 mg Oral Daily  . multivitamin with minerals  1 tablet Oral Daily  . predniSONE  10 mg Oral Q breakfast  . saccharomyces boulardii  250 mg Oral BID  . vancomycin  125 mg Oral 4 times per day   Continuous Infusions:   Principal Problem:   C. difficile diarrhea Active Problems:   Essential hypertension   COPD with chronic bronchitis (HCC)   Small cell  carcinoma (HCC)   Drug-induced neutropenia (HCC)   Oxygen dependent    Time spent: 20 minutes.    Vernell Leep, MD, FACP, FHM. [facility] Pager [phone]  If 7PM-7AM, please contact night-coverage [email or web address] Password [email or web address] 08/27/2015, 10:52 AM    LOS: 4 days

## 2015-08-27 NOTE — Care Management Note (Signed)
Case Management Note  Patient Details  Name: Meghan Castro MRN: [id number] Date of Birth: October 17, 1928  Subjective/Objective:           82 yo admitted with C. difficile         Action/Plan: From home with sister  Expected Discharge Date:                  Expected Discharge Plan:  [place]  In-House Referral:     Discharge planning Services  CM Consult  Post Acute Care Choice:  Resumption of Svcs/PTA Provider Choice offered to:  Patient  DME Arranged:    DME Agency:     HH Arranged:  RN, PT, Nurse's Aide [place] Agency:  [place]  Status of Service:  In process, will continue to follow  Medicare Important Message Given:  Yes Date Medicare IM Given:    Medicare IM give by:    Date Additional Medicare IM Given:    Additional Medicare Important Message give by:     If discussed at [place] of Stay Meetings, dates discussed:    Additional Comments: Pt states she is currently active with [facility] for PT/RN.  PT recommendations were discussed with pt and she would like to continue with HHPT/RN at DC.  She would also like to add an aide as well.  Will need resumption orders for HHPT/RN/Aide.  It was confirmed with [facility] rep that she is active with them.  It was also confirmed with [facility] DME rep that she currently receives home 02 from [facility].  No additional DME needs communicated.  CM will continue to follow. Lynnell Catalan, RN 08/27/2015, 11:29 AM

## 2015-08-27 NOTE — Progress Notes (Signed)
On call hospitalist was notified of platelet count s/p 1 unit transfusion at 2236 pm.  Platelets went up to 74 from 13.Meghan Castro

## 2015-08-27 NOTE — Progress Notes (Signed)
Pt c/o SOB. VS taken. Oxygen 98% on 4L. RR 22. Pt work of breathing was labored. Respiratory treatment ordered. Will continue to monitor.

## 2015-08-28 ENCOUNTER — Ambulatory Visit: Payer: Medicare Other

## 2015-08-28 DIAGNOSIS — I10 Essential (primary) hypertension: Secondary | ICD-10-CM

## 2015-08-28 DIAGNOSIS — C801 Malignant (primary) neoplasm, unspecified: Secondary | ICD-10-CM

## 2015-08-28 DIAGNOSIS — A047 Enterocolitis due to Clostridium difficile: Principal | ICD-10-CM

## 2015-08-28 DIAGNOSIS — Z9981 Dependence on supplemental oxygen: Secondary | ICD-10-CM

## 2015-08-28 DIAGNOSIS — D702 Other drug-induced agranulocytosis: Secondary | ICD-10-CM

## 2015-08-28 DIAGNOSIS — J449 Chronic obstructive pulmonary disease, unspecified: Secondary | ICD-10-CM

## 2015-08-28 LAB — BASIC METABOLIC PANEL
Anion gap: 5 (ref 5–15)
BUN: 14 mg/dL (ref 6–20)
CHLORIDE: 100 mmol/L — AB (ref 101–111)
CO2: 33 mmol/L — AB (ref 22–32)
Calcium: 8.2 mg/dL — ABNORMAL LOW (ref 8.9–10.3)
Creatinine, Ser: 0.51 mg/dL (ref 0.44–1.00)
GFR calc Af Amer: >60 mL/min (ref >60)
GFR calc non Af Amer: >60 mL/min (ref >60)
GLUCOSE: 82 mg/dL (ref 65–99)
POTASSIUM: 3.8 mmol/L (ref 3.5–5.1)
SODIUM: 138 mmol/L (ref 135–145)

## 2015-08-28 LAB — CBC WITH DIFFERENTIAL/PLATELET
BASOS PCT: 0 %
Basophils Absolute: 0 10*3/uL (ref 0.0–0.1)
EOS ABS: 0 10*3/uL (ref 0.0–0.7)
Eosinophils Relative: 0 %
HCT: 26 % — ABNORMAL LOW (ref 36.0–46.0)
Hemoglobin: 8.9 g/dL — ABNORMAL LOW (ref 12.0–15.0)
Lymphocytes Relative: 13 %
Lymphs Abs: 1.5 10*3/uL (ref 0.7–4.0)
MCH: 29.8 pg (ref 26.0–34.0)
MCHC: 34.2 g/dL (ref 30.0–36.0)
MCV: 87 fL (ref 78.0–100.0)
MONO ABS: 2.1 10*3/uL — AB (ref 0.1–1.0)
Monocytes Relative: 18 %
NEUTROS PCT: 69 %
Neutro Abs: 7.9 10*3/uL — ABNORMAL HIGH (ref 1.7–7.7)
PLATELETS: 55 10*3/uL — AB (ref 150–400)
RBC: 2.99 MIL/uL — ABNORMAL LOW (ref 3.87–5.11)
RDW: 15.5 % (ref 11.5–15.5)
WBC: 11.5 10*3/uL — AB (ref 4.0–10.5)

## 2015-08-28 MED ORDER — POTASSIUM CHLORIDE CRYS ER 20 MEQ PO TBCR
40.0000 meq | EXTENDED_RELEASE_TABLET | Freq: Once | ORAL | Status: AC
  Administered 2015-08-28: 40 meq via ORAL
  Filled 2015-08-28: qty 2

## 2015-08-28 MED ORDER — FUROSEMIDE 10 MG/ML IJ SOLN
40.0000 mg | Freq: Two times a day (BID) | INTRAMUSCULAR | Status: DC
  Administered 2015-08-28 – 2015-08-30 (×4): 40 mg via INTRAVENOUS
  Filled 2015-08-28 (×4): qty 4

## 2015-08-28 NOTE — Progress Notes (Signed)
PROGRESS NOTE    Meghan Castro  PJK:[id number]  DOB: [birth year]  DOA: 08/23/2015 PCP: [name], MD Outpatient Specialists: Oncology: Dr. Curt Bears.  Subjective: Had 1 bowel movement this morning, started to firm up. Lives alone and now temporarily with her sister, CSW to evaluate for SNF.  Hospital course: 82 y.o. woman with a history of COPD on home oxygen at 2L [place], HTN, HLD, TIA, and recent diagnosis of extensive small cell lung cancer (mets to bone, liver, adrenal glands; incurable; started palliative chemotherapy with carboplatin and etoposide on March 28th; received Neulasta injection on April 1st) who was referred for direct admission from the oncology office for management of C diff diarrhea in the setting of profound neutropenia. Her condition deteriorated rapidly in the 24 hours prior to admission with complaints of progressive fatigue, decreased appetite, pain in her neck, chest, back, shoulders and then multiple diarrhea-greenish loose stools. Evaluation in oncologist's office significant for WBC 0.2, platelets 57, C. difficile PCR positive. Directly admitted to medical floor. Improving. Diarrhea improved. Dyspnea/acute on chronic diastolic CHF had improved but some dyspnea reported this morning. Possible DC home in the next 1-2 days.   Assessment & Plan:   C. difficile diarrhea - Continue treatment with oral vancomycin and complete 14 days course. Add probiotics. Avoid unnecessary antibiotics. - Improved, only 1 bowel movement today.  Pancytopenia secondary to chemotherapy - As per discussion with oncology on 4/7, received Neulasta on 4/1 and was advised to hold off giving any more Neulasta and expect white cell to start improving in the next 2 or 3 days. No fevers. - No bleeding except for minimal blood on toilet paper. Monitor closely and transfuse if platelets <10 K or for active bleeding (discussed with Oncology on call 4/8).  - Transfuse PRBC for  hemoglobin <7 g per DL. -Leukopenia resolved, hemoglobin is 8.9 and platelets are 55.  Dyspnea - Likely multifactorial related to bilateral pleural effusions, COPD and decompensated CHF. - Clinically has mild fluid overload, he is on IV diuresis, continue. - Echo results as below: Unremarkable.  - Finally able to place Foley catheter on 4/8. - 3.083 mL since admission. Continue Foley catheter while on IV Lasix then discontinue. - Improving. Had some dyspnea this morning. Repeat chest x-ray today.  Acute on chronic diastolic CHF - Management as above.  COPD - Stable without clinical features suggestive of bronchospasm. Former heavy smoker and quit in 1991.  Chronic respiratory failure, on home oxygen 2 L/m.  Hypokalemia - Replace as needed and follow.  Lung cancer - Outpatient follow-up with oncology.  TIA - Aspirin on hold secondary to severe thrombocytopenia.  Atypical chest pain - No chest pain currently. EKG 4/8: Independently reviewed. Sinus rhythm without acute findings. QTC 457 ms. - Troponins 2 negative. - Resolved.  Deconditioning -Appears to be very deconditioned, seen by physical therapy and recommended home PT with 24-hour supervision. -Patient lives alone till about a month ago, currently she staying with her sister, CSW to evaluate.   DVT prophylaxis: SCD's Code Status: Full Family Communication: Discussed with patient's sister on 08/26/15. None at bedside today. Disposition Plan: DC home when medically stable-possibly in the next 24-48 hours.    Consultants:  None  Procedures:  Foley catheter 4/8 >  2-D echo: Study Conclusions  - Left ventricle: The cavity size was normal. Systolic function was  normal. The estimated ejection fraction was in the range of 55%  to 60%. Wall motion was normal; there were no regional  wall  motion abnormalities. Doppler parameters are consistent with  abnormal left ventricular relaxation (grade 1 diastolic   dysfunction). - Aortic valve: Trileaflet; mildly thickened, mildly calcified  leaflets. There was very mild stenosis. Valve area (VTI): 2.22  cm^2. Valve area (Vmax): 2.18 cm^2. Valve area (Vmean): 2.18  cm^2. - Left atrium: The atrium was moderately dilated. - Pulmonary arteries: Systolic pressure was mildly increased. PA  peak pressure: 39 mm Hg (S).  Antimicrobials:  PO Vancomycin 4/8>    Objective: Filed Vitals:   08/27/15 2041 08/27/15 2057 08/28/15 0538 08/28/15 0902  BP:  125/48 128/50   Pulse:  82 82   Temp:  98.1 F (36.7 C) 97.9 F (36.6 C)   TempSrc:  Oral Oral   Resp:  20 20   Height:      Weight:      SpO2: 98% 95% 98% 98%    Intake/Output Summary (Last 24 hours) at 08/28/15 1423 Last data filed at 08/28/15 1022  Gross per 24 hour  Intake    120 ml  Output   1020 ml  Net   -900 ml   Filed Weights   08/23/15 1853  Weight: 73.347 kg (161 lb 11.2 oz)    Exam:  General exam: Pleasant elderly female sitting up comfortably on bedside commode. Respiratory system: occasional basal crackles. Rest of lung fields clear without wheezing or rhonchi. No increased work of breathing. Cardiovascular system: S1 & S2 heard, RRR. No JVD, murmurs, gallops, clicks. Trace bilateral ankle edema - decreasing. Gastrointestinal system: Abdomen is nondistended, soft and nontender. Normal bowel sounds heard. Foley catheter +.  Central nervous system: Alert and oriented. No focal neurological deficits. Extremities: Symmetric 5 x 5 power.   Data Reviewed: Basic Metabolic Panel:  Recent Labs Lab 08/24/15 0450 08/25/15 0500 08/26/15 0535 08/27/15 0600 08/28/15 0600  NA 136 137 135 138 138  K 3.4* 3.8 3.4* 3.4* 3.8  CL 103 102 99* 100* 100*  CO2 '25 27 28 30 '$ 33*  GLUCOSE 113* 106* 101* 81 82  BUN '17 15 14 16 14  '$ CREATININE 0.65 0.60 0.62 0.52 0.51  CALCIUM 8.5* 8.7* 8.8* 8.2* 8.2*   Liver Function Tests:  Recent Labs Lab 08/23/15 1344 08/24/15 0450  AST 42*  32  ALT 58* 48  ALKPHOS 222* 163*  BILITOT 0.86 0.8  PROT 6.0* 5.4*  ALBUMIN 2.6* 2.5*   No results for input(s): LIPASE, AMYLASE in the last 168 hours. No results for input(s): AMMONIA in the last 168 hours. CBC:  Recent Labs Lab 08/23/15 1343  08/25/15 0500 08/26/15 0535 08/26/15 2130 08/27/15 0600 08/28/15 0600  WBC 0.2*  < > 0.6* 1.8* 4.3 4.2 11.5*  NEUTROABS 0.0*  --  0.1* 0.7*  --  2.8 7.9*  HGB 11.6  < > 8.9* 9.4* 9.1* 8.7* 8.9*  HCT 34.8  < > 26.3* 27.4* 26.2* 24.8* 26.0*  MCV 89.2  < > 88.0 87.5 85.3 85.5 87.0  PLT 57*  < > 19* 13* 74* 54* 55*  < > = values in this interval not displayed. Cardiac Enzymes:  Recent Labs Lab 08/23/15 2025 08/24/15 0748 08/24/15 1450  TROPONINI <0.03 0.03 <0.03   BNP (last 3 results) No results for input(s): PROBNP in the last 8760 hours. CBG: No results for input(s): GLUCAP in the last 168 hours.  Recent Results (from the past 240 hour(s))  C difficile quick scan w PCR reflex     Status: Abnormal   Collection Time: 08/23/15  3:35 PM  Result Value Ref Range Status   C Diff antigen POSITIVE (A) NEGATIVE Final   C Diff toxin POSITIVE (A) NEGATIVE Final   C Diff interpretation Positive for toxigenic C. difficile  Final    Comment: RESULT CALLED TO, READ BACK BY AND VERIFIED WITH: BACON,C NP AT 1511 ON 4.7.17 BY EPPERSON,S          Studies: Dg Chest 2 View  08/27/2015  CLINICAL DATA:  Short of breath.  Lung cancer and COPD. EXAM: CHEST  2 VIEW COMPARISON:  08/25/2015 FINDINGS: Lateral view degraded by patient arm position and obliquity. A left Port-A-Cath terminates at the low SVC or cavoatrial junction. Midline trachea. Normal heart size for level of inspiration. Small right pleural effusion is similar, given differences in technique. No pneumothorax. Interstitial edema is mild, slightly improved. Improved right base aeration. Left hemidiaphragm elevation. Patchy right upper lobe opacity remains. IMPRESSION: Overall  improved aeration, with decreased interstitial edema and right base airspace disease. Small right pleural effusion and patchy right upper lobe opacity remains. Electronically Signed   By: Abigail Miyamoto M.D.   On: 08/27/2015 12:09        Scheduled Meds: . antiseptic oral rinse  7 mL Mouth Rinse BID  . furosemide  40 mg Intravenous Daily  . ipratropium-albuterol  3 mL Nebulization TID  . losartan  50 mg Oral Daily  . multivitamin with minerals  1 tablet Oral Daily  . predniSONE  10 mg Oral Q breakfast  . saccharomyces boulardii  250 mg Oral BID  . vancomycin  125 mg Oral 4 times per day   Continuous Infusions:   Principal Problem:   C. difficile diarrhea Active Problems:   Essential hypertension   COPD with chronic bronchitis (HCC)   Small cell carcinoma (HCC)   Drug-induced neutropenia (HCC)   Oxygen dependent    Time spent: 20 minutes.  Birdie Hopes, MD. [facility] Pager [phone]  If 7PM-7AM, please contact night-coverage [email or web address] Password [email or web address] 08/28/2015, 2:23 PM    LOS: 5 days

## 2015-08-28 NOTE — Progress Notes (Signed)
Physical Therapy Treatment Patient Details Name: Meghan Castro MRN: [id number] DOB: Dec 22, 1928 Today's Date: 08/28/2015    History of Present Illness Patient presents to the ED 08/23/15 with diarrhea,  SOB. Has hiistory of stage IV pulmonary lung cancer,      PT Comments    Pt in bed on 2 lts with her 2 sisters in room.  Pt familiar to me from prior admission.  Assisted OOB to amb to bathroom remaining on O2.  Assisted with amb a limited distance due to 3/4 DOE.  See below  Follow Up Recommendations  Home health PT;Supervision/Assistance - 24 hour (lives with sister)     Equipment Recommendations  None recommended by PT    Recommendations for Other Services       Precautions / Restrictions Precautions Precautions: Fall Precaution Comments: Porta cath, on home O2 Restrictions Weight Bearing Restrictions: No    Mobility  Bed Mobility Overal bed mobility: Modified Independent             General bed mobility comments: increased time   Transfers Overall transfer level: Needs assistance Equipment used: Rolling walker (2 wheeled) Transfers: Sit to/from Stand Sit to Stand: Supervision;Min guard         General transfer comment: one VC safety with turns in bathroom   Ambulation/Gait Ambulation/Gait assistance: Min guard;Min assist Ambulation Distance (Feet): 18 Feet Assistive device: Rolling walker (2 wheeled) Gait Pattern/deviations: Step-through pattern;Decreased stride length     General Gait Details: very limited amb distance and tolerance due to 3/4 DOE.  Remained on 2 lts O2 sats decreased from 96% to 84% with HR 117.  Ceased activity and performed purse lip breathing.     Stairs            Wheelchair Mobility    Modified Rankin (Stroke Patients Only)       Balance                                    Cognition Arousal/Alertness: Awake/alert Behavior During Therapy: WFL for tasks assessed/performed Overall Cognitive Status:  Within Functional Limits for tasks assessed                      Exercises      General Comments        Pertinent Vitals/Pain Pain Assessment: No/denies pain    Home Living                      Prior Function            PT Goals (current goals can now be found in the care plan section) Progress towards PT goals: Progressing toward goals    Frequency  Min 3X/week    PT Plan Current plan remains appropriate    Co-evaluation             End of Session Equipment Utilized During Treatment: Gait belt Activity Tolerance: Treatment limited secondary to medical complications (Comment) Patient left: in bed;with call bell/phone within reach;with bed alarm set;with family/visitor present     Time: 1345-1415 PT Time Calculation (min) (ACUTE ONLY): 30 min  Charges:  $Gait Training: 8-22 mins $Therapeutic Activity: 8-22 mins                    G Codes:      Rica Koyanagi  PTA [facility]  Acute  Rehab Pager  [phone]  

## 2015-08-29 ENCOUNTER — Inpatient Hospital Stay (HOSPITAL_COMMUNITY): Payer: Medicare Other

## 2015-08-29 ENCOUNTER — Ambulatory Visit: Payer: Medicare Other

## 2015-08-29 LAB — CBC WITH DIFFERENTIAL/PLATELET
BAND NEUTROPHILS: 0 %
BASOS ABS: 0 10*3/uL (ref 0.0–0.1)
BASOS PCT: 0 %
BLASTS: 0 %
EOS ABS: 0 10*3/uL (ref 0.0–0.7)
Eosinophils Relative: 0 %
HEMATOCRIT: 28.4 % — AB (ref 36.0–46.0)
HEMOGLOBIN: 9.5 g/dL — AB (ref 12.0–15.0)
LYMPHS PCT: 7 %
Lymphs Abs: 1.3 10*3/uL (ref 0.7–4.0)
MCH: 29.9 pg (ref 26.0–34.0)
MCHC: 33.5 g/dL (ref 30.0–36.0)
MCV: 89.3 fL (ref 78.0–100.0)
METAMYELOCYTES PCT: 0 %
MONO ABS: 3.2 10*3/uL — AB (ref 0.1–1.0)
MYELOCYTES: 2 %
Monocytes Relative: 17 %
Neutro Abs: 14.5 10*3/uL — ABNORMAL HIGH (ref 1.7–7.7)
Neutrophils Relative %: 74 %
OTHER: 0 %
PROMYELOCYTES ABS: 0 %
Platelets: 76 10*3/uL — ABNORMAL LOW (ref 150–400)
RBC: 3.18 MIL/uL — ABNORMAL LOW (ref 3.87–5.11)
RDW: 15.7 % — ABNORMAL HIGH (ref 11.5–15.5)
WBC: 19 10*3/uL — ABNORMAL HIGH (ref 4.0–10.5)
nRBC: 0 /100 WBC

## 2015-08-29 MED ORDER — LORATADINE 10 MG PO TABS
10.0000 mg | ORAL_TABLET | Freq: Every day | ORAL | Status: DC | PRN
Start: 2015-08-29 — End: 2015-08-30
  Administered 2015-08-29: 10 mg via ORAL
  Filled 2015-08-29: qty 1

## 2015-08-29 MED ORDER — FLUTICASONE PROPIONATE 50 MCG/ACT NA SUSP
1.0000 | Freq: Every day | NASAL | Status: DC
  Administered 2015-08-29 – 2015-08-30 (×2): 1 via NASAL
  Filled 2015-08-29: qty 16

## 2015-08-29 NOTE — Progress Notes (Signed)
CSW received consult for SNF placement. CSW reviewed PT note recommending home health; Supervision/assistance 24 hours. CSW confirmed with patient that she plans to return home with sister, Dare at discharge. RNCM, Alinda Sierras made aware.   No further CSW needs identified - CSW signing off.   Raynaldo Opitz, [facility] Clinical Social Worker cell #: [phone]

## 2015-08-29 NOTE — Progress Notes (Signed)
PT Cancellation Note  Patient Details Name: Meghan Castro MRN: [id number] DOB: 05/09/1929   Cancelled Treatment:     out of room for X ray   [name] 08/29/2015, 3:45 PM

## 2015-08-29 NOTE — Progress Notes (Signed)
PROGRESS NOTE    Meghan Castro  UEK:[id number]  DOB: 1928/09/02  DOA: 08/23/2015 PCP: [name], MD Outpatient Specialists: Oncology: Dr. Curt Bears.  Subjective: Low she felt much better yesterday, she was complaining this morning about being short of breath. They will obtain CXR. No change in her oxygen saturation she is on 2 L.  Hospital course: 82 y.o. woman with a history of COPD on home oxygen at 2L [place], HTN, HLD, TIA, and recent diagnosis of extensive small cell lung cancer (mets to bone, liver, adrenal glands; incurable; started palliative chemotherapy with carboplatin and etoposide on March 28th; received Neulasta injection on April 1st) who was referred for direct admission from the oncology office for management of C diff diarrhea in the setting of profound neutropenia. Her condition deteriorated rapidly in the 24 hours prior to admission with complaints of progressive fatigue, decreased appetite, pain in her neck, chest, back, shoulders and then multiple diarrhea-greenish loose stools. Evaluation in oncologist's office significant for WBC 0.2, platelets 57, C. difficile PCR positive. Directly admitted to medical floor. Improving. Diarrhea improved. Dyspnea/acute on chronic diastolic CHF had improved but some dyspnea reported this morning. Possible DC home in the next 1-2 days.   Assessment & Plan:   C. difficile diarrhea - Continue treatment with oral vancomycin and complete 14 days course. Add probiotics. Avoid unnecessary antibiotics. - Improved, only 1 bowel movement today. -Continue current oral vancomycin.  Pancytopenia secondary to chemotherapy - As per discussion with oncology on 4/7, received Neulasta on 4/1 and was advised to hold off giving any more Neulasta and expect white cell to start improving in the next 2 or 3 days. No fevers. - No bleeding except for minimal blood on toilet paper. Monitor closely and transfuse if platelets <10 K or for active  bleeding (discussed with Oncology on call 4/8).  - Transfuse PRBC for hemoglobin <7 g per DL. -Leukopenia resolved, now she has leukocytosis which likely secondary to the Neulasta.  Dyspnea - Likely multifactorial related to SCLC, bilateral pleural effusions, COPD and decompensated CHF. - Clinically has mild fluid overload, he is on IV diuresis, continue. - Echo results as below: Unremarkable.  - Finally able to place Foley catheter on 4/8. - 3.083 mL since admission. Continue Foley catheter while on IV Lasix then discontinue. - Improving. Had some dyspnea this morning. Repeat chest x-ray today.  Acute on chronic diastolic CHF - Management as above.  COPD - Stable without clinical features suggestive of bronchospasm. Former heavy smoker and quit in 1991.  Chronic respiratory failure, on home oxygen 2 L/m.  Hypokalemia - Replace as needed and follow.  Lung cancer - Outpatient follow-up with oncology.  TIA - Aspirin on hold secondary to severe thrombocytopenia.  Atypical chest pain - No chest pain currently. EKG 4/8: Independently reviewed. Sinus rhythm without acute findings. QTC 457 ms. - Troponins 2 negative. - Resolved.  Deconditioning -Appears to be very deconditioned, seen by physical therapy and recommended home PT with 24-hour supervision. -Has plans to go back to her sister's place.   DVT prophylaxis: SCD's Code Status: Full Family Communication: Discussed with patient's sister on 08/26/15. None at bedside today. Disposition Plan: DC home when medically stable-possibly in the next 24-48 hours.    Consultants:  None  Procedures:  Foley catheter 4/8 >  2-D echo: Study Conclusions  - Left ventricle: The cavity size was normal. Systolic function was  normal. The estimated ejection fraction was in the range of 55%  to 60%.  Wall motion was normal; there were no regional wall  motion abnormalities. Doppler parameters are consistent with  abnormal left  ventricular relaxation (grade 1 diastolic  dysfunction). - Aortic valve: Trileaflet; mildly thickened, mildly calcified  leaflets. There was very mild stenosis. Valve area (VTI): 2.22  cm^2. Valve area (Vmax): 2.18 cm^2. Valve area (Vmean): 2.18  cm^2. - Left atrium: The atrium was moderately dilated. - Pulmonary arteries: Systolic pressure was mildly increased. PA  peak pressure: 39 mm Hg (S).  Antimicrobials:  PO Vancomycin 4/8>    Objective: Filed Vitals:   08/28/15 1953 08/28/15 2053 08/29/15 0509 08/29/15 0907  BP:  131/59 121/65   Pulse:  101 94   Temp:  97.9 F (36.6 C) 98.4 F (36.9 C)   TempSrc:  Oral Oral   Resp:  19 18   Height:      Weight:      SpO2: 95% 93% 95% 95%    Intake/Output Summary (Last 24 hours) at 08/29/15 1240 Last data filed at 08/29/15 0802  Gross per 24 hour  Intake    120 ml  Output   1800 ml  Net  -1680 ml   Filed Weights   08/23/15 1853  Weight: 73.347 kg (161 lb 11.2 oz)    Exam:  General exam: Pleasant elderly female sitting up comfortably on bedside commode. Respiratory system: occasional basal crackles. Rest of lung fields clear without wheezing or rhonchi. No increased work of breathing. Cardiovascular system: S1 & S2 heard, RRR. No JVD, murmurs, gallops, clicks. Trace bilateral ankle edema - decreasing. Gastrointestinal system: Abdomen is nondistended, soft and nontender. Normal bowel sounds heard. Foley catheter +.  Central nervous system: Alert and oriented. No focal neurological deficits. Extremities: Symmetric 5 x 5 power.   Data Reviewed: Basic Metabolic Panel:  Recent Labs Lab 08/24/15 0450 08/25/15 0500 08/26/15 0535 08/27/15 0600 08/28/15 0600  NA 136 137 135 138 138  K 3.4* 3.8 3.4* 3.4* 3.8  CL 103 102 99* 100* 100*  CO2 '25 27 28 30 '$ 33*  GLUCOSE 113* 106* 101* 81 82  BUN '17 15 14 16 14  '$ CREATININE 0.65 0.60 0.62 0.52 0.51  CALCIUM 8.5* 8.7* 8.8* 8.2* 8.2*   Liver Function Tests:  Recent  Labs Lab 08/23/15 1344 08/24/15 0450  AST 42* 32  ALT 58* 48  ALKPHOS 222* 163*  BILITOT 0.86 0.8  PROT 6.0* 5.4*  ALBUMIN 2.6* 2.5*   No results for input(s): LIPASE, AMYLASE in the last 168 hours. No results for input(s): AMMONIA in the last 168 hours. CBC:  Recent Labs Lab 08/25/15 0500 08/26/15 0535 08/26/15 2130 08/27/15 0600 08/28/15 0600 08/29/15 0530  WBC 0.6* 1.8* 4.3 4.2 11.5* 19.0*  NEUTROABS 0.1* 0.7*  --  2.8 7.9* 14.5*  HGB 8.9* 9.4* 9.1* 8.7* 8.9* 9.5*  HCT 26.3* 27.4* 26.2* 24.8* 26.0* 28.4*  MCV 88.0 87.5 85.3 85.5 87.0 89.3  PLT 19* 13* 74* 54* 55* 76*   Cardiac Enzymes:  Recent Labs Lab 08/23/15 2025 08/24/15 0748 08/24/15 1450  TROPONINI <0.03 0.03 <0.03   BNP (last 3 results) No results for input(s): PROBNP in the last 8760 hours. CBG: No results for input(s): GLUCAP in the last 168 hours.  Recent Results (from the past 240 hour(s))  C difficile quick scan w PCR reflex     Status: Abnormal   Collection Time: 08/23/15  3:35 PM  Result Value Ref Range Status   C Diff antigen POSITIVE (A) NEGATIVE Final   C  Diff toxin POSITIVE (A) NEGATIVE Final   C Diff interpretation Positive for toxigenic C. difficile  Final    Comment: RESULT CALLED TO, READ BACK BY AND VERIFIED WITH: BACON,C NP AT 1511 ON 4.7.17 BY EPPERSON,S          Studies: No results found.      Scheduled Meds: . antiseptic oral rinse  7 mL Mouth Rinse BID  . fluticasone  1 spray Each Nare Daily  . furosemide  40 mg Intravenous BID  . ipratropium-albuterol  3 mL Nebulization TID  . losartan  50 mg Oral Daily  . multivitamin with minerals  1 tablet Oral Daily  . predniSONE  10 mg Oral Q breakfast  . saccharomyces boulardii  250 mg Oral BID  . vancomycin  125 mg Oral 4 times per day   Continuous Infusions:   Principal Problem:   C. difficile diarrhea Active Problems:   Essential hypertension   COPD with chronic bronchitis (HCC)   Small cell carcinoma (HCC)    Drug-induced neutropenia (HCC)   Oxygen dependent    Time spent: 20 minutes.  Birdie Hopes, MD. [facility] Pager [phone]  If 7PM-7AM, please contact night-coverage [email or web address] Password [email or web address] 08/29/2015, 12:40 PM    LOS: 6 days

## 2015-08-30 ENCOUNTER — Telehealth: Payer: Self-pay | Admitting: Medical Oncology

## 2015-08-30 LAB — CBC WITH DIFFERENTIAL/PLATELET
Band Neutrophils: 7 %
Basophils Absolute: 0 10*3/uL (ref 0.0–0.1)
Basophils Relative: 0 %
Blasts: 0 %
EOS PCT: 0 %
Eosinophils Absolute: 0 10*3/uL (ref 0.0–0.7)
HCT: 27.3 % — ABNORMAL LOW (ref 36.0–46.0)
HEMOGLOBIN: 9.1 g/dL — AB (ref 12.0–15.0)
LYMPHS PCT: 9 %
Lymphs Abs: 1.8 10*3/uL (ref 0.7–4.0)
MCH: 29 pg (ref 26.0–34.0)
MCHC: 33.3 g/dL (ref 30.0–36.0)
MCV: 86.9 fL (ref 78.0–100.0)
MYELOCYTES: 8 %
Metamyelocytes Relative: 1 %
Monocytes Absolute: 2.5 10*3/uL — ABNORMAL HIGH (ref 0.1–1.0)
Monocytes Relative: 13 %
NEUTROS PCT: 61 %
NRBC: 0 /100{WBCs}
Neutro Abs: 15.3 10*3/uL — ABNORMAL HIGH (ref 1.7–7.7)
Other: 0 %
PROMYELOCYTES ABS: 1 %
Platelets: 104 10*3/uL — ABNORMAL LOW (ref 150–400)
RBC: 3.14 MIL/uL — AB (ref 3.87–5.11)
RDW: 15.6 % — ABNORMAL HIGH (ref 11.5–15.5)
WBC: 19.6 10*3/uL — AB (ref 4.0–10.5)

## 2015-08-30 MED ORDER — SACCHAROMYCES BOULARDII 250 MG PO CAPS: 250.0000 mg | ORAL_CAPSULE | Freq: Two times a day (BID) | ORAL | Status: AC

## 2015-08-30 MED ORDER — ALPRAZOLAM 0.25 MG PO TABS: 0.2500 mg | ORAL_TABLET | Freq: Three times a day (TID) | ORAL | Status: DC | PRN

## 2015-08-30 MED ORDER — VANCOMYCIN 50 MG/ML ORAL SOLUTION: 125.0000 mg | Freq: Four times a day (QID) | ORAL | Status: DC

## 2015-08-30 MED ORDER — PREDNISONE 10 MG PO TABS: ORAL_TABLET | ORAL | Status: DC

## 2015-08-30 MED ORDER — TRAMADOL HCL 50 MG PO TABS: 50.0000 mg | ORAL_TABLET | Freq: Four times a day (QID) | ORAL | Status: DC | PRN

## 2015-08-30 MED ORDER — METHYLPREDNISOLONE SODIUM SUCC 125 MG IJ SOLR
125.0000 mg | Freq: Once | INTRAMUSCULAR | Status: AC
  Administered 2015-08-30: 125 mg via INTRAVENOUS
  Filled 2015-08-30: qty 2

## 2015-08-30 MED ORDER — HEPARIN SOD (PORK) LOCK FLUSH 100 UNIT/ML IV SOLN
500.0000 [IU] | Freq: Once | INTRAVENOUS | Status: AC
  Administered 2015-08-30: 500 [IU] via INTRAVENOUS
  Filled 2015-08-30: qty 5

## 2015-08-30 MED ORDER — FUROSEMIDE 40 MG PO TABS: 40.0000 mg | ORAL_TABLET | Freq: Every day | ORAL | Status: AC

## 2015-08-30 NOTE — Care Management Important Message (Signed)
Important Message  Patient Details IM Letter given to [name]/Case Manager to present to Patient Name: Meghan Castro MRN: [id number] Date of Birth: 01-04-1929   Medicare Important Message Given:  Yes    [name] 08/30/2015, 9:11 AMImportant Message  Patient Details  Name: Meghan Castro MRN: [id number] Date of Birth: 08/05/1928   Medicare Important Message Given:  Yes    [name] 08/30/2015, 9:10 AM

## 2015-08-30 NOTE — Discharge Summary (Signed)
Physician Discharge Summary  Shauna Bodkins Scallan ZJI:[id number] DOB: Nov 08, 1928 DOA: 08/23/2015  PCP: [name], MD  Admit date: 08/23/2015 Discharge date: 08/30/2015  Time spent: 40 minutes  Recommendations for Outpatient Follow-up:  1. Follow up with nursing home M.D. 2. Continue vancomycin 125 mg 4 times a day for 7 more days to complete total of 14 days of antibiotics. 3. Added Lasix 40 mg daily and prednisone taper, after the prednisone taper, back to 10 mg of home dose of prednisone.   Discharge Diagnoses:  Principal Problem:   C. difficile diarrhea Active Problems:   Essential hypertension   COPD with chronic bronchitis ([place])   Small cell carcinoma (HCC)   Drug-induced neutropenia (HCC)   Oxygen dependent   Discharge Condition: Stable  Diet recommendation: Heart healthy  Filed Weights   08/23/15 1853  Weight: 73.347 kg (161 lb 11.2 oz)    History of present illness:  KARLENA LUEBKE is a 82 y.o. woman with a history of COPD on home oxygen at 2L [place], HTN, HLD, TIA, and recent diagnosis of extensive small cell lung cancer (mets to bone, liver, adrenal glands; incurable; started palliative chemotherapy with carboplatin and etoposide on March 28th; received Neulasta injection on April 1st) who was referred for direct admission from the oncology office for management of C diff diarrhea in the setting of profound neutropenia. She is accompanied by two sisters, who confirm that the patient has had a rapid change in status in the past 24 hours. She has experienced progressive fatigue, decreased appetite, and pain in her chest, neck, shoulders, and back. Around 3AM, she developed diarrhea that was initially "dark and tarry", then green, then clear "like water". She has had 7-8 loose stools today. She has seen some bright red blood on the toilet paper when she wipes. No history of ulcers or GI bleed. No associated nausea or vomiting. She has not taken iron supplements or  Pepto-Bismol. She has had some abdominal pain. No fevers, chills, or sweats. No light-headedness or dizziness. She has had increased ankle edema, but also reports that she has not taken lasix in almost one month.  Evaluation in the oncology office today concerning for a WBC count of 0.2, platelet count of 57, and positive stool C diff toxin and antigen. She has been referred for direct admission to the hospital. Per documentation by Dr. Algis Liming, we will not give additional neulasta at this point.  Hospital Course:    C. difficile diarrhea - Continue treatment with oral vancomycin and complete 14 days course.  -Clinical improvement with less watery bowel movements. -Padded Florastor, please avoid unnecessary antibiotics.  Pancytopenia secondary to chemotherapy - As per discussion with oncology on 4/7, received Neulasta on 4/1 and was advised to hold off giving any more Neulasta and expect white cell to start improving in the next 2 or 3 days. No fevers. - No bleeding except for minimal blood on toilet paper. Monitor closely and transfuse if platelets <10 K or for active bleeding (discussed with Oncology on call 4/8).  - Transfuse PRBC for hemoglobin <7 g per DL. -Leukopenia resolved, now she has leukocytosis which likely secondary to the Neulasta.  Dyspnea - Likely multifactorial related to SCLC, bilateral pleural effusions, COPD and decompensated CHF. - Clinically has mild fluid overload, he is on IV diuresis, continue. - Echo results as below: Unremarkable.  - Finally able to place Foley catheter on 4/8. - 3.083 mL since admission. Continue Foley catheter while on IV Lasix then discontinue. -  Improving. Had some dyspnea 1 day prior to discharge, repeat chest x-ray showed improvement of interstitial fluids.  Acute on chronic diastolic CHF - Has mild fluid overload, IV diuresis continued this is likely mild diastolic CHF. -LVEF is 6065% and has grade 1 diastolic on 2-D echo done  on 08/25/2015. -She was treated with IV diuresis while she is in hospital -She is on Cozaar this is continued, added 40 mg of Lasix daily.  COPD - Stable without clinical features suggestive of bronchospasm. Former heavy smoker and quit in 1991.  Chronic respiratory failure, on home oxygen 2 L/m.  Hypokalemia - Replace as needed and follow.  Lung cancer - Outpatient follow-up with oncology.  TIA - Aspirin on hold secondary to severe thrombocytopenia.  Atypical chest pain - No chest pain currently. EKG 4/8: Independently reviewed. Sinus rhythm without acute findings. QTC 457 ms. - Troponins 2 negative. - Resolved.  Deconditioning -Appears to be very deconditioned, seen by physical therapy and recommended home PT with 24-hour supervision. -Has plans to go back to her sister's place.   Procedures:  2-D echo. Study Conclusions  - Left ventricle: The cavity size was normal. Systolic function was  normal. The estimated ejection fraction was in the range of 55%  to 60%. Wall motion was normal; there were no regional wall  motion abnormalities. Doppler parameters are consistent with  abnormal left ventricular relaxation (grade 1 diastolic  dysfunction). - Aortic valve: Trileaflet; mildly thickened, mildly calcified  leaflets. There was very mild stenosis. Valve area (VTI): 2.22  cm^2. Valve area (Vmax): 2.18 cm^2. Valve area (Vmean): 2.18  cm^2. - Left atrium: The atrium was moderately dilated. - Pulmonary arteries: Systolic pressure was mildly increased. PA  peak pressure: 39 mm Hg (S).  Consultations:  None  Discharge Exam: Filed Vitals:   08/29/15 2004 08/30/15 0443  BP: 140/59 111/54  Pulse: 99 94  Temp: 98.4 F (36.9 C) 98.1 F (36.7 C)  Resp: 20 18   General: Alert and awake, oriented x3, not in any acute distress. HEENT: anicteric sclera, pupils reactive to light and accommodation, EOMI CVS: S1-S2 clear, no murmur rubs or gallops Chest: clear  to auscultation bilaterally, no wheezing, rales or rhonchi Abdomen: soft nontender, nondistended, normal bowel sounds, no organomegaly Extremities: no cyanosis, clubbing or edema noted bilaterally Neuro: Cranial nerves II-XII intact, no focal neurological deficits  Discharge Instructions   Discharge Instructions    Diet - low sodium heart healthy    Complete by:  As directed      Increase activity slowly    Complete by:  As directed           Current Discharge Medication List    START taking these medications   Details  furosemide (LASIX) 40 MG tablet Take 1 tablet (40 mg total) by mouth daily. Qty: 30 tablet    saccharomyces boulardii (FLORASTOR) 250 MG capsule Take 1 capsule (250 mg total) by mouth 2 (two) times daily.    vancomycin (VANCOCIN) 50 mg/mL oral solution Take 2.5 mLs (125 mg total) by mouth every 6 (six) hours.      CONTINUE these medications which have CHANGED   Details  ALPRAZolam (XANAX) 0.25 MG tablet Take 1 tablet (0.25 mg total) by mouth 3 (three) times daily as needed for anxiety. Qty: 10 tablet, Refills: 0   Associated Diagnoses: Primary lung cancer with metastasis from lung to other site, right (HCC)    predniSONE (DELTASONE) 10 MG tablet Take 6-5-4-3-2-1 PO daily, then take  1 tablet PO daily Qty: 21 tablet, Refills: 0   Associated Diagnoses: Primary lung cancer with metastasis from lung to other site, right ([place])      CONTINUE these medications which have NOT CHANGED   Details  acetaminophen (TYLENOL) 500 MG tablet Take 500 mg by mouth every 6 (six) hours as needed for mild pain or moderate pain. Reported on 08/13/2015    aspirin 325 MG EC tablet Take 325 mg by mouth daily.      Ginger, Zingiber officinalis, (GINGER ROOT) 500 MG CAPS Take 2 capsules by mouth daily. Reported on 08/13/2015    lidocaine-prilocaine (EMLA) cream Apply 1 application topically as needed. Qty: 30 g, Refills: 4    loratadine (CLARITIN) 10 MG tablet Take 10 mg by mouth  daily as needed (pain).     losartan (COZAAR) 50 MG tablet Take 50 mg by mouth daily.    MELATONIN PO Take 10 mg by mouth daily.    Multiple Vitamin (MULTIVITAMIN WITH MINERALS) TABS tablet Take 1 tablet by mouth daily.    Omega-3 Fatty Acids (FISH OIL) 1000 MG CAPS Take 1 capsule by mouth daily.    PROAIR HFA 108 (90 BASE) MCG/ACT inhaler INHALE 2 PUFFS INTO THE LUNGS EVERY 4 (FOUR) HOURS AS NEEDED FOR WHEEZING OR SHORTNESS OF BREATH. Qty: 8.5 Inhaler, Refills: 0    prochlorperazine (COMPAZINE) 10 MG tablet Take 1 tablet (10 mg total) by mouth every 6 (six) hours as needed for nausea or vomiting. Qty: 30 tablet, Refills: 0    Tiotropium Bromide Monohydrate (SPIRIVA RESPIMAT) 2.5 MCG/ACT AERS Inhale 2 puffs into the lungs daily. Qty: 1 Inhaler, Refills: 12    traMADol (ULTRAM) 50 MG tablet Take 1 tablet (50 mg total) by mouth every 6 (six) hours as needed. Qty: 30 tablet, Refills: 0   Associated Diagnoses: Small cell lung carcinoma, unspecified laterality (HCC)       Allergies  Allergen Reactions  . Other Shortness Of Breath    *Perfumes*  . Penicillins     Has patient had a PCN reaction causing immediate rash, facial/tongue/throat swelling, SOB or lightheadedness with hypotension: No Has patient had a PCN reaction causing severe rash involving mucus membranes or skin necrosis: No Has patient had a PCN reaction that required hospitalization No Has patient had a PCN reaction occurring within the last 10 years: No If all of the above answers are "NO", then may proceed with Cephalosporin use.   . Pravastatin Sodium     REACTION: swelling in tounge and feet turn black  . Quinolones Other (See Comments)    hyper  . Moxifloxacin Anxiety      The results of significant diagnostics from this hospitalization (including imaging, microbiology, ancillary and laboratory) are listed below for reference.    Significant Diagnostic Studies: Dg Chest 2 View  08/29/2015  CLINICAL DATA:   Cough and shortness of breath. Subsequent encounter. EXAM: CHEST  2 VIEW COMPARISON:  08/27/2015 FINDINGS: There is decreased opacity at the right lung base likely from a combination of decreased pleural fluid and atelectasis. Left pleural fluid and atelectasis are similar to the prior exam. Irregular interstitial thickening with patchy areas of hazy airspace opacity, most noted in the mid and lower lungs, is similar to the prior exam. There are no new areas of lung opacity. Left anterior chest wall Port-A-Cath is stable. IMPRESSION: 1. Mild improvement with decreased opacity at the right lung base from the decrease in the size of the right pleural effusion and decreased  atelectasis. 2. No other convincing change. Persistent left pleural effusion with atelectasis. Persistent irregular interstitial thickening and more subtle areas of mid to lower lung zone hazy airspace opacity. Combination of findings suggests congestive heart failure. Electronically Signed   By: Lajean Manes M.D.   On: 08/29/2015 15:19   Dg Chest 2 View  08/27/2015  CLINICAL DATA:  Short of breath.  Lung cancer and COPD. EXAM: CHEST  2 VIEW COMPARISON:  08/25/2015 FINDINGS: Lateral view degraded by patient arm position and obliquity. A left Port-A-Cath terminates at the low SVC or cavoatrial junction. Midline trachea. Normal heart size for level of inspiration. Small right pleural effusion is similar, given differences in technique. No pneumothorax. Interstitial edema is mild, slightly improved. Improved right base aeration. Left hemidiaphragm elevation. Patchy right upper lobe opacity remains. IMPRESSION: Overall improved aeration, with decreased interstitial edema and right base airspace disease. Small right pleural effusion and patchy right upper lobe opacity remains. Electronically Signed   By: Abigail Miyamoto M.D.   On: 08/27/2015 12:09   Dg Chest 2 View  08/25/2015  CLINICAL DATA:  Followup CHF and bilateral pleural effusions. Current  history of right middle lobe lung cancer. EXAM: CHEST  2 VIEW COMPARISON:  08/23/2015 and earlier, including PET-CT 08/12/2015 and CT chest 07/20/2015. FINDINGS: Cardiac silhouette upper normal in size slightly enlarged, unchanged. Chronic elevation of the left hemidiaphragm, unchanged. Right middle lobe mass extending into the right hilum less conspicuous than on the prior PET-CT and chest CT. No significant change in the mild interstitial pulmonary edema and the moderately large right pleural effusion. Improved aeration in the right middle lobe and right lower lobe with moderate passive atelectasis persisting. No new pulmonary parenchymal abnormalities. Left jugular Port-A-Cath tip remains in the mid SVC. Exaggeration of the usual thoracic kyphosis as noted previously. IMPRESSION: 1. Stable mild diffuse interstitial pulmonary edema and stable moderate size right pleural effusion. 2. Improved aeration in the right middle lobe and right lower lobe, though moderate passive atelectasis persists. 3. No new abnormalities. Electronically Signed   By: Evangeline Dakin M.D.   On: 08/25/2015 10:36   Nm Pet Image Initial (pi) Skull Base To Thigh  08/12/2015  CLINICAL DATA:  Initial treatment strategy for right small cell lung carcinoma. EXAM: NUCLEAR MEDICINE PET SKULL BASE TO THIGH TECHNIQUE: 8.2 mCi F-18 FDG was injected intravenously. Full-ring PET imaging was performed from the skull base to thigh after the radiotracer. CT data was obtained and used for attenuation correction and anatomic localization. FASTING BLOOD GLUCOSE:  Value: 87 mg/dl COMPARISON:  CT on 07/22/2012 FINDINGS: NECK 11 mm hypermetabolic left jugular level 2 lymph node is seen which measures 11 mm on image 22 of series 4, and has SUV max of 16.3. Right supraclavicular lymphadenopathy is seen measuring 2.0 cm on image 36/series 4 which has SUV max of 21.3. CHEST A dominant hypermetabolic mass is seen in the central right middle lobe which shows  involvement of both the right hilum and mediastinum. This measures 5.0 x 5.2 cm on image 63/series 4 and has SUV max of 25.7. This is suspicious for a primary bronchogenic carcinoma. There is peripheral hypermetabolic opacity in the anterior right middle lobe most likely due to postobstructive pneumonitis. This obscures visualization of a previously seen 1.5 cm nodule at this location on previous CT. Bulky hypermetabolic mediastinal lymphadenopathy is seen in the prevascular, right paratracheal and subcarinal regions. Index area of lymphadenopathy in the right paratracheal region measures 3.4 x 4.8 cm on image  49/series 4 and has SUV max of 28.3. Mild hypermetabolic right hilar lymphadenopathy is also seen. A moderate to large right pleural effusion is seen with a focus of hypermetabolic activity in the right posterior pleural space, suggesting a malignant pleural effusion. ABDOMEN/PELVIS Diffusely increased hypermetabolic activity is seen throughout the liver corresponding with innumerable diffuse low-attenuation liver lesions, consistent with diffuse hepatic metastases. SUV max measured in the right hepatic lobe is 23.8. Small bilateral adrenal nodules are also seen showing hypermetabolic activity, consistent with bilateral adrenal metastases. No hypermetabolic lymphadenopathy identified within the abdomen or pelvis. A 3.3 cm infrarenal abdominal aortic aneurysm is noted. SKELETON Diffuse hypermetabolic bone metastases are seen involving the left mandible, sternum, spine, bilateral scapulae and ribs as well as the pelvis, consistent with diffuse bone metastases. IMPRESSION: Dominant 5 cm hypermetabolic mass in the central right middle lobe, which shows involvement of the right hilum and mediastinum. This is suspicious for site of primary bronchogenic carcinoma. Postobstructive pneumonitis in the anterior right middle lobe obscures a 1.5 cm right middle lobe nodule in this region which was better seen on previous  CT. Metastatic lymphadenopathy throughout the mediastinum, right hilum, right supraclavicular region, and left jugular level-II lymph node. Moderate to large right pleural effusion, likely malignant. Diffuse liver metastases and probable small bilateral adrenal metastases. Diffuse bone metastases. 3.3 cm infrarenal abdominal aortic aneurysm incidentally noted. Electronically Signed   By: Earle Gell M.D.   On: 08/12/2015 12:57   Ir Fluoro Guide Cv Line Left  08/05/2015  INDICATION: 82 year old female with a history of small cell lung carcinoma EXAM: IMPLANTED PORT A CATH PLACEMENT WITH ULTRASOUND AND FLUOROSCOPIC GUIDANCE MEDICATIONS: 1 mg vancomycin; The antibiotic was administered within an appropriate time interval prior to skin puncture. ANESTHESIA/SEDATION: Moderate (conscious) sedation was employed during this procedure. A total of Versed 2.0 mg and Fentanyl 100 mcg was administered intravenously. Moderate Sedation Time: 53 minutes. The patient's level of consciousness and vital signs were monitored continuously by radiology nursing throughout the procedure under my direct supervision. FLUOROSCOPY TIME:  Five minutes, 0 seconds (802 mGy) COMPLICATIONS: None PROCEDURE: The procedure, risks, benefits, and alternatives were explained to the patient. Questions regarding the procedure were encouraged and answered. The patient understands and consents to the procedure. Ultrasound survey was performed with images stored and sent to PACs. The right neck and chest was prepped with chlorhexidine, and draped in the usual sterile fashion using maximum barrier technique (cap and mask, sterile gown, sterile gloves, large sterile sheet, [place] hygiene and cutaneous antiseptic). Antibiotic prophylaxis was provided with 1.0g vancomycin administered IV one hour prior to skin incision. Local anesthesia was attained by infiltration with 1% lidocaine without epinephrine. Ultrasound survey of the right neck demonstrated occlusion  of the right internal jugular vein. The skin and subcutaneous tissues were infiltrated 1% lidocaine, and an attempt was made to access the base of the right external jugular vein. This was discovered to also be thrombosed. The left neck and chest were then prepped with chlorhexidine and draped in the usual sterile fashion using maximum barrier technique. Ultrasound demonstrated patency of the left internal jugular vein, and this was documented with an image. Under real-time ultrasound guidance, this vein was accessed with a 21 gauge micropuncture needle and image documentation was performed. A small dermatotomy was made at the access site with an 11 scalpel. A 0.018" wire was advanced into the brachycephalic vein. The access needle exchanged for a 47F micropuncture vascular sheath. Given the course of the 018 wire into  the right brachycephalic vein, a limited venogram was performed. An 015 wire was then advanced through the sheath into the brachycephalic vein a 5 French sheath was placed at the puncture left internal jugular vein. Angled multipurpose catheter and the 035 wire were then used in an attempt to direct the wire into the inferior vena cava. Given the distorted anatomy at the right hilum, the angle approach through the inferior cavoatrial junction was sub optimal. The length of the internal catheter was then estimated by withdrawing the 035 wire through the [place] the catheter, with the targeted anatomy at the distal brachycephalic vein, and horizontal course into the superior cavoatrial junction. The wire was removed and the Kumpe the catheter remain to the 5 [place] sheath. An appropriate location for the subcutaneous reservoir was selected below the clavicle and an incision was made through the skin and underlying soft tissues. The subcutaneous tissues were then dissected using a combination of blunt and sharp surgical technique and a pocket was formed. A single lumen power injectable portacatheter was  then tunneled through the subcutaneous tissues from the pocket to the dermatotomy and the port reservoir placed within the subcutaneous pocket. The venous access site was then serially dilated and a peel away vascular sheath placed over the 035 wire. The wire was removed and the port catheter advanced into position under fluoroscopic guidance. The catheter tip was positioned in the horizontal portion of the brachycephalic vein. This was documented with a spot image. The port catheter was then tested and found to flush and aspirate well. The port was flushed with saline followed by 100 units/mL heparinized saline. The pocket was then closed in two layers using first subdermal inverted interrupted absorbable sutures followed by a running subcuticular suture. The epidermis was then sealed with Dermabond. The dermatotomy at the venous access site was also seal with Dermabond. Patient tolerated the procedure well and remained hemodynamically stable throughout. 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 Patent left internal jugular vein. Distortion of the right hilar anatomy, with engorged left brachycephalic vein. Final image demonstrates port catheter in position with the tip of the catheter at the distal brachial cephalic vein. The tip is within the horizontal segment of the vein (AP dimension), before its confluence with the superior cavoatrial junction. IMPRESSION: Status post left IJ port catheter placement. Catheter ready for use. Signed, Dulcy Fanny. Earleen Newport, DO [facility] Electronically Signed   By: Corrie Mckusick D.O.   On: 08/05/2015 12:17   Ir US Guide Vasc Access Left  08/05/2015  INDICATION: 82 year old female with a history of small cell lung carcinoma EXAM: IMPLANTED PORT A CATH PLACEMENT WITH ULTRASOUND AND FLUOROSCOPIC GUIDANCE MEDICATIONS: 1 mg  vancomycin; The antibiotic was administered within an appropriate time interval prior to skin puncture. ANESTHESIA/SEDATION: Moderate (conscious) sedation was employed during this procedure. A total of Versed 2.0 mg and Fentanyl 100 mcg was administered intravenously. Moderate Sedation Time: 53 minutes. The patient's level of consciousness and vital signs were monitored continuously by radiology nursing throughout the procedure under my direct supervision. FLUOROSCOPY TIME:  Five minutes, 0 seconds (852 mGy) COMPLICATIONS: None PROCEDURE: The procedure, risks, benefits, and alternatives were explained to the patient. Questions regarding the procedure were encouraged and answered. The patient understands and consents to the procedure. Ultrasound survey was performed with images stored and sent to PACs. The right neck and chest was prepped with chlorhexidine, and draped  in the usual sterile fashion using maximum barrier technique (cap and mask, sterile gown, sterile gloves, large sterile sheet, [place] hygiene and cutaneous antiseptic). Antibiotic prophylaxis was provided with 1.0g vancomycin administered IV one hour prior to skin incision. Local anesthesia was attained by infiltration with 1% lidocaine without epinephrine. Ultrasound survey of the right neck demonstrated occlusion of the right internal jugular vein. The skin and subcutaneous tissues were infiltrated 1% lidocaine, and an attempt was made to access the base of the right external jugular vein. This was discovered to also be thrombosed. The left neck and chest were then prepped with chlorhexidine and draped in the usual sterile fashion using maximum barrier technique. Ultrasound demonstrated patency of the left internal jugular vein, and this was documented with an image. Under real-time ultrasound guidance, this vein was accessed with a 21 gauge micropuncture needle and image documentation was performed. A small dermatotomy was made at the access site with  an 11 scalpel. A 0.018" wire was advanced into the brachycephalic vein. The access needle exchanged for a 83F micropuncture vascular sheath. Given the course of the 018 wire into the right brachycephalic vein, a limited venogram was performed. An 015 wire was then advanced through the sheath into the brachycephalic vein a 5 French sheath was placed at the puncture left internal jugular vein. Angled multipurpose catheter and the 035 wire were then used in an attempt to direct the wire into the inferior vena cava. Given the distorted anatomy at the right hilum, the angle approach through the inferior cavoatrial junction was sub optimal. The length of the internal catheter was then estimated by withdrawing the 035 wire through the Kumpe the catheter, with the targeted anatomy at the distal brachycephalic vein, and horizontal course into the superior cavoatrial junction. The wire was removed and the [place] the catheter remain to the 5 [place] sheath. An appropriate location for the subcutaneous reservoir was selected below the clavicle and an incision was made through the skin and underlying soft tissues. The subcutaneous tissues were then dissected using a combination of blunt and sharp surgical technique and a pocket was formed. A single lumen power injectable portacatheter was then tunneled through the subcutaneous tissues from the pocket to the dermatotomy and the port reservoir placed within the subcutaneous pocket. The venous access site was then serially dilated and a peel away vascular sheath placed over the 035 wire. The wire was removed and the port catheter advanced into position under fluoroscopic guidance. The catheter tip was positioned in the horizontal portion of the brachycephalic vein. This was documented with a spot image. The port catheter was then tested and found to flush and aspirate well. The port was flushed with saline followed by 100 units/mL heparinized saline. The pocket was then closed in two  layers using first subdermal inverted interrupted absorbable sutures followed by a running subcuticular suture. The epidermis was then sealed with Dermabond. The dermatotomy at the venous access site was also seal with Dermabond. Patient tolerated the procedure well and remained hemodynamically stable throughout. 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 Patent left internal jugular vein. Distortion of the right hilar anatomy, with engorged left brachycephalic vein. Final image demonstrates port catheter in position with the tip of the catheter at the distal brachial cephalic vein. The tip is within the horizontal segment of the vein (AP dimension), before its confluence with the superior cavoatrial junction. IMPRESSION: Status post left IJ port  catheter placement. Catheter ready for use. Signed, Dulcy Fanny. Earleen Newport, DO [facility] Electronically Signed   By: Corrie Mckusick D.O.   On: 08/05/2015 12:17   Dg Chest Port 1 View  08/23/2015  CLINICAL DATA:  Chronic dyspnea. H/o COPD and metastatic lung cancer. Former smoker. EXAM: PORTABLE CHEST 1 VIEW COMPARISON:  07/20/2015 FINDINGS: There is right greater than left hazy lung base opacity. This obscures the right hemidiaphragm. Remainder of the lungs show interstitial thickening. There is some hazy airspace opacity in the right mid lung. No pneumothorax. Cardiac silhouette is mostly obscured by the diaphragms. No gross mediastinal or hilar masses. Left anterior chest wall Port-A-Cath has its tip in the lower superior vena cava. Bony thorax is demineralized but grossly intact. IMPRESSION: 1. Right greater than left pleural effusions. Interstitial thickening and central right lung hazy airspace opacity. Findings consistent with pulmonary edema likely on the basis of mild congestive heart failure. No convincing  pneumonia. Electronically Signed   By: Lajean Manes M.D.   On: 08/23/2015 20:25    Microbiology: Recent Results (from the past 240 hour(s))  C difficile quick scan w PCR reflex     Status: Abnormal   Collection Time: 08/23/15  3:35 PM  Result Value Ref Range Status   C Diff antigen POSITIVE (A) NEGATIVE Final   C Diff toxin POSITIVE (A) NEGATIVE Final   C Diff interpretation Positive for toxigenic C. difficile  Final    Comment: RESULT CALLED TO, READ BACK BY AND VERIFIED WITH: BACON,C NP AT 1511 ON 4.7.17 BY EPPERSON,S      Labs: Basic Metabolic Panel:  Recent Labs Lab 08/24/15 0450 08/25/15 0500 08/26/15 0535 08/27/15 0600 08/28/15 0600  NA 136 137 135 138 138  K 3.4* 3.8 3.4* 3.4* 3.8  CL 103 102 99* 100* 100*  CO2 '25 27 28 30 '$ 33*  GLUCOSE 113* 106* 101* 81 82  BUN '17 15 14 16 14  '$ CREATININE 0.65 0.60 0.62 0.52 0.51  CALCIUM 8.5* 8.7* 8.8* 8.2* 8.2*   Liver Function Tests:  Recent Labs Lab 08/23/15 1344 08/24/15 0450  AST 42* 32  ALT 58* 48  ALKPHOS 222* 163*  BILITOT 0.86 0.8  PROT 6.0* 5.4*  ALBUMIN 2.6* 2.5*   No results for input(s): LIPASE, AMYLASE in the last 168 hours. No results for input(s): AMMONIA in the last 168 hours. CBC:  Recent Labs Lab 08/26/15 0535 08/26/15 2130 08/27/15 0600 08/28/15 0600 08/29/15 0530 08/30/15 0535  WBC 1.8* 4.3 4.2 11.5* 19.0* 19.6*  NEUTROABS 0.7*  --  2.8 7.9* 14.5* 15.3*  HGB 9.4* 9.1* 8.7* 8.9* 9.5* 9.1*  HCT 27.4* 26.2* 24.8* 26.0* 28.4* 27.3*  MCV 87.5 85.3 85.5 87.0 89.3 86.9  PLT 13* 74* 54* 55* 76* 104*   Cardiac Enzymes:  Recent Labs Lab 08/23/15 2025 08/24/15 0748 08/24/15 1450  TROPONINI <0.03 0.03 <0.03   BNP: BNP (last 3 results)  Recent Labs  08/23/15 2025 08/26/15 0535  BNP 202.6* 271.6*    ProBNP (last 3 results) No results for input(s): PROBNP in the last 8760 hours.  CBG: No results for input(s): GLUCAP in the last 168 hours.     Signed:  Birdie Hopes MD.   [facility] 08/30/2015, 12:31 PM

## 2015-08-30 NOTE — Progress Notes (Signed)
Patient is set to discharge to [facility] today. Patient & sister, Meghan Castro & son, Meghan Castro at bedside made aware. Discharge packet given to RN, Romie Minus. Son & sister to transport to SNF.     Raynaldo Opitz, [facility] Clinical Social Worker cell #: [phone]

## 2015-08-30 NOTE — Care Management Note (Signed)
Case Management Note  Patient Details  Name: Meghan Castro MRN: [id number] Date of Birth: 10-14-1928  Subjective/Objective:     82 yo admitted with cdiff.               Action/Plan: From home with sister and [facility] services.  After speaking with both sister and pt this am.  Pt now to dc to SNF.  CSW aware and working on snf.  Expected Discharge Date:                  Expected Discharge Plan:  Skilled Nursing Facility  In-House Referral:  Clinical Social Work  Discharge planning Services  CM Consult  Post Acute Care Choice:    Choice offered to:     DME Arranged:    DME Agency:     HH Arranged:    [place] Agency:     Status of Service:  Completed, signed off  Medicare Important Message Given:  Yes Date Medicare IM Given:    Medicare IM give by:    Date Additional Medicare IM Given:    Additional Medicare Important Message give by:     If discussed at [place] of Stay Meetings, dates discussed:    Additional CommentsLynnell Catalan, RN 08/30/2015, 1:06 PM [phone]

## 2015-08-30 NOTE — Progress Notes (Addendum)
Physical Therapy Treatment Patient Details Name: Meghan Castro MRN: [id number] DOB: Aug 25, 1928 Today's Date: 08/30/2015    History of Present Illness Patient presents to the ED 08/23/15 with diarrhea,  SOB. Has hiistory of stage IV pulmonary lung cancer,      PT Comments    Pt progressing slowly with limity activity tolerance and 3/4 DOE.  Sister would like to look into ST Rehab at [facility].  Pt would benefit from more skilled PT to address pt's weakness and endurance.  Will consult LPT.   Follow Up Recommendations  SNF     Equipment Recommendations       Recommendations for Other Services       Precautions / Restrictions Precautions Precautions: Fall Precaution Comments: Porta cath, on home O2 Restrictions Weight Bearing Restrictions: No    Mobility  Bed Mobility               General bed mobility comments: Pt OOB in recliner  Transfers Overall transfer level: Needs assistance Equipment used: Rolling walker (2 wheeled)   Sit to Stand: Supervision;Min guard         General transfer comment: one VC Romer placement with stand to sit  Ambulation/Gait Ambulation/Gait assistance: Min guard;Min assist Ambulation Distance (Feet): 22 Feet Assistive device: Rolling walker (2 wheeled) Gait Pattern/deviations: Step-through pattern;Decreased stride length;Trunk flexed Gait velocity: decreased   General Gait Details: very limited amb distance and tolerance due to 3/4 DOE.  Remained on 2 lts O2 sats decreased from 96% to 86% with HR 109.     Stairs            Wheelchair Mobility    Modified Rankin (Stroke Patients Only)       Balance                                    Cognition Arousal/Alertness: Awake/alert Behavior During Therapy: WFL for tasks assessed/performed Overall Cognitive Status: Within Functional Limits for tasks assessed                      Exercises      General Comments        Pertinent Vitals/Pain Pain  Assessment: No/denies pain    Home Living                      Prior Function            PT Goals (current goals can now be found in the care plan section) Progress towards PT goals: Progressing toward goals    Frequency  Min 3X/week    PT Plan Current plan remains appropriate    Co-evaluation             End of Session Equipment Utilized During Treatment: Gait belt;Oxygen Activity Tolerance: Treatment limited secondary to medical complications (Comment) Patient left: in chair;with call bell/phone within reach;with family/visitor present     Time: 1140-1205 PT Time Calculation (min) (ACUTE ONLY): 25 min  Charges:  $Gait Training: 8-22 mins $Therapeutic Activity: 8-22 mins                    G Codes:      Rica Koyanagi  PTA [facility]  Acute  Rehab Pager      [phone]

## 2015-08-30 NOTE — NC FL2 (Signed)
[place] MEDICAID FL2 LEVEL OF CARE SCREENING TOOL     IDENTIFICATION  Patient Name: Meghan Castro Birthdate: [birth year] Sex: female Admission Date (Current Location): 08/23/2015  [facility] and [place] Number:  Herbalist and Address:  [facility],  [place] [address] [place]      Provider Number: [id number]  Attending Physician Name and Address:  Verlee Monte, MD  Relative Name and Phone Number:       Current Level of Care: SNF Recommended Level of Care: [place] Prior Approval Number:    Date Approved/Denied:   PASRR Number: [id number]  Discharge Plan: Home    Current Diagnoses: Patient Active Problem List   Diagnosis Date Noted  . Hypoalbuminemia due to protein-calorie malnutrition ([place]) 08/26/2015  . Dehydration 08/26/2015  . Chemotherapy induced neutropenia ([place]) 08/26/2015  . C. difficile diarrhea 08/23/2015  . Drug-induced neutropenia ([place]) 08/23/2015  . Oxygen dependent 08/23/2015  . Small cell carcinoma of right lung ([place]) 07/31/2015  . Respiratory failure, acute ([place]) 07/26/2015  . Small cell carcinoma ([place])   . Diarrhea   . Mediastinal lymphadenopathy   . Hypoxia 07/20/2015  . COPD exacerbation ([place]) 07/20/2015  . Lung mass 07/20/2015  . Interstitial lung disease ([place]) 05/17/2015  . Heart murmur, systolic 48/18/5631  . Seasonal and perennial allergic rhinitis 01/28/2009  . MACULAR DEGENERATION, BILATERAL 02/02/2008  . Hyperlipemia 08/07/2007  . COPD with chronic bronchitis ([place]) 08/07/2007  . Essential hypertension 07/18/2007  . SINUSITIS, CHRONIC 07/18/2007  . HIATAL HERNIA 07/18/2007  . RENAL INSUFFICIENCY 07/18/2007    Orientation RESPIRATION BLADDER Height & Weight     Self, Time, Situation, Place  Normal Continent Weight: 161 lb 11.2 oz (73.347 kg) Height:  '5\' 2"'$  (157.5 cm)  BEHAVIORAL SYMPTOMS/MOOD NEUROLOGICAL BOWEL NUTRITION STATUS      Continent Diet  AMBULATORY STATUS COMMUNICATION OF  NEEDS Skin   Limited Assist Verbally Normal                       Personal Care Assistance Level of Assistance  Dressing, Bathing Bathing Assistance: Limited assistance   Dressing Assistance: Limited assistance     Functional Limitations Info             SPECIAL CARE FACTORS FREQUENCY  PT (By licensed PT), OT (By licensed OT)     PT Frequency: 5 OT Frequency: 5            Contractures      Additional Factors Info  Code Status, Allergies Code Status Info: Fullcode Allergies Info: Penicillins, Pravastatin Sodium, Quinolones, Moxifloxacin           Current Medications (08/30/2015):  This is the current hospital active medication list Current Facility-Administered Medications  Medication Dose Route Frequency Provider Last Rate Last Dose  . acetaminophen (TYLENOL) tablet 500 mg  500 mg Oral Q6H PRN Modena Jansky, MD   500 mg at 08/25/15 1315  . ALPRAZolam Duanne Moron) tablet 0.25 mg  0.25 mg Oral TID PRN Lily Kocher, MD   0.25 mg at 08/29/15 2008  . antiseptic oral rinse (CPC / CETYLPYRIDINIUM CHLORIDE 0.05%) solution 7 mL  7 mL Mouth Rinse BID Lily Kocher, MD   7 mL at 08/29/15 2009  . fluticasone (FLONASE) 50 MCG/ACT nasal spray 1 spray  1 spray Each Nare Daily Verlee Monte, MD   1 spray at 08/30/15 1031  . furosemide (LASIX) injection 40 mg  40 mg Intravenous BID Verlee Monte, MD  40 mg at 08/30/15 0839  . HYDROcodone-acetaminophen (NORCO/VICODIN) 5-325 MG per tablet 1-2 tablet  1-2 tablet Oral Q6H PRN Modena Jansky, MD   2 tablet at 08/29/15 2008  . ipratropium-albuterol (DUONEB) 0.5-2.5 (3) MG/3ML nebulizer solution 3 mL  3 mL Nebulization TID Lily Kocher, MD   3 mL at 08/30/15 0817  . ipratropium-albuterol (DUONEB) 0.5-2.5 (3) MG/3ML nebulizer solution 3 mL  3 mL Nebulization Q4H PRN Lily Kocher, MD   0 mL at 08/27/15 2040  . loratadine (CLARITIN) tablet 10 mg  10 mg Oral Daily PRN Verlee Monte, MD   10 mg at 08/29/15 1218  . losartan (COZAAR) tablet 50  mg  50 mg Oral Daily Lily Kocher, MD   50 mg at 08/30/15 1031  . morphine 2 MG/ML injection 2 mg  2 mg Intravenous Q4H PRN Lily Kocher, MD      . multivitamin with minerals tablet 1 tablet  1 tablet Oral Daily Lily Kocher, MD   1 tablet at 08/30/15 1031  . ondansetron (ZOFRAN) tablet 4 mg  4 mg Oral Q8H PRN Modena Jansky, MD      . polyvinyl alcohol (LIQUIFILM TEARS) 1.4 % ophthalmic solution 1 drop  1 drop Both Eyes PRN Modena Jansky, MD      . predniSONE (DELTASONE) tablet 10 mg  10 mg Oral Q breakfast Lily Kocher, MD   10 mg at 08/30/15 0839  . saccharomyces boulardii (FLORASTOR) capsule 250 mg  250 mg Oral BID Modena Jansky, MD   250 mg at 08/30/15 1031  . simethicone (MYLICON) chewable tablet 80 mg  80 mg Oral QID PRN Modena Jansky, MD   80 mg at 08/29/15 1902  . vancomycin (VANCOCIN) 50 mg/mL oral solution 125 mg  125 mg Oral 4 times per day Lily Kocher, MD   125 mg at 08/30/15 0534     Discharge Medications: Please see discharge summary for a list of discharge medications.  Relevant Imaging Results:  Relevant Lab Results:   Additional Information SSN: [id number]  Standley Brooking, LCSW

## 2015-08-30 NOTE — Telephone Encounter (Signed)
Dare Tinnin calling about [facility] keeping appt next Tuesday. Pt is being discharged to day to [facility]. Note sent to [facility] about r/s appt .Please call Dare back

## 2015-08-31 ENCOUNTER — Ambulatory Visit: Payer: Medicare Other

## 2015-09-02 ENCOUNTER — Non-Acute Institutional Stay (SKILLED_NURSING_FACILITY): Payer: Medicare Other | Admitting: Internal Medicine

## 2015-09-02 ENCOUNTER — Telehealth: Payer: Self-pay | Admitting: *Deleted

## 2015-09-02 ENCOUNTER — Encounter: Payer: Self-pay | Admitting: Internal Medicine

## 2015-09-02 DIAGNOSIS — A047 Enterocolitis due to Clostridium difficile: Secondary | ICD-10-CM

## 2015-09-02 DIAGNOSIS — J449 Chronic obstructive pulmonary disease, unspecified: Secondary | ICD-10-CM

## 2015-09-02 DIAGNOSIS — D702 Other drug-induced agranulocytosis: Secondary | ICD-10-CM | POA: Diagnosis not present

## 2015-09-02 DIAGNOSIS — I10 Essential (primary) hypertension: Secondary | ICD-10-CM | POA: Diagnosis not present

## 2015-09-02 DIAGNOSIS — E46 Unspecified protein-calorie malnutrition: Secondary | ICD-10-CM

## 2015-09-02 DIAGNOSIS — J309 Allergic rhinitis, unspecified: Secondary | ICD-10-CM | POA: Diagnosis not present

## 2015-09-02 DIAGNOSIS — Z9981 Dependence on supplemental oxygen: Secondary | ICD-10-CM | POA: Diagnosis not present

## 2015-09-02 DIAGNOSIS — J3089 Other allergic rhinitis: Secondary | ICD-10-CM

## 2015-09-02 DIAGNOSIS — C3491 Malignant neoplasm of unspecified part of right bronchus or lung: Secondary | ICD-10-CM | POA: Diagnosis not present

## 2015-09-02 DIAGNOSIS — I5032 Chronic diastolic (congestive) heart failure: Secondary | ICD-10-CM | POA: Diagnosis not present

## 2015-09-02 DIAGNOSIS — J302 Other seasonal allergic rhinitis: Secondary | ICD-10-CM

## 2015-09-02 DIAGNOSIS — A0472 Enterocolitis due to Clostridium difficile, not specified as recurrent: Secondary | ICD-10-CM

## 2015-09-02 NOTE — Progress Notes (Signed)
Patient ID: Smitty Knudsen, female   DOB: [birth year], 82 y.o.   MRN: [id number]    HISTORY AND PHYSICAL   DATE: 09/02/15  Location:  [facility] Room Number: 108 A Place of Service: SNF (31)   Extended Emergency Contact Information Primary Emergency Contact: Dion Saucier of [place] Phone: [phone] Mobile Phone: [phone] Relation: Son Secondary Emergency Contact: Shirlean [place] of [place] Phone: [phone] Mobile Phone: [phone] Relation: Sister  Advanced Directive information Does patient have an advance directive?: No, Would patient like information on creating an advanced directive?: No - patient declined information FULL CODE Chief Complaint  Patient presents with  . New Admit To SNF    HPI:  82 yo female seen today as a new admission into SNF following hospital stay for C diff diarrhea, A/C diastolic HF, SCC lung CA with mets, COPD/O2 dependent, HTN, pancytopenia due to chemotx. She presented to the ED as a direct admit from oncology office with stool (+) C diff toxin/Ag, profound neutropenia (WBC 0.2), plt 59K. She had rec'd neulasta injection prior to admission. CXR -->b/l pleural effusion, decompensated CHF. She was given IV diuretics which improved effusions. 2D echo  Showed nml EF, moderate dilated left atrium and mild AS. Lasix started. vanco solution po QID x 14 days started. She is alos taking prednisone taper.  She presents to SNF for short term rehab  Today she c/o SOB and wheezing. Needs HFA and nebs which she was using at home. She also requests flonase for seasonal allergy. She is taking vanco qid x 7 days (total 14 days tx). No diarrhea today. No f/c or abdominal pain. She is on contact isolation. She is on floraster  HTN/CHF - stable on losartan, lasix  Allergic rhinitis - stable on claritin  COPD - O2 dependent. Takes spiriva, and HFA prn. Followed by pulmonary  SCC lung ca - mets  to bone, liver, adrenal glands. Incurable per oncology. She is followed by oncology. Currently on palliative chemotx. Takes prn xanax for anxiety. Pain stable on tramadol  Past Medical History  Diagnosis Date  . Chronic airway obstruction, not elsewhere classified   . Unspecified sinusitis (chronic)   . Diaphragmatic hernia without mention of obstruction or gangrene   . Macular degeneration (senile) of retina, unspecified   . Other and unspecified hyperlipidemia   . Unspecified essential hypertension   . Unspecified disorder resulting from impaired renal function   . Mediastinal adenopathy   . Cancer [facility])     metastatic lung cancer  . TIA (transient ischemic attack)     Past Surgical History  Procedure Laterality Date  . Arm fracture    . Cholecystectomy    . Total abdominal hysterectomy    . Bilateral salpingoophorectomy    . Incisional hernia repair      abdominal  . Appendectomy    . Cataract extraction    . Endobronchial ultrasound Bilateral 07/24/2015    Procedure: ENDOBRONCHIAL ULTRASOUND;  Surgeon: [name], MD;  Location: [facility]OS[facility];  Service: Cardiopulmonary;  Laterality: Bilateral;  . 2-d echocardiogram      Patient Care Team: Dione Housekeeper, MD as PCP - General (Family Medicine) Curt Bears, MD as Consulting Physician (Oncology)  Social History   Social History  . Marital Status: Widowed    Spouse Name: N/A  . Number of Children: N/A  . Years of Education: N/A   Occupational History  . retired Printmaker  designed fabric on computer   Social History Main Topics  . Smoking status: Former Smoker    Quit date: 11/14/1989  . Smokeless tobacco: Not on file  . Alcohol Use: No  . Drug Use: No  . Sexual Activity: Not on file   Other Topics Concern  . Not on file   Social History Narrative     reports that she quit smoking about 25 years ago. She does not have any smokeless tobacco history on file. She reports that she does not drink  alcohol or use illicit drugs.  Family History  Problem Relation Age of Onset  . Emphysema Father   . Heart disease Mother   . Lung cancer Father    No family status information on file.    Immunization History  Administered Date(s) Administered  . Influenza Split 01/28/2011, 02/01/2012, 03/16/2014  . Influenza Whole 02/02/2008  . Influenza, High Dose Seasonal PF 03/16/2013  . Influenza,inj,Quad PF,36+ Mos 02/16/2015  . PPD Test 08/30/2015  . Pneumococcal Conjugate-13 02/16/2015    Allergies  Allergen Reactions  . Other Shortness Of Breath    *Perfumes*  . Penicillins     Has patient had a PCN reaction causing immediate rash, facial/tongue/throat swelling, SOB or lightheadedness with hypotension: No Has patient had a PCN reaction causing severe rash involving mucus membranes or skin necrosis: No Has patient had a PCN reaction that required hospitalization No Has patient had a PCN reaction occurring within the last 10 years: No If all of the above answers are "NO", then may proceed with Cephalosporin use.   . Pravastatin Sodium     REACTION: swelling in tounge and feet turn black  . Quinolones Other (See Comments)    hyper  . Moxifloxacin Anxiety    Medications: Patient's Medications  New Prescriptions   No medications on file  Previous Medications   ACETAMINOPHEN (TYLENOL) 500 MG TABLET    Take 500 mg by mouth every 6 (six) hours as needed for mild pain or moderate pain. Reported on 08/13/2015   ALPRAZOLAM (XANAX) 0.25 MG TABLET    Take 1 tablet (0.25 mg total) by mouth 3 (three) times daily as needed for anxiety.   ASPIRIN 325 MG EC TABLET    Take 325 mg by mouth daily.     FUROSEMIDE (LASIX) 40 MG TABLET    Take 1 tablet (40 mg total) by mouth daily.   GINGER, ZINGIBER OFFICINALIS, (GINGER ROOT) 500 MG CAPS    Take 2 capsules by mouth daily. Reported on 08/13/2015   LIDOCAINE-PRILOCAINE (EMLA) CREAM    Apply 1 application topically as needed.   LORATADINE (CLARITIN)  10 MG TABLET    Take 10 mg by mouth daily as needed (pain).    LOSARTAN (COZAAR) 50 MG TABLET    Take 50 mg by mouth daily.   MELATONIN PO    Take 10 mg by mouth daily.   MULTIPLE VITAMIN (MULTIVITAMIN WITH MINERALS) TABS TABLET    Take 1 tablet by mouth daily.   OMEGA-3 FATTY ACIDS (FISH OIL) 1000 MG CAPS    Take 1 capsule by mouth daily.   PREDNISONE (DELTASONE) 10 MG TABLET    Take 6-5-4-3-2-1 PO daily, then take 1 tablet PO daily   PROAIR HFA 108 (90 BASE) MCG/ACT INHALER    INHALE 2 PUFFS INTO THE LUNGS EVERY 4 (FOUR) HOURS AS NEEDED FOR WHEEZING OR SHORTNESS OF BREATH.   PROCHLORPERAZINE (COMPAZINE) 10 MG TABLET    Take 1 tablet (10 mg  total) by mouth every 6 (six) hours as needed for nausea or vomiting.   SACCHAROMYCES BOULARDII (FLORASTOR) 250 MG CAPSULE    Take 1 capsule (250 mg total) by mouth 2 (two) times daily.   TIOTROPIUM BROMIDE MONOHYDRATE (SPIRIVA RESPIMAT) 2.5 MCG/ACT AERS    Inhale 2 puffs into the lungs daily.   TRAMADOL (ULTRAM) 50 MG TABLET    Take 1 tablet (50 mg total) by mouth every 6 (six) hours as needed.   VANCOMYCIN (VANCOCIN) 50 MG/ML ORAL SOLUTION    Take 2.5 mLs (125 mg total) by mouth every 6 (six) hours.  Modified Medications   No medications on file  Discontinued Medications   No medications on file    Review of Systems  Constitutional: Positive for fatigue.  Respiratory: Positive for shortness of breath and wheezing.   Neurological: Positive for weakness.  All other systems reviewed and are negative.   Filed Vitals:   09/02/15 1307  BP: 130/60  Pulse: 105  Temp: 98.4 F (36.9 C)  TempSrc: Oral  Resp: 18  Height: '5\' 2"'$  (1.575 m)  Weight: 161 lb 11.2 oz (73.347 kg)  SpO2: 95%   Body mass index is 29.57 kg/(m^2).  Physical Exam  Constitutional: She is oriented to person, place, and time. She appears well-developed.  Frail appearing with min conversational dyspnea. [place] O2 intact. Sitting in w/c  HENT:  Mouth/Throat: Oropharynx is clear and  moist. No oropharyngeal exudate.  Eyes: Pupils are equal, round, and reactive to light. No scleral icterus.  Neck: Neck supple. Carotid bruit is not present. No tracheal deviation present.  Cardiovascular: Intact distal pulses.  An irregular rhythm present. Tachycardia present.  Exam reveals no gallop and no friction rub.   Murmur heard.  Systolic murmur is present with a grade of 1/6  +1 pitting LE edema b/l. No calf TTP  Pulmonary/Chest: Effort normal. No accessory muscle usage or stridor. No respiratory distress. She has decreased breath sounds (b/l). She has no wheezes. She has no rhonchi. She has no rales. She exhibits no tenderness.  Left ACW port-a-cath intact. No redness or TTP; poor inspiratory effort  Abdominal: Soft. Bowel sounds are normal. She exhibits no distension and no mass. There is no hepatomegaly. There is no tenderness. There is no rebound and no guarding.  Musculoskeletal: She exhibits edema.  Lymphadenopathy:    She has no cervical adenopathy.  Neurological: She is alert and oriented to person, place, and time.  Skin: Skin is warm and dry. No rash noted.  Psychiatric: She has a normal mood and affect. Her behavior is normal. Thought content normal.     Labs reviewed: Admission on 08/23/2015, Discharged on 08/30/2015  No results displayed because visit has over 200 results.    Hospital Outpatient Visit on 08/23/2015  Component Date Value Ref Range Status  . C Diff antigen 08/23/2015 POSITIVE* NEGATIVE Final  . C Diff toxin 08/23/2015 POSITIVE* NEGATIVE Final  . C Diff interpretation 08/23/2015 Positive for toxigenic C. difficile   Final   Comment: RESULT CALLED TO, READ BACK BY AND VERIFIED WITH: BACON,C NP AT 1511 ON 4.7.17 BY EPPERSON,S   Appointment on 08/23/2015  Component Date Value Ref Range Status  . WBC 08/23/2015 0.2* 3.9 - 10.3 10e3/uL Final  . NEUT# 08/23/2015 0.0* 1.5 - 6.5 10e3/uL Final  . HGB 08/23/2015 11.6  11.6 - 15.9 g/dL Final  . HCT  08/23/2015 34.8  34.8 - 46.6 % Final  . Platelets 08/23/2015 57* 145 - 400 10e3/uL  Final  . MCV 08/23/2015 89.2  79.5 - 101.0 fL Final  . MCH 08/23/2015 29.7  25.1 - 34.0 pg Final  . MCHC 08/23/2015 33.3  31.5 - 36.0 g/dL Final  . RBC 08/23/2015 3.90  3.70 - 5.45 10e6/uL Final  . RDW 08/23/2015 15.5* 11.2 - 14.5 % Final  . lymph# 08/23/2015 0.2* 0.9 - 3.3 10e3/uL Final  . MONO# 08/23/2015 0.0* 0.1 - 0.9 10e3/uL Final  . Eosinophils Absolute 08/23/2015 0.0  0.0 - 0.5 10e3/uL Final  . Basophils Absolute 08/23/2015 0.0  0.0 - 0.1 10e3/uL Final  . NEUT% 08/23/2015 4.3* 38.4 - 76.8 % Final  . LYMPH% 08/23/2015 87.0* 14.0 - 49.7 % Final  . MONO% 08/23/2015 8.7  0.0 - 14.0 % Final  . EOS% 08/23/2015 0.0  0.0 - 7.0 % Final  . BASO% 08/23/2015 0.0  0.0 - 2.0 % Final  . Sodium 08/23/2015 138  136 - 145 mEq/L Final  . Potassium 08/23/2015 3.8  3.5 - 5.1 mEq/L Final  . Chloride 08/23/2015 105  98 - 109 mEq/L Final  . CO2 08/23/2015 23  22 - 29 mEq/L Final  . Glucose 08/23/2015 188* 70 - 140 mg/dl Final   Glucose reference range is for nonfasting patients. Fasting glucose reference range is 70- 100.  Marland Kitchen BUN 08/23/2015 19.8  7.0 - 26.0 mg/dL Final  . Creatinine 08/23/2015 0.8  0.6 - 1.1 mg/dL Final  . Total Bilirubin 08/23/2015 0.86  0.20 - 1.20 mg/dL Final  . Alkaline Phosphatase 08/23/2015 222* 40 - 150 U/L Final  . AST 08/23/2015 42* 5 - 34 U/L Final  . ALT 08/23/2015 58* 0 - 55 U/L Final  . Total Protein 08/23/2015 6.0* 6.4 - 8.3 g/dL Final  . Albumin 08/23/2015 2.6* 3.5 - 5.0 g/dL Final  . Calcium 08/23/2015 8.9  8.4 - 10.4 mg/dL Final  . Anion Gap 08/23/2015 9  3 - 11 mEq/L Final  . EGFR 08/23/2015 73* >90 ml/min/1.73 m2 Final   eGFR is calculated using the CKD-EPI Creatinine Equation (2009)  Appointment on 08/20/2015  Component Date Value Ref Range Status  . WBC 08/20/2015 2.3* 3.9 - 10.3 10e3/uL Final  . NEUT# 08/20/2015 1.9  1.5 - 6.5 10e3/uL Final  . HGB 08/20/2015 11.0* 11.6 - 15.9  g/dL Final  . HCT 08/20/2015 34.0* 34.8 - 46.6 % Final  . Platelets 08/20/2015 133* 145 - 400 10e3/uL Final  . MCV 08/20/2015 91.3  79.5 - 101.0 fL Final  . MCH 08/20/2015 29.4  25.1 - 34.0 pg Final  . MCHC 08/20/2015 32.2  31.5 - 36.0 g/dL Final  . RBC 08/20/2015 3.73  3.70 - 5.45 10e6/uL Final  . RDW 08/20/2015 15.6* 11.2 - 14.5 % Final  . lymph# 08/20/2015 0.3* 0.9 - 3.3 10e3/uL Final  . MONO# 08/20/2015 0.0* 0.1 - 0.9 10e3/uL Final  . Eosinophils Absolute 08/20/2015 0.0  0.0 - 0.5 10e3/uL Final  . Basophils Absolute 08/20/2015 0.0  0.0 - 0.1 10e3/uL Final  . NEUT% 08/20/2015 82.3* 38.4 - 76.8 % Final  . LYMPH% 08/20/2015 15.1  14.0 - 49.7 % Final  . MONO% 08/20/2015 1.5  0.0 - 14.0 % Final  . EOS% 08/20/2015 0.9  0.0 - 7.0 % Final  . BASO% 08/20/2015 0.2  0.0 - 2.0 % Final  . Sodium 08/20/2015 142  136 - 145 mEq/L Final  . Potassium 08/20/2015 5.6 Repeated and Verified* 3.5 - 5.1 mEq/L Final  . Chloride 08/20/2015 109  98 - 109 mEq/L  Final  . CO2 08/20/2015 27  22 - 29 mEq/L Final  . Glucose 08/20/2015 121  70 - 140 mg/dl Final   Glucose reference range is for nonfasting patients. Fasting glucose reference range is 70- 100.  Marland Kitchen BUN 08/20/2015 30.3* 7.0 - 26.0 mg/dL Final  . Creatinine 08/20/2015 0.8  0.6 - 1.1 mg/dL Final  . Total Bilirubin 08/20/2015 0.77  0.20 - 1.20 mg/dL Final  . Alkaline Phosphatase 08/20/2015 287* 40 - 150 U/L Final  . AST 08/20/2015 94* 5 - 34 U/L Final  . ALT 08/20/2015 87* 0 - 55 U/L Final  . Total Protein 08/20/2015 6.0* 6.4 - 8.3 g/dL Final  . Albumin 08/20/2015 2.7* 3.5 - 5.0 g/dL Final  . Calcium 08/20/2015 9.3  8.4 - 10.4 mg/dL Final  . Anion Gap 08/20/2015 6  3 - 11 mEq/L Final  . EGFR 08/20/2015 70* >90 ml/min/1.73 m2 Final   eGFR is calculated using the CKD-EPI Creatinine Equation (2009)  Appointment on 08/13/2015  Component Date Value Ref Range Status  . WBC 08/13/2015 9.1  3.9 - 10.3 10e3/uL Final  . NEUT# 08/13/2015 7.6* 1.5 - 6.5 10e3/uL  Final  . HGB 08/13/2015 12.7  11.6 - 15.9 g/dL Final  . HCT 08/13/2015 38.9  34.8 - 46.6 % Final  . Platelets 08/13/2015 276  145 - 400 10e3/uL Final  . MCV 08/13/2015 90.1  79.5 - 101.0 fL Final  . MCH 08/13/2015 29.4  25.1 - 34.0 pg Final  . MCHC 08/13/2015 32.6  31.5 - 36.0 g/dL Final  . RBC 08/13/2015 4.32  3.70 - 5.45 10e6/uL Final  . RDW 08/13/2015 15.9* 11.2 - 14.5 % Final  . lymph# 08/13/2015 0.7* 0.9 - 3.3 10e3/uL Final  . MONO# 08/13/2015 0.7  0.1 - 0.9 10e3/uL Final  . Eosinophils Absolute 08/13/2015 0.1  0.0 - 0.5 10e3/uL Final  . Basophils Absolute 08/13/2015 0.0  0.0 - 0.1 10e3/uL Final  . NEUT% 08/13/2015 83.1* 38.4 - 76.8 % Final  . LYMPH% 08/13/2015 7.9* 14.0 - 49.7 % Final  . MONO% 08/13/2015 7.5  0.0 - 14.0 % Final  . EOS% 08/13/2015 1.0  0.0 - 7.0 % Final  . BASO% 08/13/2015 0.5  0.0 - 2.0 % Final  . Sodium 08/13/2015 137  136 - 145 mEq/L Final  . Potassium 08/13/2015 5.2* 3.5 - 5.1 mEq/L Final  . Chloride 08/13/2015 102  98 - 109 mEq/L Final  . CO2 08/13/2015 24  22 - 29 mEq/L Final  . Glucose 08/13/2015 103  70 - 140 mg/dl Final   Glucose reference range is for nonfasting patients. Fasting glucose reference range is 70- 100.  Marland Kitchen BUN 08/13/2015 29.3* 7.0 - 26.0 mg/dL Final  . Creatinine 08/13/2015 1.1  0.6 - 1.1 mg/dL Final  . Total Bilirubin 08/13/2015 1.41* 0.20 - 1.20 mg/dL Final  . Alkaline Phosphatase 08/13/2015 394* 40 - 150 U/L Final  . AST 08/13/2015 226* 5 - 34 U/L Final  . ALT 08/13/2015 189* 0 - 55 U/L Final  . Total Protein 08/13/2015 6.4  6.4 - 8.3 g/dL Final  . Albumin 08/13/2015 2.8* 3.5 - 5.0 g/dL Final  . Calcium 08/13/2015 10.2  8.4 - 10.4 mg/dL Final  . Anion Gap 08/13/2015 10  3 - 11 mEq/L Final  . EGFR 08/13/2015 46* >90 ml/min/1.73 m2 Final   eGFR is calculated using the CKD-EPI Creatinine Equation (2009)  Hospital Outpatient Visit on 08/12/2015  Component Date Value Ref Range Status  . Glucose-Capillary 08/12/2015  87  65 - 99 mg/dL  Final  Appointment on 08/06/2015  Component Date Value Ref Range Status  . WBC 08/06/2015 8.3  3.9 - 10.3 10e3/uL Final  . NEUT# 08/06/2015 6.2  1.5 - 6.5 10e3/uL Final  . HGB 08/06/2015 12.9  11.6 - 15.9 g/dL Final  . HCT 08/06/2015 39.1  34.8 - 46.6 % Final  . Platelets 08/06/2015 191  145 - 400 10e3/uL Final  . MCV 08/06/2015 90.7  79.5 - 101.0 fL Final  . MCH 08/06/2015 29.9  25.1 - 34.0 pg Final  . MCHC 08/06/2015 33.0  31.5 - 36.0 g/dL Final  . RBC 08/06/2015 4.31  3.70 - 5.45 10e6/uL Final  . RDW 08/06/2015 15.8* 11.2 - 14.5 % Final  . lymph# 08/06/2015 1.0  0.9 - 3.3 10e3/uL Final  . MONO# 08/06/2015 1.0* 0.1 - 0.9 10e3/uL Final  . Eosinophils Absolute 08/06/2015 0.1  0.0 - 0.5 10e3/uL Final  . Basophils Absolute 08/06/2015 0.0  0.0 - 0.1 10e3/uL Final  . NEUT% 08/06/2015 74.6  38.4 - 76.8 % Final  . LYMPH% 08/06/2015 11.8* 14.0 - 49.7 % Final  . MONO% 08/06/2015 12.2  0.0 - 14.0 % Final  . EOS% 08/06/2015 1.2  0.0 - 7.0 % Final  . BASO% 08/06/2015 0.2  0.0 - 2.0 % Final  . Sodium 08/06/2015 141  136 - 145 mEq/L Final  . Potassium 08/06/2015 4.6  3.5 - 5.1 mEq/L Final  . Chloride 08/06/2015 105  98 - 109 mEq/L Final  . CO2 08/06/2015 27  22 - 29 mEq/L Final  . Glucose 08/06/2015 106  70 - 140 mg/dl Final   Glucose reference range is for nonfasting patients. Fasting glucose reference range is 70- 100.  Marland Kitchen BUN 08/06/2015 21.7  7.0 - 26.0 mg/dL Final  . Creatinine 08/06/2015 0.8  0.6 - 1.1 mg/dL Final  . Total Bilirubin 08/06/2015 0.95  0.20 - 1.20 mg/dL Final  . Alkaline Phosphatase 08/06/2015 326* 40 - 150 U/L Final  . AST 08/06/2015 180* 5 - 34 U/L Final  . ALT 08/06/2015 169* 0 - 55 U/L Final  . Total Protein 08/06/2015 6.5  6.4 - 8.3 g/dL Final  . Albumin 08/06/2015 2.9* 3.5 - 5.0 g/dL Final  . Calcium 08/06/2015 9.9  8.4 - 10.4 mg/dL Final  . Anion Gap 08/06/2015 9  3 - 11 mEq/L Final  . EGFR 08/06/2015 65* >90 ml/min/1.73 m2 Final   eGFR is calculated using the  CKD-EPI Creatinine Equation (2009)  Hospital Outpatient Visit on 08/05/2015  Component Date Value Ref Range Status  . aPTT 08/05/2015 27  24 - 37 seconds Final  . WBC 08/05/2015 6.9  4.0 - 10.5 K/uL Final  . RBC 08/05/2015 4.45  3.87 - 5.11 MIL/uL Final  . Hemoglobin 08/05/2015 13.0  12.0 - 15.0 g/dL Final  . HCT 08/05/2015 41.5  36.0 - 46.0 % Final  . MCV 08/05/2015 93.3  78.0 - 100.0 fL Final  . MCH 08/05/2015 29.2  26.0 - 34.0 pg Final  . MCHC 08/05/2015 31.3  30.0 - 36.0 g/dL Final  . RDW 08/05/2015 15.9* 11.5 - 15.5 % Final  . Platelets 08/05/2015 194  150 - 400 K/uL Final  . Prothrombin Time 08/05/2015 13.5  11.6 - 15.2 seconds Final  . INR 08/05/2015 1.01  0.00 - 1.49 Final  Admission on 07/20/2015, Discharged on 07/26/2015  Component Date Value Ref Range Status  . Sodium 07/20/2015 140  135 - 145 mmol/L Final  .  Potassium 07/20/2015 4.7  3.5 - 5.1 mmol/L Final  . Chloride 07/20/2015 107  101 - 111 mmol/L Final  . CO2 07/20/2015 22  22 - 32 mmol/L Final  . Glucose, Bld 07/20/2015 105* 65 - 99 mg/dL Final  . BUN 07/20/2015 21* 6 - 20 mg/dL Final  . Creatinine, Ser 07/20/2015 1.09* 0.44 - 1.00 mg/dL Final  . Calcium 07/20/2015 9.7  8.9 - 10.3 mg/dL Final  . GFR calc non Af Amer 07/20/2015 45* >60 mL/min Final  . GFR calc Af Amer 07/20/2015 52* >60 mL/min Final   Comment: (NOTE) The eGFR has been calculated using the CKD EPI equation. This calculation has not been validated in all clinical situations. eGFR's persistently <60 mL/min signify possible Chronic Kidney Disease.   . Anion gap 07/20/2015 11  5 - 15 Final  . WBC 07/20/2015 8.8  4.0 - 10.5 K/uL Final  . RBC 07/20/2015 5.07  3.87 - 5.11 MIL/uL Final  . Hemoglobin 07/20/2015 15.1* 12.0 - 15.0 g/dL Final  . HCT 07/20/2015 46.0  36.0 - 46.0 % Final  . MCV 07/20/2015 90.7  78.0 - 100.0 fL Final  . MCH 07/20/2015 29.8  26.0 - 34.0 pg Final  . MCHC 07/20/2015 32.8  30.0 - 36.0 g/dL Final  . RDW 07/20/2015 14.9  11.5 -  15.5 % Final  . Platelets 07/20/2015 239  150 - 400 K/uL Final  . Lactic Acid, Venous 07/20/2015 1.66  0.5 - 2.0 mmol/L Final  . Specimen Description 07/20/2015 BLOOD BLOOD LEFT ARM   Final  . Special Requests 07/20/2015 BOTTLES DRAWN AEROBIC AND ANAEROBIC 5CC   Final  . Culture 07/20/2015    Final                   Value:NO GROWTH 5 DAYS Performed at [facility]   . Report Status 07/20/2015 07/25/2015 FINAL   Final  . Specimen Description 07/20/2015 BLOOD BLOOD RIGHT ARM   Final  . Special Requests 07/20/2015 IN PEDIATRIC BOTTLE 1.5CC   Final  . Culture 07/20/2015    Final                   Value:NO GROWTH 5 DAYS Performed at [facility]   . Report Status 07/20/2015 07/25/2015 FINAL   Final  . Lactic Acid, Venous 07/20/2015 1.62  0.5 - 2.0 mmol/L Final  . Sodium 07/21/2015 140  135 - 145 mmol/L Final  . Potassium 07/21/2015 5.0  3.5 - 5.1 mmol/L Final  . Chloride 07/21/2015 110  101 - 111 mmol/L Final  . CO2 07/21/2015 20* 22 - 32 mmol/L Final  . Glucose, Bld 07/21/2015 146* 65 - 99 mg/dL Final  . BUN 07/21/2015 19  6 - 20 mg/dL Final  . Creatinine, Ser 07/21/2015 0.85  0.44 - 1.00 mg/dL Final  . Calcium 07/21/2015 9.6  8.9 - 10.3 mg/dL Final  . GFR calc non Af Amer 07/21/2015 >60  >60 mL/min Final  . GFR calc Af Amer 07/21/2015 >60  >60 mL/min Final   Comment: (NOTE) The eGFR has been calculated using the CKD EPI equation. This calculation has not been validated in all clinical situations. eGFR's persistently <60 mL/min signify possible Chronic Kidney Disease.   . Anion gap 07/21/2015 10  5 - 15 Final  . WBC 07/21/2015 3.5* 4.0 - 10.5 K/uL Final  . RBC 07/21/2015 4.57  3.87 - 5.11 MIL/uL Final  . Hemoglobin 07/21/2015 13.4  12.0 - 15.0 g/dL Final  .  HCT 07/21/2015 41.0  36.0 - 46.0 % Final  . MCV 07/21/2015 89.7  78.0 - 100.0 fL Final  . MCH 07/21/2015 29.3  26.0 - 34.0 pg Final  . MCHC 07/21/2015 32.7  30.0 - 36.0 g/dL Final  . RDW 07/21/2015 14.9  11.5 -  15.5 % Final  . Platelets 07/21/2015 175  150 - 400 K/uL Final  . Sodium 07/22/2015 138  135 - 145 mmol/L Final  . Potassium 07/22/2015 4.8  3.5 - 5.1 mmol/L Final  . Chloride 07/22/2015 111  101 - 111 mmol/L Final  . CO2 07/22/2015 19* 22 - 32 mmol/L Final  . Glucose, Bld 07/22/2015 128* 65 - 99 mg/dL Final  . BUN 07/22/2015 23* 6 - 20 mg/dL Final  . Creatinine, Ser 07/22/2015 0.81  0.44 - 1.00 mg/dL Final  . Calcium 07/22/2015 9.3  8.9 - 10.3 mg/dL Final  . GFR calc non Af Amer 07/22/2015 >60  >60 mL/min Final  . GFR calc Af Amer 07/22/2015 >60  >60 mL/min Final   Comment: (NOTE) The eGFR has been calculated using the CKD EPI equation. This calculation has not been validated in all clinical situations. eGFR's persistently <60 mL/min signify possible Chronic Kidney Disease.   . Anion gap 07/22/2015 8  5 - 15 Final  . WBC 07/23/2015 8.4  4.0 - 10.5 K/uL Final  . RBC 07/23/2015 4.33  3.87 - 5.11 MIL/uL Final  . Hemoglobin 07/23/2015 12.6  12.0 - 15.0 g/dL Final  . HCT 07/23/2015 40.1  36.0 - 46.0 % Final  . MCV 07/23/2015 92.6  78.0 - 100.0 fL Final  . MCH 07/23/2015 29.1  26.0 - 34.0 pg Final  . MCHC 07/23/2015 31.4  30.0 - 36.0 g/dL Final  . RDW 07/23/2015 15.7* 11.5 - 15.5 % Final  . Platelets 07/23/2015 214  150 - 400 K/uL Final  . Sodium 07/23/2015 142  135 - 145 mmol/L Final  . Potassium 07/23/2015 5.1  3.5 - 5.1 mmol/L Final  . Chloride 07/23/2015 113* 101 - 111 mmol/L Final  . CO2 07/23/2015 18* 22 - 32 mmol/L Final  . Glucose, Bld 07/23/2015 110* 65 - 99 mg/dL Final  . BUN 07/23/2015 32* 6 - 20 mg/dL Final  . Creatinine, Ser 07/23/2015 0.81  0.44 - 1.00 mg/dL Final  . Calcium 07/23/2015 9.5  8.9 - 10.3 mg/dL Final  . GFR calc non Af Amer 07/23/2015 >60  >60 mL/min Final  . GFR calc Af Amer 07/23/2015 >60  >60 mL/min Final   Comment: (NOTE) The eGFR has been calculated using the CKD EPI equation. This calculation has not been validated in all clinical  situations. eGFR's persistently <60 mL/min signify possible Chronic Kidney Disease.   . Anion gap 07/23/2015 11  5 - 15 Final  . Sodium 07/24/2015 140  135 - 145 mmol/L Final  . Potassium 07/24/2015 5.0  3.5 - 5.1 mmol/L Final  . Chloride 07/24/2015 109  101 - 111 mmol/L Final  . CO2 07/24/2015 21* 22 - 32 mmol/L Final  . Glucose, Bld 07/24/2015 110* 65 - 99 mg/dL Final  . BUN 07/24/2015 30* 6 - 20 mg/dL Final  . Creatinine, Ser 07/24/2015 0.73  0.44 - 1.00 mg/dL Final  . Calcium 07/24/2015 9.1  8.9 - 10.3 mg/dL Final  . GFR calc non Af Amer 07/24/2015 >60  >60 mL/min Final  . GFR calc Af Amer 07/24/2015 >60  >60 mL/min Final   Comment: (NOTE) The eGFR has been calculated using the  CKD EPI equation. This calculation has not been validated in all clinical situations. eGFR's persistently <60 mL/min signify possible Chronic Kidney Disease.   . Anion gap 07/24/2015 10  5 - 15 Final  . Sodium 07/26/2015 141  135 - 145 mmol/L Final  . Potassium 07/26/2015 4.4  3.5 - 5.1 mmol/L Final  . Chloride 07/26/2015 109  101 - 111 mmol/L Final  . CO2 07/26/2015 23  22 - 32 mmol/L Final  . Glucose, Bld 07/26/2015 106* 65 - 99 mg/dL Final  . BUN 08/86/8552 34* 6 - 20 mg/dL Final  . Creatinine, Ser 07/26/2015 0.75  0.44 - 1.00 mg/dL Final  . Calcium 50/60/4933 9.3  8.9 - 10.3 mg/dL Final  . GFR calc non Af Amer 07/26/2015 >60  >60 mL/min Final  . GFR calc Af Amer 07/26/2015 >60  >60 mL/min Final   Comment: (NOTE) The eGFR has been calculated using the CKD EPI equation. This calculation has not been validated in all clinical situations. eGFR's persistently <60 mL/min signify possible Chronic Kidney Disease.   . Anion gap 07/26/2015 9  5 - 15 Final  . WBC 07/26/2015 8.2  4.0 - 10.5 K/uL Final  . RBC 07/26/2015 4.15  3.87 - 5.11 MIL/uL Final  . Hemoglobin 07/26/2015 12.1  12.0 - 15.0 g/dL Final  . HCT 19/91/9060 36.6  36.0 - 46.0 % Final  . MCV 07/26/2015 88.2  78.0 - 100.0 fL Final  . MCH  07/26/2015 29.2  26.0 - 34.0 pg Final  . MCHC 07/26/2015 33.1  30.0 - 36.0 g/dL Final  . RDW 77/91/4497 15.3  11.5 - 15.5 % Final  . Platelets 07/26/2015 193  150 - 400 K/uL Final  Admission on 06/14/2015, Discharged on 06/15/2015  Component Date Value Ref Range Status  . WBC 06/14/2015 6.0  4.0 - 10.5 K/uL Final  . RBC 06/14/2015 4.93  3.87 - 5.11 MIL/uL Final  . Hemoglobin 06/14/2015 14.5  12.0 - 15.0 g/dL Final  . HCT 30/28/8556 43.9  36.0 - 46.0 % Final  . MCV 06/14/2015 89.0  78.0 - 100.0 fL Final  . MCH 06/14/2015 29.4  26.0 - 34.0 pg Final  . MCHC 06/14/2015 33.0  30.0 - 36.0 g/dL Final  . RDW 36/33/0197 14.8  11.5 - 15.5 % Final  . Platelets 06/14/2015 180  150 - 400 K/uL Final  . Neutrophils Relative % 06/14/2015 73   Final  . Neutro Abs 06/14/2015 4.4  1.7 - 7.7 K/uL Final  . Lymphocytes Relative 06/14/2015 19   Final  . Lymphs Abs 06/14/2015 1.1  0.7 - 4.0 K/uL Final  . Monocytes Relative 06/14/2015 8   Final  . Monocytes Absolute 06/14/2015 0.5  0.1 - 1.0 K/uL Final  . Eosinophils Relative 06/14/2015 0   Final  . Eosinophils Absolute 06/14/2015 0.0  0.0 - 0.7 K/uL Final  . Basophils Relative 06/14/2015 0   Final  . Basophils Absolute 06/14/2015 0.0  0.0 - 0.1 K/uL Final  . Sodium 06/14/2015 138  135 - 145 mmol/L Final  . Potassium 06/14/2015 3.9  3.5 - 5.1 mmol/L Final  . Chloride 06/14/2015 106  101 - 111 mmol/L Final  . CO2 06/14/2015 21* 22 - 32 mmol/L Final  . Glucose, Bld 06/14/2015 103* 65 - 99 mg/dL Final  . BUN 36/14/9298 22* 6 - 20 mg/dL Final  . Creatinine, Ser 06/14/2015 0.87  0.44 - 1.00 mg/dL Final  . Calcium 82/13/6536 9.2  8.9 - 10.3 mg/dL Final  .  Total Protein 06/14/2015 7.4  6.5 - 8.1 g/dL Final  . Albumin 06/14/2015 4.0  3.5 - 5.0 g/dL Final  . AST 06/14/2015 38  15 - 41 U/L Final  . ALT 06/14/2015 26  14 - 54 U/L Final  . Alkaline Phosphatase 06/14/2015 75  38 - 126 U/L Final  . Total Bilirubin 06/14/2015 0.9  0.3 - 1.2 mg/dL Final  . GFR calc  non Af Amer 06/14/2015 59* >60 mL/min Final  . GFR calc Af Amer 06/14/2015 >60  >60 mL/min Final   Comment: (NOTE) The eGFR has been calculated using the CKD EPI equation. This calculation has not been validated in all clinical situations. eGFR's persistently <60 mL/min signify possible Chronic Kidney Disease.   . Anion gap 06/14/2015 11  5 - 15 Final  . Color, Urine 06/15/2015 YELLOW  YELLOW Final  . APPearance 06/15/2015 CLEAR  CLEAR Final  . Specific Gravity, Urine 06/15/2015 1.025  1.005 - 1.030 Final  . pH 06/15/2015 6.0  5.0 - 8.0 Final  . Glucose, UA 06/15/2015 NEGATIVE  NEGATIVE mg/dL Final  . Hgb urine dipstick 06/15/2015 NEGATIVE  NEGATIVE Final  . Bilirubin Urine 06/15/2015 NEGATIVE  NEGATIVE Final  . Ketones, ur 06/15/2015 15* NEGATIVE mg/dL Final  . Protein, ur 06/15/2015 TRACE* NEGATIVE mg/dL Final  . Nitrite 06/15/2015 NEGATIVE  NEGATIVE Final  . Leukocytes, UA 06/15/2015 TRACE* NEGATIVE Final  . Squamous Epithelial / LPF 06/15/2015 0-5* NONE SEEN Final  . WBC, UA 06/15/2015 6-30  0 - 5 WBC/hpf Final  . RBC / HPF 06/15/2015 0-5  0 - 5 RBC/hpf Final  . Bacteria, UA 06/15/2015 FEW* NONE SEEN Final  . Urine-Other 06/15/2015 MUCOUS PRESENT   Final  There may be more visits with results that are not included.  Dg Chest 2 View  08/29/2015  CLINICAL DATA:  Cough and shortness of breath. Subsequent encounter. EXAM: CHEST  2 VIEW COMPARISON:  08/27/2015 FINDINGS: There is decreased opacity at the right lung base likely from a combination of decreased pleural fluid and atelectasis. Left pleural fluid and atelectasis are similar to the prior exam. Irregular interstitial thickening with patchy areas of hazy airspace opacity, most noted in the mid and lower lungs, is similar to the prior exam. There are no new areas of lung opacity. Left anterior chest wall Port-A-Cath is stable. IMPRESSION: 1. Mild improvement with decreased opacity at the right lung base from the decrease in the size  of the right pleural effusion and decreased atelectasis. 2. No other convincing change. Persistent left pleural effusion with atelectasis. Persistent irregular interstitial thickening and more subtle areas of mid to lower lung zone hazy airspace opacity. Combination of findings suggests congestive heart failure. Electronically Signed   By: Lajean Manes M.D.   On: 08/29/2015 15:19   Dg Chest 2 View  08/27/2015  CLINICAL DATA:  Short of breath.  Lung cancer and COPD. EXAM: CHEST  2 VIEW COMPARISON:  08/25/2015 FINDINGS: Lateral view degraded by patient arm position and obliquity. A left Port-A-Cath terminates at the low SVC or cavoatrial junction. Midline trachea. Normal heart size for level of inspiration. Small right pleural effusion is similar, given differences in technique. No pneumothorax. Interstitial edema is mild, slightly improved. Improved right base aeration. Left hemidiaphragm elevation. Patchy right upper lobe opacity remains. IMPRESSION: Overall improved aeration, with decreased interstitial edema and right base airspace disease. Small right pleural effusion and patchy right upper lobe opacity remains. Electronically Signed   By: Adria Devon.D.  On: 08/27/2015 12:09   Dg Chest 2 View  08/25/2015  CLINICAL DATA:  Followup CHF and bilateral pleural effusions. Current history of right middle lobe lung cancer. EXAM: CHEST  2 VIEW COMPARISON:  08/23/2015 and earlier, including PET-CT 08/12/2015 and CT chest 07/20/2015. FINDINGS: Cardiac silhouette upper normal in size slightly enlarged, unchanged. Chronic elevation of the left hemidiaphragm, unchanged. Right middle lobe mass extending into the right hilum less conspicuous than on the prior PET-CT and chest CT. No significant change in the mild interstitial pulmonary edema and the moderately large right pleural effusion. Improved aeration in the right middle lobe and right lower lobe with moderate passive atelectasis persisting. No new pulmonary  parenchymal abnormalities. Left jugular Port-A-Cath tip remains in the mid SVC. Exaggeration of the usual thoracic kyphosis as noted previously. IMPRESSION: 1. Stable mild diffuse interstitial pulmonary edema and stable moderate size right pleural effusion. 2. Improved aeration in the right middle lobe and right lower lobe, though moderate passive atelectasis persists. 3. No new abnormalities. Electronically Signed   By: Hulan Saas M.D.   On: 08/25/2015 10:36   Nm Pet Image Initial (pi) Skull Base To Thigh  08/12/2015  CLINICAL DATA:  Initial treatment strategy for right small cell lung carcinoma. EXAM: NUCLEAR MEDICINE PET SKULL BASE TO THIGH TECHNIQUE: 8.2 mCi F-18 FDG was injected intravenously. Full-ring PET imaging was performed from the skull base to thigh after the radiotracer. CT data was obtained and used for attenuation correction and anatomic localization. FASTING BLOOD GLUCOSE:  Value: 87 mg/dl COMPARISON:  CT on 19/80/4878 FINDINGS: NECK 11 mm hypermetabolic left jugular level 2 lymph node is seen which measures 11 mm on image 22 of series 4, and has SUV max of 16.3. Right supraclavicular lymphadenopathy is seen measuring 2.0 cm on image 36/series 4 which has SUV max of 21.3. CHEST A dominant hypermetabolic mass is seen in the central right middle lobe which shows involvement of both the right hilum and mediastinum. This measures 5.0 x 5.2 cm on image 63/series 4 and has SUV max of 25.7. This is suspicious for a primary bronchogenic carcinoma. There is peripheral hypermetabolic opacity in the anterior right middle lobe most likely due to postobstructive pneumonitis. This obscures visualization of a previously seen 1.5 cm nodule at this location on previous CT. Bulky hypermetabolic mediastinal lymphadenopathy is seen in the prevascular, right paratracheal and subcarinal regions. Index area of lymphadenopathy in the right paratracheal region measures 3.4 x 4.8 cm on image 49/series 4 and has SUV  max of 28.3. Mild hypermetabolic right hilar lymphadenopathy is also seen. A moderate to large right pleural effusion is seen with a focus of hypermetabolic activity in the right posterior pleural space, suggesting a malignant pleural effusion. ABDOMEN/PELVIS Diffusely increased hypermetabolic activity is seen throughout the liver corresponding with innumerable diffuse low-attenuation liver lesions, consistent with diffuse hepatic metastases. SUV max measured in the right hepatic lobe is 23.8. Small bilateral adrenal nodules are also seen showing hypermetabolic activity, consistent with bilateral adrenal metastases. No hypermetabolic lymphadenopathy identified within the abdomen or pelvis. A 3.3 cm infrarenal abdominal aortic aneurysm is noted. SKELETON Diffuse hypermetabolic bone metastases are seen involving the left mandible, sternum, spine, bilateral scapulae and ribs as well as the pelvis, consistent with diffuse bone metastases. IMPRESSION: Dominant 5 cm hypermetabolic mass in the central right middle lobe, which shows involvement of the right hilum and mediastinum. This is suspicious for site of primary bronchogenic carcinoma. Postobstructive pneumonitis in the anterior right middle lobe obscures a  1.5 cm right middle lobe nodule in this region which was better seen on previous CT. Metastatic lymphadenopathy throughout the mediastinum, right hilum, right supraclavicular region, and left jugular level-II lymph node. Moderate to large right pleural effusion, likely malignant. Diffuse liver metastases and probable small bilateral adrenal metastases. Diffuse bone metastases. 3.3 cm infrarenal abdominal aortic aneurysm incidentally noted. Electronically Signed   By: Earle Gell M.D.   On: 08/12/2015 12:57   Ir Fluoro Guide Cv Line Left  08/05/2015  INDICATION: 82 year old female with a history of small cell lung carcinoma EXAM: IMPLANTED PORT A CATH PLACEMENT WITH ULTRASOUND AND FLUOROSCOPIC GUIDANCE  MEDICATIONS: 1 mg vancomycin; The antibiotic was administered within an appropriate time interval prior to skin puncture. ANESTHESIA/SEDATION: Moderate (conscious) sedation was employed during this procedure. A total of Versed 2.0 mg and Fentanyl 100 mcg was administered intravenously. Moderate Sedation Time: 53 minutes. The patient's level of consciousness and vital signs were monitored continuously by radiology nursing throughout the procedure under my direct supervision. FLUOROSCOPY TIME:  Five minutes, 0 seconds (962 mGy) COMPLICATIONS: None PROCEDURE: The procedure, risks, benefits, and alternatives were explained to the patient. Questions regarding the procedure were encouraged and answered. The patient understands and consents to the procedure. Ultrasound survey was performed with images stored and sent to PACs. The right neck and chest was prepped with chlorhexidine, and draped in the usual sterile fashion using maximum barrier technique (cap and mask, sterile gown, sterile gloves, large sterile sheet, Schuler hygiene and cutaneous antiseptic). Antibiotic prophylaxis was provided with 1.0g vancomycin administered IV one hour prior to skin incision. Local anesthesia was attained by infiltration with 1% lidocaine without epinephrine. Ultrasound survey of the right neck demonstrated occlusion of the right internal jugular vein. The skin and subcutaneous tissues were infiltrated 1% lidocaine, and an attempt was made to access the base of the right external jugular vein. This was discovered to also be thrombosed. The left neck and chest were then prepped with chlorhexidine and draped in the usual sterile fashion using maximum barrier technique. Ultrasound demonstrated patency of the left internal jugular vein, and this was documented with an image. Under real-time ultrasound guidance, this vein was accessed with a 21 gauge micropuncture needle and image documentation was performed. A small dermatotomy was made at  the access site with an 11 scalpel. A 0.018" wire was advanced into the brachycephalic vein. The access needle exchanged for a 19F micropuncture vascular sheath. Given the course of the 018 wire into the right brachycephalic vein, a limited venogram was performed. An 015 wire was then advanced through the sheath into the brachycephalic vein a 5 French sheath was placed at the puncture left internal jugular vein. Angled multipurpose catheter and the 035 wire were then used in an attempt to direct the wire into the inferior vena cava. Given the distorted anatomy at the right hilum, the angle approach through the inferior cavoatrial junction was sub optimal. The length of the internal catheter was then estimated by withdrawing the 035 wire through the Kumpe the catheter, with the targeted anatomy at the distal brachycephalic vein, and horizontal course into the superior cavoatrial junction. The wire was removed and the Kumpe the catheter remain to the 5 [place] sheath. An appropriate location for the subcutaneous reservoir was selected below the clavicle and an incision was made through the skin and underlying soft tissues. The subcutaneous tissues were then dissected using a combination of blunt and sharp surgical technique and a pocket was formed. A single lumen  power injectable portacatheter was then tunneled through the subcutaneous tissues from the pocket to the dermatotomy and the port reservoir placed within the subcutaneous pocket. The venous access site was then serially dilated and a peel away vascular sheath placed over the 035 wire. The wire was removed and the port catheter advanced into position under fluoroscopic guidance. The catheter tip was positioned in the horizontal portion of the brachycephalic vein. This was documented with a spot image. The port catheter was then tested and found to flush and aspirate well. The port was flushed with saline followed by 100 units/mL heparinized saline. The pocket  was then closed in two layers using first subdermal inverted interrupted absorbable sutures followed by a running subcuticular suture. The epidermis was then sealed with Dermabond. The dermatotomy at the venous access site was also seal with Dermabond. Patient tolerated the procedure well and remained hemodynamically stable throughout. 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 Patent left internal jugular vein. Distortion of the right hilar anatomy, with engorged left brachycephalic vein. Final image demonstrates port catheter in position with the tip of the catheter at the distal brachial cephalic vein. The tip is within the horizontal segment of the vein (AP dimension), before its confluence with the superior cavoatrial junction. IMPRESSION: Status post left IJ port catheter placement. Catheter ready for use. Signed, Dulcy Fanny. Earleen Newport, DO [facility] Electronically Signed   By: Corrie Mckusick D.O.   On: 08/05/2015 12:17   Ir US Guide Vasc Access Left  08/05/2015  INDICATION: 82 year old female with a history of small cell lung carcinoma EXAM: IMPLANTED PORT A CATH PLACEMENT WITH ULTRASOUND AND FLUOROSCOPIC GUIDANCE MEDICATIONS: 1 mg vancomycin; The antibiotic was administered within an appropriate time interval prior to skin puncture. ANESTHESIA/SEDATION: Moderate (conscious) sedation was employed during this procedure. A total of Versed 2.0 mg and Fentanyl 100 mcg was administered intravenously. Moderate Sedation Time: 53 minutes. The patient's level of consciousness and vital signs were monitored continuously by radiology nursing throughout the procedure under my direct supervision. FLUOROSCOPY TIME:  Five minutes, 0 seconds (096 mGy) COMPLICATIONS: None PROCEDURE: The procedure, risks, benefits, and alternatives were explained to the patient. Questions  regarding the procedure were encouraged and answered. The patient understands and consents to the procedure. Ultrasound survey was performed with images stored and sent to PACs. The right neck and chest was prepped with chlorhexidine, and draped in the usual sterile fashion using maximum barrier technique (cap and mask, sterile gown, sterile gloves, large sterile sheet, Kovaleski hygiene and cutaneous antiseptic). Antibiotic prophylaxis was provided with 1.0g vancomycin administered IV one hour prior to skin incision. Local anesthesia was attained by infiltration with 1% lidocaine without epinephrine. Ultrasound survey of the right neck demonstrated occlusion of the right internal jugular vein. The skin and subcutaneous tissues were infiltrated 1% lidocaine, and an attempt was made to access the base of the right external jugular vein. This was discovered to also be thrombosed. The left neck and chest were then prepped with chlorhexidine and draped in the usual sterile fashion using maximum barrier technique. Ultrasound demonstrated patency of the left internal jugular vein, and this was documented with an image. Under real-time ultrasound guidance, this vein was accessed with a 21 gauge micropuncture needle and image documentation was performed. A small dermatotomy was made at the access site with an 11 scalpel. A 0.018" wire was advanced into the brachycephalic vein. The  access needle exchanged for a 48F micropuncture vascular sheath. Given the course of the 018 wire into the right brachycephalic vein, a limited venogram was performed. An 015 wire was then advanced through the sheath into the brachycephalic vein a 5 French sheath was placed at the puncture left internal jugular vein. Angled multipurpose catheter and the 035 wire were then used in an attempt to direct the wire into the inferior vena cava. Given the distorted anatomy at the right hilum, the angle approach through the inferior cavoatrial junction was sub  optimal. The length of the internal catheter was then estimated by withdrawing the 035 wire through the Kumpe the catheter, with the targeted anatomy at the distal brachycephalic vein, and horizontal course into the superior cavoatrial junction. The wire was removed and the Kumpe the catheter remain to the 5 [place] sheath. An appropriate location for the subcutaneous reservoir was selected below the clavicle and an incision was made through the skin and underlying soft tissues. The subcutaneous tissues were then dissected using a combination of blunt and sharp surgical technique and a pocket was formed. A single lumen power injectable portacatheter was then tunneled through the subcutaneous tissues from the pocket to the dermatotomy and the port reservoir placed within the subcutaneous pocket. The venous access site was then serially dilated and a peel away vascular sheath placed over the 035 wire. The wire was removed and the port catheter advanced into position under fluoroscopic guidance. The catheter tip was positioned in the horizontal portion of the brachycephalic vein. This was documented with a spot image. The port catheter was then tested and found to flush and aspirate well. The port was flushed with saline followed by 100 units/mL heparinized saline. The pocket was then closed in two layers using first subdermal inverted interrupted absorbable sutures followed by a running subcuticular suture. The epidermis was then sealed with Dermabond. The dermatotomy at the venous access site was also seal with Dermabond. Patient tolerated the procedure well and remained hemodynamically stable throughout. 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 Patent left internal jugular vein. Distortion of the right hilar anatomy, with engorged left brachycephalic vein. Final image demonstrates port catheter in position with the  tip of the catheter at the distal brachial cephalic vein. The tip is within the horizontal segment of the vein (AP dimension), before its confluence with the superior cavoatrial junction. IMPRESSION: Status post left IJ port catheter placement. Catheter ready for use. Signed, Dulcy Fanny. Earleen Newport, DO [facility] Radiology Electronically Signed   By: Corrie Mckusick D.O.   On: 08/05/2015 12:17   Dg Chest Port 1 View  08/23/2015  CLINICAL DATA:  Chronic dyspnea. H/o COPD and metastatic lung cancer. Former smoker. EXAM: PORTABLE CHEST 1 VIEW COMPARISON:  07/20/2015 FINDINGS: There is right greater than left hazy lung base opacity. This obscures the right hemidiaphragm. Remainder of the lungs show interstitial thickening. There is some hazy airspace opacity in the right mid lung. No pneumothorax. Cardiac silhouette is mostly obscured by the diaphragms. No gross mediastinal or hilar masses. Left anterior chest wall Port-A-Cath has its tip in the lower superior vena cava. Bony thorax is demineralized but grossly intact. IMPRESSION: 1. Right greater than left pleural effusions. Interstitial thickening and central right lung hazy airspace opacity. Findings consistent with pulmonary edema likely on the basis of mild congestive heart failure. No convincing pneumonia. Electronically Signed   By: Shanon Brow  Ormond M.D.   On: 08/23/2015 20:25     Assessment/Plan   ICD-9-CM ICD-10-CM   1. COPD with chronic bronchitis (HCC) 491.20 J44.9   2. C. difficile diarrhea - resolving 008.45 A04.7   3. Drug-induced neutropenia (HCC) - improved 288.03 D70.2    E980.5    4. Chronic diastolic heart failure (HCC) 428.32 I50.32   5. Essential hypertension 401.9 I10   6. Oxygen dependent - due to #1 and #9 V46.2 Z99.81   7. Hypoalbuminemia due to protein-calorie malnutrition (HCC) 263.9 E46   8. Seasonal and perennial allergic rhinitis 477.9 J30.9   9. Small cell carcinoma of right lung (HCC)  162.9 C34.91     Complete abx vanco soln - needs total 14 days  Complete prednisone taper. She is on chronic '10mg'$  daily prednisone  Rx albuterol neb q6hrs for COPD  rx saline nasal spray to keep nose moist. No need for flonase due to prednisone use  PT/OT/ST as ordered  Cont contact isolation  F/u with oncology as scheduled  Nutritional supplements as indicated  GOAL: short term rehab and d/c home when medically appropriate. Communicated with pt and nursing.  Will follow  Kassandra Meriweather S. Perlie Gold  [facility] [address] [place], [place] [address] [phone] Cell (Monday-Friday 8 AM - 5 PM) [phone] After 5 PM and follow prompts

## 2015-09-02 NOTE — Telephone Encounter (Signed)
-----   Message from Ardeen Garland, RN sent at 09/02/2015 10:33 AM EDT ----- Regarding: FW: appt 4/18 Will you let family and rehab know ----- Message -----    From: Curt Bears, MD    Sent: 08/31/2015   1:52 PM      To: Ardeen Garland, RN Subject: RE: appt 4/18                                  No please keep the 4/18 appointment for follow up. ----- Message -----    From: Ardeen Garland, RN    Sent: 08/30/2015  12:52 PM      To: Lucile Crater, RN, Curt Bears, MD Subject: appt 4/18                                      Discharged today to [facility] and family asking about keeping appt 4/18  And next  plan. She is currently being treated for C diff.   Do you want to r/s her appt on 4/18? If so when ?

## 2015-09-02 NOTE — Telephone Encounter (Signed)
Meghan Castro who advised pt still at [facility] for Rehab, they will transport pt and family will meet her here

## 2015-09-03 ENCOUNTER — Telehealth: Payer: Self-pay | Admitting: Internal Medicine

## 2015-09-03 ENCOUNTER — Encounter: Payer: Self-pay | Admitting: *Deleted

## 2015-09-03 ENCOUNTER — Encounter: Payer: Self-pay | Admitting: Internal Medicine

## 2015-09-03 ENCOUNTER — Encounter: Payer: Medicare Other | Admitting: Nutrition

## 2015-09-03 ENCOUNTER — Ambulatory Visit: Payer: Medicare Other

## 2015-09-03 ENCOUNTER — Ambulatory Visit (HOSPITAL_BASED_OUTPATIENT_CLINIC_OR_DEPARTMENT_OTHER): Payer: Medicare Other | Admitting: Internal Medicine

## 2015-09-03 ENCOUNTER — Other Ambulatory Visit (HOSPITAL_BASED_OUTPATIENT_CLINIC_OR_DEPARTMENT_OTHER): Payer: Medicare Other

## 2015-09-03 VITALS — BP 145/67 | HR 93 | Temp 98.2°F | Resp 17 | Ht 62.0 in | Wt 157.2 lb

## 2015-09-03 DIAGNOSIS — A047 Enterocolitis due to Clostridium difficile: Secondary | ICD-10-CM

## 2015-09-03 DIAGNOSIS — R0602 Shortness of breath: Secondary | ICD-10-CM

## 2015-09-03 DIAGNOSIS — C3491 Malignant neoplasm of unspecified part of right bronchus or lung: Secondary | ICD-10-CM

## 2015-09-03 DIAGNOSIS — C3411 Malignant neoplasm of upper lobe, right bronchus or lung: Secondary | ICD-10-CM

## 2015-09-03 DIAGNOSIS — C787 Secondary malignant neoplasm of liver and intrahepatic bile duct: Secondary | ICD-10-CM

## 2015-09-03 DIAGNOSIS — Z5111 Encounter for antineoplastic chemotherapy: Secondary | ICD-10-CM | POA: Insufficient documentation

## 2015-09-03 DIAGNOSIS — A0472 Enterocolitis due to Clostridium difficile, not specified as recurrent: Secondary | ICD-10-CM

## 2015-09-03 LAB — COMPREHENSIVE METABOLIC PANEL
ALBUMIN: 2.7 g/dL — AB (ref 3.5–5.0)
ALK PHOS: 253 U/L — AB (ref 40–150)
ALT: 51 U/L (ref 0–55)
AST: 54 U/L — AB (ref 5–34)
Anion Gap: 10 mEq/L (ref 3–11)
BILIRUBIN TOTAL: 0.54 mg/dL (ref 0.20–1.20)
BUN: 19.5 mg/dL (ref 7.0–26.0)
CALCIUM: 8.8 mg/dL (ref 8.4–10.4)
CO2: 31 mEq/L — ABNORMAL HIGH (ref 22–29)
CREATININE: 0.7 mg/dL (ref 0.6–1.1)
Chloride: 94 mEq/L — ABNORMAL LOW (ref 98–109)
EGFR: 75 mL/min/{1.73_m2} — ABNORMAL LOW (ref >90)
GLUCOSE: 91 mg/dL (ref 70–140)
Potassium: 3.4 mEq/L — ABNORMAL LOW (ref 3.5–5.1)
SODIUM: 135 meq/L — AB (ref 136–145)
TOTAL PROTEIN: 6.2 g/dL — AB (ref 6.4–8.3)

## 2015-09-03 LAB — CBC WITH DIFFERENTIAL/PLATELET
BASO%: 0.3 % (ref 0.0–2.0)
Basophils Absolute: 0.1 10*3/uL (ref 0.0–0.1)
EOS%: 0 % (ref 0.0–7.0)
Eosinophils Absolute: 0 10*3/uL (ref 0.0–0.5)
HEMATOCRIT: 27.8 % — AB (ref 34.8–46.6)
HEMOGLOBIN: 9.5 g/dL — AB (ref 11.6–15.9)
LYMPH#: 1.9 10*3/uL (ref 0.9–3.3)
LYMPH%: 6.8 % — ABNORMAL LOW (ref 14.0–49.7)
MCH: 29.6 pg (ref 25.1–34.0)
MCHC: 34.2 g/dL (ref 31.5–36.0)
MCV: 86.6 fL (ref 79.5–101.0)
MONO#: 2.8 10*3/uL — AB (ref 0.1–0.9)
MONO%: 9.8 % (ref 0.0–14.0)
NEUT%: 83.1 % — AB (ref 38.4–76.8)
NEUTROS ABS: 23.7 10*3/uL — AB (ref 1.5–6.5)
NRBC: 1 % — AB (ref 0–0)
Platelets: 342 10*3/uL (ref 145–400)
RBC: 3.21 10*6/uL — ABNORMAL LOW (ref 3.70–5.45)
RDW: 15.5 % — AB (ref 11.2–14.5)
WBC: 28.5 10*3/uL — ABNORMAL HIGH (ref 3.9–10.3)

## 2015-09-03 NOTE — Progress Notes (Signed)
Oncology Nurse Navigator Documentation  Oncology Nurse Navigator Flowsheets 09/03/2015  Navigator Location [facility]  Navigator Encounter Type Clinic/[facility]C  Patient Visit Type MedOnc  Treatment Phase Treatment  Barriers/Navigation Needs Education  Education Other  Interventions Education Method  Education Method Verbal  Acuity Level 1  Time Spent with Patient 15   Spoke with patient and her sisters today at [facility].  She currently had cdiff and is getting over this.  She is at a rehab facility to help her with weakness.  Patient and sisters had questions about cdiff.  I helped educate on precautions.  She will have follow up with Dr. [name] in 2 weeks and re-start treatment.

## 2015-09-03 NOTE — Telephone Encounter (Signed)
lvm for pt regarding to April and may appt

## 2015-09-03 NOTE — Progress Notes (Signed)
[place] [phone]   [phone]  OFFICE PROGRESS NOTE  [name], MD [place] [address]  DIAGNOSIS: Extensive stage (T2a, N2, M1b) small cell lung cancer presented with right upper lobe lung mass in addition to mediastinal lymphadenopathy and liver metastases diagnosed in March 2017.  PRIOR THERAPY: None  CURRENT THERAPY: Systemic chemotherapy with carboplatin for AUC of 5 on day 1 and etoposide 100 MG/M2 on days 1, 2 and 3 with Neulasta support on day 4. First cycle will start on 08/06/2015 was reduced dose of carboplatin for AUC of 4 and etoposide 90 MG/M2.  INTERVAL HISTORY: Meghan Castro 82 y.o. female returns to the clinic today for follow-up visit accompanied by several family members.The patient was recently admitted to [facility] with pancytopenia and fatigue. She was also found to have C. difficile diarrhea. She received treatment for her condition and had improvement of her neutropenia. She still on treatment with oral vancomycin for the C. difficile diarrhea. She is currently a resident of skilled nursing facility for rehabilitation. The patient continues to have mild shortness of breath and currently on home oxygen but her breathing had much improved compared to before starting her systemic therapy. She has no cough, chest pain or hemoptysis. She denied having any significant weight loss or night sweats. She denied having any nausea or vomiting, no fever or chills. She was supposed to start cycle #2 of her systemic chemotherapy today.  MEDICAL HISTORY: Past Medical History  Diagnosis Date  . Chronic airway obstruction, not elsewhere classified   . Unspecified sinusitis (chronic)   . Diaphragmatic hernia without mention of obstruction or gangrene   . Macular degeneration (senile) of retina, unspecified   . Other and unspecified hyperlipidemia   . Unspecified essential hypertension   .  Unspecified disorder resulting from impaired renal function   . Mediastinal adenopathy   . Cancer [facility])     metastatic lung cancer  . TIA (transient ischemic attack)   . Small cell carcinoma of right lung ([place]) 07/31/2015    ALLERGIES:  is allergic to other; penicillins; pravastatin sodium; quinolones; and moxifloxacin.  MEDICATIONS:  Current Outpatient Prescriptions  Medication Sig Dispense Refill  . acetaminophen (TYLENOL) 500 MG tablet Take 500 mg by mouth every 6 (six) hours as needed for mild pain or moderate pain. Reported on 08/13/2015    . ALPRAZolam (XANAX) 0.25 MG tablet Take 1 tablet (0.25 mg total) by mouth 3 (three) times daily as needed for anxiety. 10 tablet 0  . aspirin 325 MG EC tablet Take 325 mg by mouth daily.      . furosemide (LASIX) 40 MG tablet Take 1 tablet (40 mg total) by mouth daily. 30 tablet   . Ginger, Zingiber officinalis, (GINGER ROOT) 500 MG CAPS Take 2 capsules by mouth daily. Reported on 08/13/2015    . lidocaine-prilocaine (EMLA) cream Apply 1 application topically as needed. (Patient taking differently: Apply 1 application topically as needed (port). ) 30 g 4  . loratadine (CLARITIN) 10 MG tablet Take 10 mg by mouth daily as needed (pain).     Marland Kitchen losartan (COZAAR) 50 MG tablet Take 50 mg by mouth daily.    Marland Kitchen MELATONIN PO Take 10 mg by mouth daily.    . Multiple Vitamin (MULTIVITAMIN WITH MINERALS) TABS tablet Take 1 tablet by mouth daily.    . Omega-3 Fatty Acids (FISH OIL) 1000 MG CAPS Take 1 capsule by mouth daily.    Marland Kitchen  predniSONE (DELTASONE) 10 MG tablet Take 6-5-4-3-2-1 PO daily, then take 1 tablet PO daily 21 tablet 0  . PROAIR HFA 108 (90 BASE) MCG/ACT inhaler INHALE 2 PUFFS INTO THE LUNGS EVERY 4 (FOUR) HOURS AS NEEDED FOR WHEEZING OR SHORTNESS OF BREATH. 8.5 Inhaler 0  . prochlorperazine (COMPAZINE) 10 MG tablet Take 1 tablet (10 mg total) by mouth every 6 (six) hours as needed for nausea or vomiting. 30 tablet 0  . saccharomyces boulardii  (FLORASTOR) 250 MG capsule Take 1 capsule (250 mg total) by mouth 2 (two) times daily.    . Tiotropium Bromide Monohydrate (SPIRIVA RESPIMAT) 2.5 MCG/ACT AERS Inhale 2 puffs into the lungs daily. 1 Inhaler 12  . traMADol (ULTRAM) 50 MG tablet Take 1 tablet (50 mg total) by mouth every 6 (six) hours as needed. 20 tablet 0  . vancomycin (VANCOCIN) 50 mg/mL oral solution Take 2.5 mLs (125 mg total) by mouth every 6 (six) hours.     No current facility-administered medications for this visit.    SURGICAL HISTORY:  Past Surgical History  Procedure Laterality Date  . Arm fracture    . Cholecystectomy    . Total abdominal hysterectomy    . Bilateral salpingoophorectomy    . Incisional hernia repair      abdominal  . Appendectomy    . Cataract extraction    . Endobronchial ultrasound Bilateral 07/24/2015    Procedure: ENDOBRONCHIAL ULTRASOUND;  Surgeon: [name], MD;  Location: [facility];  Service: Cardiopulmonary;  Laterality: Bilateral;  . 2-d echocardiogram      REVIEW OF SYSTEMS:  Constitutional: positive for fatigue Eyes: negative Ears, nose, mouth, throat, and face: negative Respiratory: positive for dyspnea on exertion Cardiovascular: negative Gastrointestinal: positive for diarrhea Genitourinary:negative Integument/breast: negative Hematologic/lymphatic: negative Musculoskeletal:negative Neurological: negative Behavioral/Psych: negative Endocrine: negative Allergic/Immunologic: negative   PHYSICAL EXAMINATION: General appearance: alert, cooperative, fatigued and no distress Head: Normocephalic, without obvious abnormality, atraumatic Neck: no adenopathy, no JVD, supple, symmetrical, trachea midline and thyroid not enlarged, symmetric, no tenderness/mass/nodules Lymph nodes: Cervical, supraclavicular, and axillary nodes normal. Resp: wheezes bilaterally Back: symmetric, no curvature. ROM normal. No CVA tenderness. Cardio: regular rate and rhythm, S1, S2 normal,  no murmur, click, rub or gallop and normal apical impulse GI: soft, non-tender; bowel sounds normal; no masses,  no organomegaly Extremities: extremities normal, atraumatic, no cyanosis or edema Neurologic: Alert and oriented X 3, normal strength and tone. Normal symmetric reflexes. Normal coordination and gait  ECOG PERFORMANCE STATUS: 1 - Symptomatic but completely ambulatory  Blood pressure 145/67, pulse 93, temperature 98.2 F (36.8 C), temperature source Oral, resp. rate 17, height '5\' 2"'$  (1.575 m), weight 157 lb 3.2 oz (71.305 kg), SpO2 97 %.  LABORATORY DATA: Lab Results  Component Value Date   WBC 28.5* 09/03/2015   HGB 9.5* 09/03/2015   HCT 27.8* 09/03/2015   MCV 86.6 09/03/2015   PLT 342 09/03/2015      Chemistry      Component Value Date/Time   NA 135* 09/03/2015 1053   NA 138 08/28/2015 0600   K 3.4* 09/03/2015 1053   K 3.8 08/28/2015 0600   CL 100* 08/28/2015 0600   CO2 31* 09/03/2015 1053   CO2 33* 08/28/2015 0600   BUN 19.5 09/03/2015 1053   BUN 14 08/28/2015 0600   CREATININE 0.7 09/03/2015 1053   CREATININE 0.51 08/28/2015 0600      Component Value Date/Time   CALCIUM 8.8 09/03/2015 1053   CALCIUM 8.2* 08/28/2015 0600  ALKPHOS 253* 09/03/2015 1053   ALKPHOS 163* 08/24/2015 0450   AST 54* 09/03/2015 1053   AST 32 08/24/2015 0450   ALT 51 09/03/2015 1053   ALT 48 08/24/2015 0450   BILITOT 0.54 09/03/2015 1053   BILITOT 0.8 08/24/2015 0450       RADIOGRAPHIC STUDIES: Dg Chest 2 View  08/29/2015  CLINICAL DATA:  Cough and shortness of breath. Subsequent encounter. EXAM: CHEST  2 VIEW COMPARISON:  08/27/2015 FINDINGS: There is decreased opacity at the right lung base likely from a combination of decreased pleural fluid and atelectasis. Left pleural fluid and atelectasis are similar to the prior exam. Irregular interstitial thickening with patchy areas of hazy airspace opacity, most noted in the mid and lower lungs, is similar to the prior exam. There  are no new areas of lung opacity. Left anterior chest wall Port-A-Cath is stable. IMPRESSION: 1. Mild improvement with decreased opacity at the right lung base from the decrease in the size of the right pleural effusion and decreased atelectasis. 2. No other convincing change. Persistent left pleural effusion with atelectasis. Persistent irregular interstitial thickening and more subtle areas of mid to lower lung zone hazy airspace opacity. Combination of findings suggests congestive heart failure. Electronically Signed   By: Lajean Manes M.D.   On: 08/29/2015 15:19   Dg Chest 2 View  08/27/2015  CLINICAL DATA:  Short of breath.  Lung cancer and COPD. EXAM: CHEST  2 VIEW COMPARISON:  08/25/2015 FINDINGS: Lateral view degraded by patient arm position and obliquity. A left Port-A-Cath terminates at the low SVC or cavoatrial junction. Midline trachea. Normal heart size for level of inspiration. Small right pleural effusion is similar, given differences in technique. No pneumothorax. Interstitial edema is mild, slightly improved. Improved right base aeration. Left hemidiaphragm elevation. Patchy right upper lobe opacity remains. IMPRESSION: Overall improved aeration, with decreased interstitial edema and right base airspace disease. Small right pleural effusion and patchy right upper lobe opacity remains. Electronically Signed   By: Abigail Miyamoto M.D.   On: 08/27/2015 12:09   Dg Chest 2 View  08/25/2015  CLINICAL DATA:  Followup CHF and bilateral pleural effusions. Current history of right middle lobe lung cancer. EXAM: CHEST  2 VIEW COMPARISON:  08/23/2015 and earlier, including PET-CT 08/12/2015 and CT chest 07/20/2015. FINDINGS: Cardiac silhouette upper normal in size slightly enlarged, unchanged. Chronic elevation of the left hemidiaphragm, unchanged. Right middle lobe mass extending into the right hilum less conspicuous than on the prior PET-CT and chest CT. No significant change in the mild interstitial  pulmonary edema and the moderately large right pleural effusion. Improved aeration in the right middle lobe and right lower lobe with moderate passive atelectasis persisting. No new pulmonary parenchymal abnormalities. Left jugular Port-A-Cath tip remains in the mid SVC. Exaggeration of the usual thoracic kyphosis as noted previously. IMPRESSION: 1. Stable mild diffuse interstitial pulmonary edema and stable moderate size right pleural effusion. 2. Improved aeration in the right middle lobe and right lower lobe, though moderate passive atelectasis persists. 3. No new abnormalities. Electronically Signed   By: Evangeline Dakin M.D.   On: 08/25/2015 10:36   Nm Pet Image Initial (pi) Skull Base To Thigh  08/12/2015  CLINICAL DATA:  Initial treatment strategy for right small cell lung carcinoma. EXAM: NUCLEAR MEDICINE PET SKULL BASE TO THIGH TECHNIQUE: 8.2 mCi F-18 FDG was injected intravenously. Full-ring PET imaging was performed from the skull base to thigh after the radiotracer. CT data was obtained and used for  attenuation correction and anatomic localization. FASTING BLOOD GLUCOSE:  Value: 87 mg/dl COMPARISON:  CT on 07/22/2012 FINDINGS: NECK 11 mm hypermetabolic left jugular level 2 lymph node is seen which measures 11 mm on image 22 of series 4, and has SUV max of 16.3. Right supraclavicular lymphadenopathy is seen measuring 2.0 cm on image 36/series 4 which has SUV max of 21.3. CHEST A dominant hypermetabolic mass is seen in the central right middle lobe which shows involvement of both the right hilum and mediastinum. This measures 5.0 x 5.2 cm on image 63/series 4 and has SUV max of 25.7. This is suspicious for a primary bronchogenic carcinoma. There is peripheral hypermetabolic opacity in the anterior right middle lobe most likely due to postobstructive pneumonitis. This obscures visualization of a previously seen 1.5 cm nodule at this location on previous CT. Bulky hypermetabolic mediastinal  lymphadenopathy is seen in the prevascular, right paratracheal and subcarinal regions. Index area of lymphadenopathy in the right paratracheal region measures 3.4 x 4.8 cm on image 49/series 4 and has SUV max of 28.3. Mild hypermetabolic right hilar lymphadenopathy is also seen. A moderate to large right pleural effusion is seen with a focus of hypermetabolic activity in the right posterior pleural space, suggesting a malignant pleural effusion. ABDOMEN/PELVIS Diffusely increased hypermetabolic activity is seen throughout the liver corresponding with innumerable diffuse low-attenuation liver lesions, consistent with diffuse hepatic metastases. SUV max measured in the right hepatic lobe is 23.8. Small bilateral adrenal nodules are also seen showing hypermetabolic activity, consistent with bilateral adrenal metastases. No hypermetabolic lymphadenopathy identified within the abdomen or pelvis. A 3.3 cm infrarenal abdominal aortic aneurysm is noted. SKELETON Diffuse hypermetabolic bone metastases are seen involving the left mandible, sternum, spine, bilateral scapulae and ribs as well as the pelvis, consistent with diffuse bone metastases. IMPRESSION: Dominant 5 cm hypermetabolic mass in the central right middle lobe, which shows involvement of the right hilum and mediastinum. This is suspicious for site of primary bronchogenic carcinoma. Postobstructive pneumonitis in the anterior right middle lobe obscures a 1.5 cm right middle lobe nodule in this region which was better seen on previous CT. Metastatic lymphadenopathy throughout the mediastinum, right hilum, right supraclavicular region, and left jugular level-II lymph node. Moderate to large right pleural effusion, likely malignant. Diffuse liver metastases and probable small bilateral adrenal metastases. Diffuse bone metastases. 3.3 cm infrarenal abdominal aortic aneurysm incidentally noted. Electronically Signed   By: Earle Gell M.D.   On: 08/12/2015 12:57   Ir  Fluoro Guide Cv Line Left  08/05/2015  INDICATION: 82 year old female with a history of small cell lung carcinoma EXAM: IMPLANTED PORT A CATH PLACEMENT WITH ULTRASOUND AND FLUOROSCOPIC GUIDANCE MEDICATIONS: 1 mg vancomycin; The antibiotic was administered within an appropriate time interval prior to skin puncture. ANESTHESIA/SEDATION: Moderate (conscious) sedation was employed during this procedure. A total of Versed 2.0 mg and Fentanyl 100 mcg was administered intravenously. Moderate Sedation Time: 53 minutes. The patient's level of consciousness and vital signs were monitored continuously by radiology nursing throughout the procedure under my direct supervision. FLUOROSCOPY TIME:  Five minutes, 0 seconds (258 mGy) COMPLICATIONS: None PROCEDURE: The procedure, risks, benefits, and alternatives were explained to the patient. Questions regarding the procedure were encouraged and answered. The patient understands and consents to the procedure. Ultrasound survey was performed with images stored and sent to PACs. The right neck and chest was prepped with chlorhexidine, and draped in the usual sterile fashion using maximum barrier technique (cap and mask, sterile gown, sterile gloves, large  sterile sheet, [place] hygiene and cutaneous antiseptic). Antibiotic prophylaxis was provided with 1.0g vancomycin administered IV one hour prior to skin incision. Local anesthesia was attained by infiltration with 1% lidocaine without epinephrine. Ultrasound survey of the right neck demonstrated occlusion of the right internal jugular vein. The skin and subcutaneous tissues were infiltrated 1% lidocaine, and an attempt was made to access the base of the right external jugular vein. This was discovered to also be thrombosed. The left neck and chest were then prepped with chlorhexidine and draped in the usual sterile fashion using maximum barrier technique. Ultrasound demonstrated patency of the left internal jugular vein, and this was  documented with an image. Under real-time ultrasound guidance, this vein was accessed with a 21 gauge micropuncture needle and image documentation was performed. A small dermatotomy was made at the access site with an 11 scalpel. A 0.018" wire was advanced into the brachycephalic vein. The access needle exchanged for a 66F micropuncture vascular sheath. Given the course of the 018 wire into the right brachycephalic vein, a limited venogram was performed. An 015 wire was then advanced through the sheath into the brachycephalic vein a 5 French sheath was placed at the puncture left internal jugular vein. Angled multipurpose catheter and the 035 wire were then used in an attempt to direct the wire into the inferior vena cava. Given the distorted anatomy at the right hilum, the angle approach through the inferior cavoatrial junction was sub optimal. The length of the internal catheter was then estimated by withdrawing the 035 wire through the Kumpe the catheter, with the targeted anatomy at the distal brachycephalic vein, and horizontal course into the superior cavoatrial junction. The wire was removed and the Kumpe the catheter remain to the 5 [place] sheath. An appropriate location for the subcutaneous reservoir was selected below the clavicle and an incision was made through the skin and underlying soft tissues. The subcutaneous tissues were then dissected using a combination of blunt and sharp surgical technique and a pocket was formed. A single lumen power injectable portacatheter was then tunneled through the subcutaneous tissues from the pocket to the dermatotomy and the port reservoir placed within the subcutaneous pocket. The venous access site was then serially dilated and a peel away vascular sheath placed over the 035 wire. The wire was removed and the port catheter advanced into position under fluoroscopic guidance. The catheter tip was positioned in the horizontal portion of the brachycephalic vein. This was  documented with a spot image. The port catheter was then tested and found to flush and aspirate well. The port was flushed with saline followed by 100 units/mL heparinized saline. The pocket was then closed in two layers using first subdermal inverted interrupted absorbable sutures followed by a running subcuticular suture. The epidermis was then sealed with Dermabond. The dermatotomy at the venous access site was also seal with Dermabond. Patient tolerated the procedure well and remained hemodynamically stable throughout. 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 Patent left internal jugular vein. Distortion of the right hilar anatomy, with engorged left brachycephalic vein. Final image demonstrates port catheter in position with the tip of the catheter at the distal brachial cephalic vein. The tip is within the horizontal segment of the vein (AP dimension), before its confluence with the superior cavoatrial junction. IMPRESSION: Status post left IJ port catheter placement. Catheter ready for use. Signed, Dulcy Fanny. Earleen Newport, DO [facility]  Radiology Electronically Signed   By: Corrie Mckusick D.O.   On: 08/05/2015 12:17   Ir US Guide Vasc Access Left  08/05/2015  INDICATION: 82 year old female with a history of small cell lung carcinoma EXAM: IMPLANTED PORT A CATH PLACEMENT WITH ULTRASOUND AND FLUOROSCOPIC GUIDANCE MEDICATIONS: 1 mg vancomycin; The antibiotic was administered within an appropriate time interval prior to skin puncture. ANESTHESIA/SEDATION: Moderate (conscious) sedation was employed during this procedure. A total of Versed 2.0 mg and Fentanyl 100 mcg was administered intravenously. Moderate Sedation Time: 53 minutes. The patient's level of consciousness and vital signs were monitored continuously by radiology nursing throughout the procedure under my  direct supervision. FLUOROSCOPY TIME:  Five minutes, 0 seconds (741 mGy) COMPLICATIONS: None PROCEDURE: The procedure, risks, benefits, and alternatives were explained to the patient. Questions regarding the procedure were encouraged and answered. The patient understands and consents to the procedure. Ultrasound survey was performed with images stored and sent to PACs. The right neck and chest was prepped with chlorhexidine, and draped in the usual sterile fashion using maximum barrier technique (cap and mask, sterile gown, sterile gloves, large sterile sheet, [place] hygiene and cutaneous antiseptic). Antibiotic prophylaxis was provided with 1.0g vancomycin administered IV one hour prior to skin incision. Local anesthesia was attained by infiltration with 1% lidocaine without epinephrine. Ultrasound survey of the right neck demonstrated occlusion of the right internal jugular vein. The skin and subcutaneous tissues were infiltrated 1% lidocaine, and an attempt was made to access the base of the right external jugular vein. This was discovered to also be thrombosed. The left neck and chest were then prepped with chlorhexidine and draped in the usual sterile fashion using maximum barrier technique. Ultrasound demonstrated patency of the left internal jugular vein, and this was documented with an image. Under real-time ultrasound guidance, this vein was accessed with a 21 gauge micropuncture needle and image documentation was performed. A small dermatotomy was made at the access site with an 11 scalpel. A 0.018" wire was advanced into the brachycephalic vein. The access needle exchanged for a 79F micropuncture vascular sheath. Given the course of the 018 wire into the right brachycephalic vein, a limited venogram was performed. An 015 wire was then advanced through the sheath into the brachycephalic vein a 5 French sheath was placed at the puncture left internal jugular vein. Angled multipurpose catheter and the 035 wire  were then used in an attempt to direct the wire into the inferior vena cava. Given the distorted anatomy at the right hilum, the angle approach through the inferior cavoatrial junction was sub optimal. The length of the internal catheter was then estimated by withdrawing the 035 wire through the Kumpe the catheter, with the targeted anatomy at the distal brachycephalic vein, and horizontal course into the superior cavoatrial junction. The wire was removed and the Kumpe the catheter remain to the 5 [place] sheath. An appropriate location for the subcutaneous reservoir was selected below the clavicle and an incision was made through the skin and underlying soft tissues. The subcutaneous tissues were then dissected using a combination of blunt and sharp surgical technique and a pocket was formed. A single lumen power injectable portacatheter was then tunneled through the subcutaneous tissues from the pocket to the dermatotomy and the port reservoir placed within the subcutaneous pocket. The venous access site was then serially dilated and a peel away vascular sheath placed over the 035 wire. The wire was removed and the port catheter advanced into position under fluoroscopic guidance. The  catheter tip was positioned in the horizontal portion of the brachycephalic vein. This was documented with a spot image. The port catheter was then tested and found to flush and aspirate well. The port was flushed with saline followed by 100 units/mL heparinized saline. The pocket was then closed in two layers using first subdermal inverted interrupted absorbable sutures followed by a running subcuticular suture. The epidermis was then sealed with Dermabond. The dermatotomy at the venous access site was also seal with Dermabond. Patient tolerated the procedure well and remained hemodynamically stable throughout. No complications encountered and no significant blood loss encountered FINDINGS: Occlusion of the right jugular vein  discovered with ultrasound. Patent left internal jugular vein. Distortion of the right hilar anatomy, with engorged left brachycephalic vein. Final image demonstrates port catheter in position with the tip of the catheter at the distal brachial cephalic vein. The tip is within the horizontal segment of the vein (AP dimension), before its confluence with the superior cavoatrial junction. IMPRESSION: Status post left IJ port catheter placement. Catheter ready for use. Signed, Dulcy Fanny. Earleen Newport, DO [facility] Radiology Electronically Signed   By: Corrie Mckusick D.O.   On: 08/05/2015 12:17   Dg Chest Port 1 View  08/23/2015  CLINICAL DATA:  Chronic dyspnea. H/o COPD and metastatic lung cancer. Former smoker. EXAM: PORTABLE CHEST 1 VIEW COMPARISON:  07/20/2015 FINDINGS: There is right greater than left hazy lung base opacity. This obscures the right hemidiaphragm. Remainder of the lungs show interstitial thickening. There is some hazy airspace opacity in the right mid lung. No pneumothorax. Cardiac silhouette is mostly obscured by the diaphragms. No gross mediastinal or hilar masses. Left anterior chest wall Port-A-Cath has its tip in the lower superior vena cava. Bony thorax is demineralized but grossly intact. IMPRESSION: 1. Right greater than left pleural effusions. Interstitial thickening and central right lung hazy airspace opacity. Findings consistent with pulmonary edema likely on the basis of mild congestive heart failure. No convincing pneumonia. Electronically Signed   By: Lajean Manes M.D.   On: 08/23/2015 20:25    ASSESSMENT AND PLAN: This is a very pleasant 82 years old white female recently diagnosed with extensive stage small cell lung cancer with liver metastasis. The patient was started on systemic chemotherapy with reduced dose carboplatin and etoposide status post 1 cycle. She tolerated the first cycle of her treatment fairly well. She is currently  under treatment for C. difficile diarrhea with oral vancomycin. She is feeling fine and continues to have 1-2 episodes of diarrhea on daily basis. I recommended for the patient to delay the start of cycle #2 by 2 more weeks until improvement of her condition. The patient would come back for follow-up visit in 2 weeks for reevaluation before starting cycle #2 her treatment. She was advised to call immediately if she has any concerning symptoms in the interval. The patient voices understanding of current disease status and treatment options and is in agreement with the current care plan.  All questions were answered. The patient knows to call the clinic with any problems, questions or concerns. We can certainly see the patient much sooner if necessary.  Disclaimer: This note was dictated with voice recognition software. Similar sounding words can inadvertently be transcribed and may not be corrected upon review.

## 2015-09-04 ENCOUNTER — Ambulatory Visit: Payer: Medicare Other

## 2015-09-05 ENCOUNTER — Ambulatory Visit: Payer: Medicare Other

## 2015-09-05 ENCOUNTER — Other Ambulatory Visit: Payer: Self-pay | Admitting: Medical Oncology

## 2015-09-05 ENCOUNTER — Telehealth: Payer: Self-pay

## 2015-09-05 NOTE — Telephone Encounter (Signed)
Family member calling. Pt did not receive chemo this week. Does she still need her Saturday appt. It looks like an injection appt. Per Dr [name] OV note and navigator note: told pt not to come on Saturday. LVM with Diane RN re: is the lab draw on 4/25 necessary since chemo was cancelled?

## 2015-09-07 ENCOUNTER — Ambulatory Visit: Payer: Medicare Other

## 2015-09-10 ENCOUNTER — Other Ambulatory Visit: Payer: Medicare Other

## 2015-09-13 ENCOUNTER — Other Ambulatory Visit: Payer: Self-pay

## 2015-09-13 DIAGNOSIS — C3490 Malignant neoplasm of unspecified part of unspecified bronchus or lung: Secondary | ICD-10-CM

## 2015-09-13 MED ORDER — TRAMADOL HCL 50 MG PO TABS: 50.0000 mg | ORAL_TABLET | Freq: Four times a day (QID) | ORAL | Status: DC | PRN

## 2015-09-13 NOTE — Telephone Encounter (Signed)
Rx sent to [facility]   Phone: [phone] Fax: [phone]

## 2015-09-17 ENCOUNTER — Ambulatory Visit (HOSPITAL_BASED_OUTPATIENT_CLINIC_OR_DEPARTMENT_OTHER): Payer: Medicare Other

## 2015-09-17 ENCOUNTER — Ambulatory Visit (HOSPITAL_BASED_OUTPATIENT_CLINIC_OR_DEPARTMENT_OTHER): Payer: Medicare Other | Admitting: Internal Medicine

## 2015-09-17 ENCOUNTER — Other Ambulatory Visit: Payer: Self-pay

## 2015-09-17 ENCOUNTER — Telehealth: Payer: Self-pay | Admitting: Internal Medicine

## 2015-09-17 ENCOUNTER — Other Ambulatory Visit (HOSPITAL_BASED_OUTPATIENT_CLINIC_OR_DEPARTMENT_OTHER): Payer: Medicare Other

## 2015-09-17 ENCOUNTER — Other Ambulatory Visit: Payer: Self-pay | Admitting: *Deleted

## 2015-09-17 ENCOUNTER — Encounter: Payer: Self-pay | Admitting: Internal Medicine

## 2015-09-17 DIAGNOSIS — C3491 Malignant neoplasm of unspecified part of right bronchus or lung: Secondary | ICD-10-CM

## 2015-09-17 DIAGNOSIS — C3411 Malignant neoplasm of upper lobe, right bronchus or lung: Secondary | ICD-10-CM

## 2015-09-17 DIAGNOSIS — C787 Secondary malignant neoplasm of liver and intrahepatic bile duct: Secondary | ICD-10-CM | POA: Diagnosis not present

## 2015-09-17 DIAGNOSIS — Z5111 Encounter for antineoplastic chemotherapy: Secondary | ICD-10-CM

## 2015-09-17 LAB — CBC WITH DIFFERENTIAL/PLATELET
BASO%: 0.2 % (ref 0.0–2.0)
Basophils Absolute: 0 10*3/uL (ref 0.0–0.1)
EOS ABS: 0.1 10*3/uL (ref 0.0–0.5)
EOS%: 0.7 % (ref 0.0–7.0)
HCT: 33.5 % — ABNORMAL LOW (ref 34.8–46.6)
HGB: 11 g/dL — ABNORMAL LOW (ref 11.6–15.9)
LYMPH%: 13.4 % — AB (ref 14.0–49.7)
MCH: 30.1 pg (ref 25.1–34.0)
MCHC: 32.8 g/dL (ref 31.5–36.0)
MCV: 91.5 fL (ref 79.5–101.0)
MONO#: 1 10*3/uL — AB (ref 0.1–0.9)
MONO%: 9.3 % (ref 0.0–14.0)
NEUT#: 8 10*3/uL — ABNORMAL HIGH (ref 1.5–6.5)
NEUT%: 76.4 % (ref 38.4–76.8)
PLATELETS: 230 10*3/uL (ref 145–400)
RBC: 3.66 10*6/uL — AB (ref 3.70–5.45)
RDW: 19.6 % — ABNORMAL HIGH (ref 11.2–14.5)
WBC: 10.5 10*3/uL — ABNORMAL HIGH (ref 3.9–10.3)
lymph#: 1.4 10*3/uL (ref 0.9–3.3)

## 2015-09-17 LAB — COMPREHENSIVE METABOLIC PANEL
ALT: 66 U/L — ABNORMAL HIGH (ref 0–55)
ANION GAP: 10 meq/L (ref 3–11)
AST: 67 U/L — ABNORMAL HIGH (ref 5–34)
Albumin: 2.9 g/dL — ABNORMAL LOW (ref 3.5–5.0)
Alkaline Phosphatase: 283 U/L — ABNORMAL HIGH (ref 40–150)
BUN: 18.3 mg/dL (ref 7.0–26.0)
CHLORIDE: 97 meq/L — AB (ref 98–109)
CO2: 29 meq/L (ref 22–29)
CREATININE: 0.9 mg/dL (ref 0.6–1.1)
Calcium: 9.4 mg/dL (ref 8.4–10.4)
EGFR: 61 mL/min/{1.73_m2} — ABNORMAL LOW (ref >90)
Glucose: 121 mg/dl (ref 70–140)
Potassium: 4.2 mEq/L (ref 3.5–5.1)
Sodium: 136 mEq/L (ref 136–145)
Total Bilirubin: 0.53 mg/dL (ref 0.20–1.20)
Total Protein: 7 g/dL (ref 6.4–8.3)

## 2015-09-17 MED ORDER — ALPRAZOLAM 0.25 MG PO TABS: 0.2500 mg | ORAL_TABLET | Freq: Three times a day (TID) | ORAL | Status: DC | PRN

## 2015-09-17 MED ORDER — SODIUM CHLORIDE 0.9 % IV SOLN
80.0000 mg/m2 | Freq: Once | INTRAVENOUS | Status: AC
  Administered 2015-09-17: 140 mg via INTRAVENOUS
  Filled 2015-09-17: qty 7

## 2015-09-17 MED ORDER — SODIUM CHLORIDE 0.9 % IV SOLN
Freq: Once | INTRAVENOUS | Status: AC
  Administered 2015-09-17: 13:00:00 via INTRAVENOUS

## 2015-09-17 MED ORDER — SODIUM CHLORIDE 0.9 % IV SOLN
10.0000 mg | Freq: Once | INTRAVENOUS | Status: AC
  Administered 2015-09-17: 10 mg via INTRAVENOUS
  Filled 2015-09-17: qty 1

## 2015-09-17 MED ORDER — SODIUM CHLORIDE 0.9% FLUSH
10.0000 mL | INTRAVENOUS | Status: DC | PRN
Start: 2015-09-17 — End: 2015-09-17
  Administered 2015-09-17: 10 mL
  Filled 2015-09-17: qty 10

## 2015-09-17 MED ORDER — PALONOSETRON HCL INJECTION 0.25 MG/5ML
0.2500 mg | Freq: Once | INTRAVENOUS | Status: AC
  Administered 2015-09-17: 0.25 mg via INTRAVENOUS

## 2015-09-17 MED ORDER — HEPARIN SOD (PORK) LOCK FLUSH 100 UNIT/ML IV SOLN
500.0000 [IU] | Freq: Once | INTRAVENOUS | Status: AC | PRN
  Administered 2015-09-17: 500 [IU]
  Filled 2015-09-17: qty 5

## 2015-09-17 MED ORDER — PALONOSETRON HCL INJECTION 0.25 MG/5ML
INTRAVENOUS | Status: AC
  Filled 2015-09-17: qty 5

## 2015-09-17 MED ORDER — SODIUM CHLORIDE 0.9 % IV SOLN
298.4000 mg | Freq: Once | INTRAVENOUS | Status: AC
  Administered 2015-09-17: 300 mg via INTRAVENOUS
  Filled 2015-09-17: qty 30

## 2015-09-17 NOTE — Progress Notes (Signed)
[facility] [phone]   [phone]  OFFICE PROGRESS NOTE  [name], MD [place] [address]  DIAGNOSIS: Extensive stage (T2a, N2, M1b) small cell lung cancer presented with right upper lobe lung mass in addition to mediastinal lymphadenopathy and liver metastases diagnosed in March 2017.  PRIOR THERAPY: None  CURRENT THERAPY: Systemic chemotherapy with carboplatin for AUC of 4 on day 1 and etoposide 80 MG/M2 on days 1, 2 and 3 with Neulasta support on day 4. First cycle will start on 08/06/2015 was reduced dose of carboplatin for AUC of 4 and etoposide 90 MG/M2. She is status post 1 cycle.  INTERVAL HISTORY: Meghan Castro 82 y.o. female returns to the clinic today for follow-up visit accompanied by several family members. The patient is feeling much better today compared to a few weeks ago. She is still a resident of skilled nursing facility for rehabilitation. Her diarrhea has significantly improved. She had a C. difficile test performed at the skilled nursing facility recently but I don't have the results available yet. The patient continues to have mild shortness of breath and currently on home oxygen but her breathing had much improved compared to before starting her systemic therapy. She has no cough, chest pain or hemoptysis. She denied having any significant weight loss or night sweats. She denied having any nausea or vomiting, no fever or chills. She is here to start cycle #2 of her systemic chemotherapy with carboplatin and etoposide.  MEDICAL HISTORY: Past Medical History  Diagnosis Date  . Chronic airway obstruction, not elsewhere classified   . Unspecified sinusitis (chronic)   . Diaphragmatic hernia without mention of obstruction or gangrene   . Macular degeneration (senile) of retina, unspecified   . Other and unspecified hyperlipidemia   . Unspecified essential hypertension   . Unspecified disorder  resulting from impaired renal function   . Mediastinal adenopathy   . Cancer [facility])     metastatic lung cancer  . TIA (transient ischemic attack)   . Small cell carcinoma of right lung ([place]) 07/31/2015    ALLERGIES:  is allergic to other; penicillins; pravastatin sodium; quinolones; and moxifloxacin.  MEDICATIONS:  Current Outpatient Prescriptions  Medication Sig Dispense Refill  . acetaminophen (TYLENOL) 500 MG tablet Take 500 mg by mouth every 6 (six) hours as needed for mild pain or moderate pain. Reported on 08/13/2015    . ALPRAZolam (XANAX) 0.25 MG tablet Take 1 tablet (0.25 mg total) by mouth 3 (three) times daily as needed for anxiety. 10 tablet 0  . aspirin 325 MG EC tablet Take 325 mg by mouth daily.      . furosemide (LASIX) 40 MG tablet Take 1 tablet (40 mg total) by mouth daily. 30 tablet   . Ginger, Zingiber officinalis, (GINGER ROOT) 500 MG CAPS Take 2 capsules by mouth daily. Reported on 08/13/2015    . lidocaine-prilocaine (EMLA) cream Apply 1 application topically as needed. (Patient taking differently: Apply 1 application topically as needed (port). ) 30 g 4  . loratadine (CLARITIN) 10 MG tablet Take 10 mg by mouth daily as needed (pain).     Marland Kitchen losartan (COZAAR) 50 MG tablet Take 50 mg by mouth daily.    Marland Kitchen MELATONIN PO Take 10 mg by mouth daily.    . Multiple Vitamin (MULTIVITAMIN WITH MINERALS) TABS tablet Take 1 tablet by mouth daily.    . Omega-3 Fatty Acids (FISH OIL) 1000 MG CAPS Take 1 capsule  by mouth daily.    . predniSONE (DELTASONE) 10 MG tablet Take 6-5-4-3-2-1 PO daily, then take 1 tablet PO daily 21 tablet 0  . PROAIR HFA 108 (90 BASE) MCG/ACT inhaler INHALE 2 PUFFS INTO THE LUNGS EVERY 4 (FOUR) HOURS AS NEEDED FOR WHEEZING OR SHORTNESS OF BREATH. 8.5 Inhaler 0  . prochlorperazine (COMPAZINE) 10 MG tablet Take 1 tablet (10 mg total) by mouth every 6 (six) hours as needed for nausea or vomiting. 30 tablet 0  . saccharomyces boulardii (FLORASTOR) 250 MG capsule  Take 1 capsule (250 mg total) by mouth 2 (two) times daily.    . Tiotropium Bromide Monohydrate (SPIRIVA RESPIMAT) 2.5 MCG/ACT AERS Inhale 2 puffs into the lungs daily. 1 Inhaler 12  . traMADol (ULTRAM) 50 MG tablet Take 1 tablet (50 mg total) by mouth every 6 (six) hours as needed. 120 tablet 5  . vancomycin (VANCOCIN) 50 mg/mL oral solution Take 2.5 mLs (125 mg total) by mouth every 6 (six) hours.     No current facility-administered medications for this visit.    SURGICAL HISTORY:  Past Surgical History  Procedure Laterality Date  . Arm fracture    . Cholecystectomy    . Total abdominal hysterectomy    . Bilateral salpingoophorectomy    . Incisional hernia repair      abdominal  . Appendectomy    . Cataract extraction    . Endobronchial ultrasound Bilateral 07/24/2015    Procedure: ENDOBRONCHIAL ULTRASOUND;  Surgeon: [name], MD;  Location: [facility];  Service: Cardiopulmonary;  Laterality: Bilateral;  . 2-d echocardiogram      REVIEW OF SYSTEMS:  Constitutional: positive for fatigue Eyes: negative Ears, nose, mouth, throat, and face: negative Respiratory: positive for dyspnea on exertion Cardiovascular: negative Gastrointestinal: negative Genitourinary:negative Integument/breast: negative Hematologic/lymphatic: negative Musculoskeletal:negative Neurological: negative Behavioral/Psych: negative Endocrine: negative Allergic/Immunologic: negative   PHYSICAL EXAMINATION: General appearance: alert, cooperative, fatigued and no distress Head: Normocephalic, without obvious abnormality, atraumatic Neck: no adenopathy, no JVD, supple, symmetrical, trachea midline and thyroid not enlarged, symmetric, no tenderness/mass/nodules Lymph nodes: Cervical, supraclavicular, and axillary nodes normal. Resp: wheezes bilaterally Back: symmetric, no curvature. ROM normal. No CVA tenderness. Cardio: regular rate and rhythm, S1, S2 normal, no murmur, click, rub or gallop and  normal apical impulse GI: soft, non-tender; bowel sounds normal; no masses,  no organomegaly Extremities: extremities normal, atraumatic, no cyanosis or edema Neurologic: Alert and oriented X 3, normal strength and tone. Normal symmetric reflexes. Normal coordination and gait  ECOG PERFORMANCE STATUS: 1 - Symptomatic but completely ambulatory  There were no vitals taken for this visit.  LABORATORY DATA: Lab Results  Component Value Date   WBC 10.5* 09/17/2015   HGB 11.0* 09/17/2015   HCT 33.5* 09/17/2015   MCV 91.5 09/17/2015   PLT 230 09/17/2015      Chemistry      Component Value Date/Time   NA 136 09/17/2015 1029   NA 138 08/28/2015 0600   K 4.2 09/17/2015 1029   K 3.8 08/28/2015 0600   CL 100* 08/28/2015 0600   CO2 29 09/17/2015 1029   CO2 33* 08/28/2015 0600   BUN 18.3 09/17/2015 1029   BUN 14 08/28/2015 0600   CREATININE 0.9 09/17/2015 1029   CREATININE 0.51 08/28/2015 0600      Component Value Date/Time   CALCIUM 9.4 09/17/2015 1029   CALCIUM 8.2* 08/28/2015 0600   ALKPHOS 283* 09/17/2015 1029   ALKPHOS 163* 08/24/2015 0450   AST 67* 09/17/2015 1029  AST 32 08/24/2015 0450   ALT 66* 09/17/2015 1029   ALT 48 08/24/2015 0450   BILITOT 0.53 09/17/2015 1029   BILITOT 0.8 08/24/2015 0450       RADIOGRAPHIC STUDIES: Dg Chest 2 View  08/29/2015  CLINICAL DATA:  Cough and shortness of breath. Subsequent encounter. EXAM: CHEST  2 VIEW COMPARISON:  08/27/2015 FINDINGS: There is decreased opacity at the right lung base likely from a combination of decreased pleural fluid and atelectasis. Left pleural fluid and atelectasis are similar to the prior exam. Irregular interstitial thickening with patchy areas of hazy airspace opacity, most noted in the mid and lower lungs, is similar to the prior exam. There are no new areas of lung opacity. Left anterior chest wall Port-A-Cath is stable. IMPRESSION: 1. Mild improvement with decreased opacity at the right lung base from the  decrease in the size of the right pleural effusion and decreased atelectasis. 2. No other convincing change. Persistent left pleural effusion with atelectasis. Persistent irregular interstitial thickening and more subtle areas of mid to lower lung zone hazy airspace opacity. Combination of findings suggests congestive heart failure. Electronically Signed   By: Lajean Manes M.D.   On: 08/29/2015 15:19   Dg Chest 2 View  08/27/2015  CLINICAL DATA:  Short of breath.  Lung cancer and COPD. EXAM: CHEST  2 VIEW COMPARISON:  08/25/2015 FINDINGS: Lateral view degraded by patient arm position and obliquity. A left Port-A-Cath terminates at the low SVC or cavoatrial junction. Midline trachea. Normal heart size for level of inspiration. Small right pleural effusion is similar, given differences in technique. No pneumothorax. Interstitial edema is mild, slightly improved. Improved right base aeration. Left hemidiaphragm elevation. Patchy right upper lobe opacity remains. IMPRESSION: Overall improved aeration, with decreased interstitial edema and right base airspace disease. Small right pleural effusion and patchy right upper lobe opacity remains. Electronically Signed   By: Abigail Miyamoto M.D.   On: 08/27/2015 12:09   Dg Chest 2 View  08/25/2015  CLINICAL DATA:  Followup CHF and bilateral pleural effusions. Current history of right middle lobe lung cancer. EXAM: CHEST  2 VIEW COMPARISON:  08/23/2015 and earlier, including PET-CT 08/12/2015 and CT chest 07/20/2015. FINDINGS: Cardiac silhouette upper normal in size slightly enlarged, unchanged. Chronic elevation of the left hemidiaphragm, unchanged. Right middle lobe mass extending into the right hilum less conspicuous than on the prior PET-CT and chest CT. No significant change in the mild interstitial pulmonary edema and the moderately large right pleural effusion. Improved aeration in the right middle lobe and right lower lobe with moderate passive atelectasis persisting.  No new pulmonary parenchymal abnormalities. Left jugular Port-A-Cath tip remains in the mid SVC. Exaggeration of the usual thoracic kyphosis as noted previously. IMPRESSION: 1. Stable mild diffuse interstitial pulmonary edema and stable moderate size right pleural effusion. 2. Improved aeration in the right middle lobe and right lower lobe, though moderate passive atelectasis persists. 3. No new abnormalities. Electronically Signed   By: Evangeline Dakin M.D.   On: 08/25/2015 10:36   Dg Chest Port 1 View  08/23/2015  CLINICAL DATA:  Chronic dyspnea. H/o COPD and metastatic lung cancer. Former smoker. EXAM: PORTABLE CHEST 1 VIEW COMPARISON:  07/20/2015 FINDINGS: There is right greater than left hazy lung base opacity. This obscures the right hemidiaphragm. Remainder of the lungs show interstitial thickening. There is some hazy airspace opacity in the right mid lung. No pneumothorax. Cardiac silhouette is mostly obscured by the diaphragms. No gross mediastinal or hilar masses.  Left anterior chest wall Port-A-Cath has its tip in the lower superior vena cava. Bony thorax is demineralized but grossly intact. IMPRESSION: 1. Right greater than left pleural effusions. Interstitial thickening and central right lung hazy airspace opacity. Findings consistent with pulmonary edema likely on the basis of mild congestive heart failure. No convincing pneumonia. Electronically Signed   By: Lajean Manes M.D.   On: 08/23/2015 20:25    ASSESSMENT AND PLAN: This is a very pleasant 82 years old white female recently diagnosed with extensive stage small cell lung cancer with liver metastasis. The patient was started on systemic chemotherapy with reduced dose carboplatin and etoposide status post 1 cycle. She tolerated the first cycle of her treatment fairly well.  Start of cycle #2 was delayed secondary to C. difficile infection and treatment. She is feeling much better today. I recommended for the patient to proceed with cycle  #2 today. She will continue on reduced dose of carboplatin for AUC of 4 on day 1 and etoposide 80 MG/M2 on days 1, 2 and 3 with Neulasta support on day 4. The patient would come back for follow-up visit in 3 weeks for evaluation after repeating CT scan of the chest, abdomen and pelvis for restaging of her disease. She was advised to call immediately if she has any concerning symptoms in the interval. The patient voices understanding of current disease status and treatment options and is in agreement with the current care plan.  All questions were answered. The patient knows to call the clinic with any problems, questions or concerns. We can certainly see the patient much sooner if necessary.  Disclaimer: This note was dictated with voice recognition software. Similar sounding words can inadvertently be transcribed and may not be corrected upon review.

## 2015-09-17 NOTE — Telephone Encounter (Signed)
per pof to sch pt appt-gave pt copy of avs and gave contrast-adv [facility] will call to sch trmt

## 2015-09-17 NOTE — Patient Instructions (Signed)
[facility] Discharge Instructions for Patients Receiving Chemotherapy  Today you received the following chemotherapy agents Carboplatin/Etoposide.  To help prevent nausea and vomiting after your treatment, we encourage you to take your nausea medication as directed.   If you develop nausea and vomiting that is not controlled by your nausea medication, call the clinic.   BELOW ARE SYMPTOMS THAT SHOULD BE REPORTED IMMEDIATELY:  *FEVER GREATER THAN 100.5 F  *CHILLS WITH OR WITHOUT FEVER  NAUSEA AND VOMITING THAT IS NOT CONTROLLED WITH YOUR NAUSEA MEDICATION  *UNUSUAL SHORTNESS OF BREATH  *UNUSUAL BRUISING OR BLEEDING  TENDERNESS IN MOUTH AND THROAT WITH OR WITHOUT PRESENCE OF ULCERS  *URINARY PROBLEMS  *BOWEL PROBLEMS  UNUSUAL RASH Items with * indicate a potential emergency and should be followed up as soon as possible.  Feel free to call the clinic you have any questions or concerns. The clinic phone number is [phone].  Please show the [facility] at check-in to the Emergency Department and triage nurse.

## 2015-09-17 NOTE — Telephone Encounter (Signed)
Rx Fax to [facility] at [phone], phone number [phone].  [place] [address]

## 2015-09-18 ENCOUNTER — Ambulatory Visit (HOSPITAL_BASED_OUTPATIENT_CLINIC_OR_DEPARTMENT_OTHER): Payer: Medicare Other

## 2015-09-18 VITALS — BP 121/56 | HR 96 | Temp 98.2°F | Resp 18

## 2015-09-18 DIAGNOSIS — C787 Secondary malignant neoplasm of liver and intrahepatic bile duct: Secondary | ICD-10-CM | POA: Diagnosis not present

## 2015-09-18 DIAGNOSIS — C3491 Malignant neoplasm of unspecified part of right bronchus or lung: Secondary | ICD-10-CM

## 2015-09-18 DIAGNOSIS — C3411 Malignant neoplasm of upper lobe, right bronchus or lung: Secondary | ICD-10-CM

## 2015-09-18 DIAGNOSIS — Z5111 Encounter for antineoplastic chemotherapy: Secondary | ICD-10-CM

## 2015-09-18 MED ORDER — HEPARIN SOD (PORK) LOCK FLUSH 100 UNIT/ML IV SOLN
500.0000 [IU] | Freq: Once | INTRAVENOUS | Status: AC | PRN
  Administered 2015-09-18: 500 [IU]
  Filled 2015-09-18: qty 5

## 2015-09-18 MED ORDER — SODIUM CHLORIDE 0.9 % IV SOLN
80.0000 mg/m2 | Freq: Once | INTRAVENOUS | Status: AC
  Administered 2015-09-18: 140 mg via INTRAVENOUS
  Filled 2015-09-18: qty 7

## 2015-09-18 MED ORDER — SODIUM CHLORIDE 0.9 % IV SOLN
Freq: Once | INTRAVENOUS | Status: AC
  Administered 2015-09-18: 12:00:00 via INTRAVENOUS

## 2015-09-18 MED ORDER — SODIUM CHLORIDE 0.9 % IV SOLN
10.0000 mg | Freq: Once | INTRAVENOUS | Status: AC
  Administered 2015-09-18: 10 mg via INTRAVENOUS
  Filled 2015-09-18: qty 1

## 2015-09-18 MED ORDER — SODIUM CHLORIDE 0.9% FLUSH
10.0000 mL | INTRAVENOUS | Status: DC | PRN
  Administered 2015-09-18: 10 mL
  Filled 2015-09-18: qty 10

## 2015-09-18 NOTE — Patient Instructions (Signed)
[facility] Discharge Instructions for Patients Receiving Chemotherapy  Today you received the following chemotherapy agents: Etoposide   To help prevent nausea and vomiting after your treatment, we encourage you to take your nausea medication as directed.    If you develop nausea and vomiting that is not controlled by your nausea medication, call the clinic.   BELOW ARE SYMPTOMS THAT SHOULD BE REPORTED IMMEDIATELY:  *FEVER GREATER THAN 100.5 F  *CHILLS WITH OR WITHOUT FEVER  NAUSEA AND VOMITING THAT IS NOT CONTROLLED WITH YOUR NAUSEA MEDICATION  *UNUSUAL SHORTNESS OF BREATH  *UNUSUAL BRUISING OR BLEEDING  TENDERNESS IN MOUTH AND THROAT WITH OR WITHOUT PRESENCE OF ULCERS  *URINARY PROBLEMS  *BOWEL PROBLEMS  UNUSUAL RASH Items with * indicate a potential emergency and should be followed up as soon as possible.  Feel free to call the clinic you have any questions or concerns. The clinic phone number is [phone].  Please show the CHEMO ALERT CARD at check-in to the Emergency Department and triage nurse.   

## 2015-09-19 ENCOUNTER — Ambulatory Visit: Payer: Medicare Other

## 2015-09-19 ENCOUNTER — Ambulatory Visit (HOSPITAL_BASED_OUTPATIENT_CLINIC_OR_DEPARTMENT_OTHER): Payer: Medicare Other

## 2015-09-19 VITALS — BP 142/72 | HR 104 | Temp 97.3°F | Resp 20

## 2015-09-19 DIAGNOSIS — C3411 Malignant neoplasm of upper lobe, right bronchus or lung: Secondary | ICD-10-CM

## 2015-09-19 DIAGNOSIS — C787 Secondary malignant neoplasm of liver and intrahepatic bile duct: Secondary | ICD-10-CM

## 2015-09-19 DIAGNOSIS — Z5111 Encounter for antineoplastic chemotherapy: Secondary | ICD-10-CM

## 2015-09-19 DIAGNOSIS — C3491 Malignant neoplasm of unspecified part of right bronchus or lung: Secondary | ICD-10-CM

## 2015-09-19 MED ORDER — SODIUM CHLORIDE 0.9 % IV SOLN
Freq: Once | INTRAVENOUS | Status: AC
  Administered 2015-09-19: 12:00:00 via INTRAVENOUS

## 2015-09-19 MED ORDER — SODIUM CHLORIDE 0.9 % IV SOLN
10.0000 mg | Freq: Once | INTRAVENOUS | Status: AC
  Administered 2015-09-19: 10 mg via INTRAVENOUS
  Filled 2015-09-19: qty 1

## 2015-09-19 MED ORDER — SODIUM CHLORIDE 0.9% FLUSH
10.0000 mL | INTRAVENOUS | Status: DC | PRN
  Administered 2015-09-19: 10 mL
  Filled 2015-09-19: qty 10

## 2015-09-19 MED ORDER — HEPARIN SOD (PORK) LOCK FLUSH 100 UNIT/ML IV SOLN
500.0000 [IU] | Freq: Once | INTRAVENOUS | Status: AC | PRN
  Administered 2015-09-19: 500 [IU]
  Filled 2015-09-19: qty 5

## 2015-09-19 MED ORDER — ETOPOSIDE CHEMO INJECTION 1 GM/50ML
80.0000 mg/m2 | Freq: Once | INTRAVENOUS | Status: AC
  Administered 2015-09-19: 140 mg via INTRAVENOUS
  Filled 2015-09-19: qty 7

## 2015-09-19 NOTE — Patient Instructions (Signed)
[facility] Discharge Instructions for Patients Receiving Chemotherapy  Today you received the following chemotherapy agents VP 16 (Etoposide) To help prevent nausea and vomiting after your treatment, we encourage you to take your nausea medication as prescribed.   If you develop nausea and vomiting that is not controlled by your nausea medication, call the clinic.   BELOW ARE SYMPTOMS THAT SHOULD BE REPORTED IMMEDIATELY:  *FEVER GREATER THAN 100.5 F  *CHILLS WITH OR WITHOUT FEVER  NAUSEA AND VOMITING THAT IS NOT CONTROLLED WITH YOUR NAUSEA MEDICATION  *UNUSUAL SHORTNESS OF BREATH  *UNUSUAL BRUISING OR BLEEDING  TENDERNESS IN MOUTH AND THROAT WITH OR WITHOUT PRESENCE OF ULCERS  *URINARY PROBLEMS  *BOWEL PROBLEMS  UNUSUAL RASH Items with * indicate a potential emergency and should be followed up as soon as possible.  Feel free to call the clinic you have any questions or concerns. The clinic phone number is [phone].  Please show the [facility] [place] at check-in to the Emergency Department and triage nurse.

## 2015-09-20 ENCOUNTER — Encounter: Payer: Self-pay | Admitting: Adult Health

## 2015-09-20 ENCOUNTER — Non-Acute Institutional Stay (SKILLED_NURSING_FACILITY): Payer: Medicare Other | Admitting: Adult Health

## 2015-09-20 DIAGNOSIS — J449 Chronic obstructive pulmonary disease, unspecified: Secondary | ICD-10-CM | POA: Diagnosis not present

## 2015-09-20 DIAGNOSIS — Z9981 Dependence on supplemental oxygen: Secondary | ICD-10-CM

## 2015-09-20 DIAGNOSIS — C3491 Malignant neoplasm of unspecified part of right bronchus or lung: Secondary | ICD-10-CM | POA: Diagnosis not present

## 2015-09-20 MED ORDER — ALPRAZOLAM 0.25 MG PO TABS: 0.2500 mg | ORAL_TABLET | Freq: Three times a day (TID) | ORAL | Status: AC | PRN

## 2015-09-20 MED ORDER — TRAMADOL HCL 50 MG PO TABS: 50.0000 mg | ORAL_TABLET | Freq: Four times a day (QID) | ORAL | Status: AC | PRN

## 2015-09-20 NOTE — Progress Notes (Signed)
Patient ID: Meghan Castro, female   DOB: 03/16/1929, 82 y.o.   MRN: [id number]  Facility: [facility]     CODE STATUS: Full Code  Allergies  Allergen Reactions  . Other Shortness Of Breath    *Perfumes*  . Penicillins     Has patient had a PCN reaction causing immediate rash, facial/tongue/throat swelling, SOB or lightheadedness with hypotension: No Has patient had a PCN reaction causing severe rash involving mucus membranes or skin necrosis: No Has patient had a PCN reaction that required hospitalization No Has patient had a PCN reaction occurring within the last 10 years: No If all of the above answers are "NO", then may proceed with Cephalosporin use.   . Pravastatin Sodium     REACTION: swelling in tounge and feet turn black  . Quinolones Other (See Comments)    hyper  . Moxifloxacin Anxiety    Chief Complaint  Patient presents with  . Discharge Note    Discharge from Facility    HPI:  She is being discharged to home with home health for pt/ot/rn. She will need home 02 and will need a standard light weight wheelchair. She will need her prescriptions to be written and will need to follow up with her medical provider.  She had been hospitalized for c-dif and lung cancer. She was admitted to this facility for short term rehab and is now ready to return home.   Past Medical History  Diagnosis Date  . Chronic airway obstruction, not elsewhere classified   . Unspecified sinusitis (chronic)   . Diaphragmatic hernia without mention of obstruction or gangrene   . Macular degeneration (senile) of retina, unspecified   . Other and unspecified hyperlipidemia   . Unspecified essential hypertension   . Unspecified disorder resulting from impaired renal function   . Mediastinal adenopathy   . Cancer [facility])     metastatic lung cancer  . TIA (transient ischemic attack)   . Small cell carcinoma of right lung (HCC) 07/31/2015    Past Surgical History  Procedure Laterality Date  . Arm  fracture    . Cholecystectomy    . Total abdominal hysterectomy    . Bilateral salpingoophorectomy    . Incisional hernia repair      abdominal  . Appendectomy    . Cataract extraction    . Endobronchial ultrasound Bilateral 07/24/2015    Procedure: ENDOBRONCHIAL ULTRASOUND;  Surgeon: Lupita Leash, MD;  Location: [facility];  Service: Cardiopulmonary;  Laterality: Bilateral;  . 2-d echocardiogram      Social History   Social History  . Marital Status: Widowed    Spouse Name: N/A  . Number of Children: N/A  . Years of Education: N/A   Occupational History  . retired Programme researcher, broadcasting/film/video on computer   Social History Main Topics  . Smoking status: Former Smoker    Quit date: 11/14/1989  . Smokeless tobacco: Not on file  . Alcohol Use: No  . Drug Use: No  . Sexual Activity: Not on file   Other Topics Concern  . Not on file   Social History Narrative   Family History  Problem Relation Age of Onset  . Emphysema Father   . Heart disease Mother   . Lung cancer Father     VITAL SIGNS BP 173/108 mmHg  Pulse 96  Temp(Src) 97.9 F (36.6 C) (Oral)  Resp 21  Ht 5\' 2"  (1.575 m)  Wt 154 lb 6 oz (70.024 kg)  BMI 28.23 kg/m2  Patient's Medications  New Prescriptions   No medications on file  Previous Medications   ACETAMINOPHEN (TYLENOL) 500 MG TABLET    Take 500 mg by mouth every 6 (six) hours as needed for mild pain or moderate pain. Reported on 08/13/2015   ALPRAZOLAM (XANAX) 0.25 MG TABLET    Take 1 tablet (0.25 mg total) by mouth 3 (three) times daily as needed for anxiety.   ASPIRIN 325 MG EC TABLET    Take 325 mg by mouth daily.     FUROSEMIDE (LASIX) 40 MG TABLET    Take 1 tablet (40 mg total) by mouth daily.   GINGER, ZINGIBER OFFICINALIS, (GINGER ROOT) 500 MG CAPS    Take 2 capsules by mouth daily. Reported on 08/13/2015   LIDOCAINE-PRILOCAINE (EMLA) CREAM    Apply 1 application topically as needed.   LORATADINE (CLARITIN) 10 MG TABLET    Take 10  mg by mouth daily as needed (pain).    LOSARTAN (COZAAR) 50 MG TABLET    Take 50 mg by mouth daily.   MELATONIN PO    Take 10 mg by mouth daily.   MULTIPLE VITAMIN (MULTIVITAMIN WITH MINERALS) TABS TABLET    Take 1 tablet by mouth daily.   OMEGA-3 FATTY ACIDS (FISH OIL) 1000 MG CAPS    Take 1 capsule by mouth daily.   PROAIR HFA 108 (90 BASE) MCG/ACT INHALER    INHALE 2 PUFFS INTO THE LUNGS EVERY 4 (FOUR) HOURS AS NEEDED FOR WHEEZING OR SHORTNESS OF BREATH.   PROCHLORPERAZINE (COMPAZINE) 10 MG TABLET    Take 1 tablet (10 mg total) by mouth every 6 (six) hours as needed for nausea or vomiting.   SACCHAROMYCES BOULARDII (FLORASTOR) 250 MG CAPSULE    Take 1 capsule (250 mg total) by mouth 2 (two) times daily.   TIOTROPIUM BROMIDE MONOHYDRATE (SPIRIVA RESPIMAT) 2.5 MCG/ACT AERS    Inhale 2 puffs into the lungs daily.   TRAMADOL (ULTRAM) 50 MG TABLET    Take 1 tablet (50 mg total) by mouth every 6 (six) hours as needed.  Modified Medications   No medications on file  Discontinued Medications   PREDNISONE (DELTASONE) 10 MG TABLET    Take 6-5-4-3-2-1 PO daily, then take 1 tablet PO daily   VANCOMYCIN (VANCOCIN) 50 MG/ML ORAL SOLUTION    Take 2.5 mLs (125 mg total) by mouth every 6 (six) hours.     SIGNIFICANT DIAGNOSTIC EXAMS  08-29-15: chest x-ray: 1. Mild improvement with decreased opacity at the right lung base from the decrease in the size of the right pleural effusion and decreased atelectasis. 2. No other convincing change. Persistent left pleural effusion with atelectasis. Persistent irregular interstitial thickening and more subtle areas of mid to lower lung zone hazy airspace opacity. Combination of findings suggests congestive heart failure.   LABS REVIEWED:   09-17-15: wbc 10.5; hgb 11.0; hct 33.;5 mcv 91.5; plt 230; glucose 91; bun 19.5; creat 0.7; k+ 3.4; na++ 135; ast 54; alt 51; alk phos 253; albumin 2.7    Review of Systems  Constitutional: Negative for malaise/fatigue.    Respiratory: Positive for shortness of breath. Negative for cough.        Is 02 dependent   Cardiovascular: Negative for chest pain, palpitations and leg swelling.  Gastrointestinal: Negative for heartburn, abdominal pain and constipation.  Musculoskeletal: Negative for myalgias, back pain and joint pain.  Skin: Negative.   Neurological: Negative for dizziness.  Psychiatric/Behavioral: The patient is not nervous/anxious.  Physical Exam  Constitutional: She is oriented to person, place, and time. No distress.  Eyes: Conjunctivae are normal.  Neck: Neck supple. No JVD present. No thyromegaly present.  Cardiovascular: Normal rate and intact distal pulses.   Murmur heard. Heart rate irreg 1/6 murmur  Respiratory: Effort normal. No respiratory distress. She has no wheezes.  Breath sounds diminished 02 dependent   GI: Soft. Bowel sounds are normal. She exhibits no distension. There is no tenderness.  Musculoskeletal: She exhibits edema.  Able to move all extremities  1+ bilateral lower extremity edema   Lymphadenopathy:    She has no cervical adenopathy.  Neurological: She is alert and oriented to person, place, and time.  Skin: Skin is warm and dry. She is not diaphoretic.  Psychiatric: She has a normal mood and affect.       ASSESSMENT/ PLAN:   Patient is being discharged with the following home health services:  Rn/pt/ot: to evaluate and treat as indicated for medication management; gait strength balance and adl training.   Patient is being discharged with the following durable medical equipment:  She requires a standard light weight wheelchair in order to maintain her current level of independence with her dal's which cannot be achieved with a walker. She requires a light weight wheelchair due to her frailty and she can self propel. She requires home 02 at 2 liters nasal cannula with stationary gaseous and portable gaseous 02 in order to maintain her 02 sats > 90%. Her 02  sats off 02 is 89%.   Patient has been advised to f/u with their PCP in 1-2 weeks to bring them up to date on their rehab stay.  Social services at facility is responsible for arranging this appointment.  Pt was provided with a 30 day supply of prescriptions for medications and refills must be obtained from their PCP.  For controlled substances, a more limited supply may be provided adequate until PCP appointment only. #30 xanax 0.25 mg tabs and #30 ultram 50 mg tabs.    Time spent with patient  45 minutes >50% time spent counseling; reviewing medical record; tests; labs; and developing future plan of care    Ok Edwards NP [facility]  Contact [phone] Monday through Friday 8am- 5pm  After hours call [phone]

## 2015-09-21 ENCOUNTER — Ambulatory Visit (HOSPITAL_BASED_OUTPATIENT_CLINIC_OR_DEPARTMENT_OTHER): Payer: Medicare Other

## 2015-09-21 VITALS — BP 122/71 | HR 107 | Temp 98.8°F | Resp 20

## 2015-09-21 DIAGNOSIS — C3491 Malignant neoplasm of unspecified part of right bronchus or lung: Secondary | ICD-10-CM

## 2015-09-21 DIAGNOSIS — C3411 Malignant neoplasm of upper lobe, right bronchus or lung: Secondary | ICD-10-CM | POA: Diagnosis not present

## 2015-09-21 MED ORDER — PEGFILGRASTIM INJECTION 6 MG/0.6ML ~~LOC~~
6.0000 mg | PREFILLED_SYRINGE | Freq: Once | SUBCUTANEOUS | Status: AC
  Administered 2015-09-21: 6 mg via SUBCUTANEOUS

## 2015-09-21 NOTE — Patient Instructions (Signed)
Pegfilgrastim injection What is this medicine? PEGFILGRASTIM (PEG fil gra stim) is a long-acting granulocyte colony-stimulating factor that stimulates the growth of neutrophils, a type of white blood cell important in the body's fight against infection. It is used to reduce the incidence of fever and infection in patients with certain types of cancer who are receiving chemotherapy that affects the bone marrow, and to increase survival after being exposed to high doses of radiation. This medicine may be used for other purposes; ask your health care provider or pharmacist if you have questions. What should I tell my health care provider before I take this medicine? They need to know if you have any of these conditions: -kidney disease -latex allergy -ongoing radiation therapy -sickle cell disease -skin reactions to acrylic adhesives (On-Body Injector only) -an unusual or allergic reaction to pegfilgrastim, filgrastim, other medicines, foods, dyes, or preservatives -pregnant or trying to get pregnant -breast-feeding How should I use this medicine? This medicine is for injection under the skin. If you get this medicine at home, you will be taught how to prepare and give the pre-filled syringe or how to use the On-body Injector. Refer to the patient Instructions for Use for detailed instructions. Use exactly as directed. Take your medicine at regular intervals. Do not take your medicine more often than directed. It is important that you put your used needles and syringes in a special sharps container. Do not put them in a trash can. If you do not have a sharps container, call your pharmacist or healthcare provider to get one. Talk to your pediatrician regarding the use of this medicine in children. While this drug may be prescribed for selected conditions, precautions do apply. Overdosage: If you think you have taken too much of this medicine contact a poison control center or emergency room at  once. NOTE: This medicine is only for you. Do not share this medicine with others. What if I miss a dose? It is important not to miss your dose. Call your doctor or health care professional if you miss your dose. If you miss a dose due to an On-body Injector failure or leakage, a new dose should be administered as soon as possible using a single prefilled syringe for manual use. What may interact with this medicine? Interactions have not been studied. Give your health care provider a list of all the medicines, herbs, non-prescription drugs, or dietary supplements you use. Also tell them if you smoke, drink alcohol, or use illegal drugs. Some items may interact with your medicine. This list may not describe all possible interactions. Give your health care provider a list of all the medicines, herbs, non-prescription drugs, or dietary supplements you use. Also tell them if you smoke, drink alcohol, or use illegal drugs. Some items may interact with your medicine. What should I watch for while using this medicine? You may need blood work done while you are taking this medicine. If you are going to need a MRI, CT scan, or other procedure, tell your doctor that you are using this medicine (On-Body Injector only). What side effects may I notice from receiving this medicine? Side effects that you should report to your doctor or health care professional as soon as possible: -allergic reactions like skin rash, itching or hives, swelling of the face, lips, or tongue -dizziness -fever -pain, redness, or irritation at site where injected -pinpoint red spots on the skin -red or dark-brown urine -shortness of breath or breathing problems -stomach or side pain, or pain   at the shoulder -swelling -tiredness -trouble passing urine or change in the amount of urine Side effects that usually do not require medical attention (report to your doctor or health care professional if they continue or are  bothersome): -bone pain -muscle pain This list may not describe all possible side effects. Call your doctor for medical advice about side effects. You may report side effects to FDA at 1-800-FDA-1088. Where should I keep my medicine? Keep out of the reach of children. Store pre-filled syringes in a refrigerator between 2 and 8 degrees C (36 and 46 degrees F). Do not freeze. Keep in carton to protect from light. Throw away this medicine if it is left out of the refrigerator for more than 48 hours. Throw away any unused medicine after the expiration date. NOTE: This sheet is a summary. It may not cover all possible information. If you have questions about this medicine, talk to your doctor, pharmacist, or health care provider.    2016, Elsevier/Gold Standard. (2014-05-24 14:30:14)  

## 2015-09-24 ENCOUNTER — Other Ambulatory Visit (HOSPITAL_BASED_OUTPATIENT_CLINIC_OR_DEPARTMENT_OTHER): Payer: Medicare Other

## 2015-09-24 ENCOUNTER — Ambulatory Visit: Payer: Medicare Other | Admitting: Internal Medicine

## 2015-09-24 ENCOUNTER — Telehealth: Payer: Self-pay | Admitting: Internal Medicine

## 2015-09-24 ENCOUNTER — Ambulatory Visit (HOSPITAL_BASED_OUTPATIENT_CLINIC_OR_DEPARTMENT_OTHER): Payer: Medicare Other

## 2015-09-24 ENCOUNTER — Ambulatory Visit (HOSPITAL_BASED_OUTPATIENT_CLINIC_OR_DEPARTMENT_OTHER): Payer: Medicare Other | Admitting: Nurse Practitioner

## 2015-09-24 VITALS — BP 121/56 | HR 101 | Temp 98.0°F | Resp 19 | Wt 148.5 lb

## 2015-09-24 VITALS — BP 121/50 | HR 102 | Temp 97.8°F | Resp 16

## 2015-09-24 DIAGNOSIS — E860 Dehydration: Secondary | ICD-10-CM

## 2015-09-24 DIAGNOSIS — C3491 Malignant neoplasm of unspecified part of right bronchus or lung: Secondary | ICD-10-CM

## 2015-09-24 DIAGNOSIS — R5383 Other fatigue: Secondary | ICD-10-CM

## 2015-09-24 DIAGNOSIS — D6481 Anemia due to antineoplastic chemotherapy: Secondary | ICD-10-CM | POA: Diagnosis not present

## 2015-09-24 DIAGNOSIS — R5381 Other malaise: Secondary | ICD-10-CM

## 2015-09-24 DIAGNOSIS — R918 Other nonspecific abnormal finding of lung field: Secondary | ICD-10-CM

## 2015-09-24 DIAGNOSIS — T451X5A Adverse effect of antineoplastic and immunosuppressive drugs, initial encounter: Secondary | ICD-10-CM

## 2015-09-24 LAB — CBC WITH DIFFERENTIAL/PLATELET
BASO%: 0.2 % (ref 0.0–2.0)
BASOS ABS: 0 10*3/uL (ref 0.0–0.1)
EOS%: 1 % (ref 0.0–7.0)
Eosinophils Absolute: 0.1 10*3/uL (ref 0.0–0.5)
HEMATOCRIT: 26.6 % — AB (ref 34.8–46.6)
HGB: 8.8 g/dL — ABNORMAL LOW (ref 11.6–15.9)
LYMPH#: 1.4 10*3/uL (ref 0.9–3.3)
LYMPH%: 23 % (ref 14.0–49.7)
MCH: 29.8 pg (ref 25.1–34.0)
MCHC: 33.1 g/dL (ref 31.5–36.0)
MCV: 89.9 fL (ref 79.5–101.0)
MONO#: 0.1 10*3/uL (ref 0.1–0.9)
MONO%: 1.3 % (ref 0.0–14.0)
NEUT#: 4.4 10*3/uL (ref 1.5–6.5)
NEUT%: 74.5 % (ref 38.4–76.8)
PLATELETS: 242 10*3/uL (ref 145–400)
RBC: 2.96 10*6/uL — AB (ref 3.70–5.45)
RDW: 18.6 % — ABNORMAL HIGH (ref 11.2–14.5)
WBC: 5.9 10*3/uL (ref 3.9–10.3)

## 2015-09-24 LAB — COMPREHENSIVE METABOLIC PANEL
ALT: 50 U/L (ref 0–55)
ANION GAP: 8 meq/L (ref 3–11)
AST: 37 U/L — ABNORMAL HIGH (ref 5–34)
Albumin: 2.9 g/dL — ABNORMAL LOW (ref 3.5–5.0)
Alkaline Phosphatase: 219 U/L — ABNORMAL HIGH (ref 40–150)
BUN: 26.5 mg/dL — ABNORMAL HIGH (ref 7.0–26.0)
CALCIUM: 9.3 mg/dL (ref 8.4–10.4)
CHLORIDE: 107 meq/L (ref 98–109)
CO2: 25 mEq/L (ref 22–29)
Creatinine: 0.7 mg/dL (ref 0.6–1.1)
EGFR: 78 mL/min/{1.73_m2} — ABNORMAL LOW (ref >90)
Glucose: 111 mg/dl (ref 70–140)
POTASSIUM: 4.6 meq/L (ref 3.5–5.1)
Sodium: 140 mEq/L (ref 136–145)
Total Bilirubin: 0.69 mg/dL (ref 0.20–1.20)
Total Protein: 6.3 g/dL — ABNORMAL LOW (ref 6.4–8.3)

## 2015-09-24 MED ORDER — SODIUM CHLORIDE 0.9% FLUSH
10.0000 mL | INTRAVENOUS | Status: DC | PRN
  Filled 2015-09-24: qty 10

## 2015-09-24 MED ORDER — SODIUM CHLORIDE 0.9% FLUSH
10.0000 mL | INTRAVENOUS | Status: DC | PRN
  Administered 2015-09-24 (×2): 10 mL via INTRAVENOUS
  Filled 2015-09-24: qty 10

## 2015-09-24 MED ORDER — SODIUM CHLORIDE 0.9 % IV SOLN
INTRAVENOUS | Status: AC
  Administered 2015-09-24: 14:00:00 via INTRAVENOUS

## 2015-09-24 MED ORDER — HEPARIN SOD (PORK) LOCK FLUSH 100 UNIT/ML IV SOLN
500.0000 [IU] | Freq: Once | INTRAVENOUS | Status: AC
  Administered 2015-09-24: 500 [IU] via INTRAVENOUS
  Filled 2015-09-24: qty 5

## 2015-09-24 NOTE — Telephone Encounter (Signed)
lmom to inform patient of r/s CT appt 5/19 at 130 pm

## 2015-09-24 NOTE — Patient Instructions (Signed)

## 2015-09-25 ENCOUNTER — Ambulatory Visit (HOSPITAL_COMMUNITY): Payer: Medicare Other

## 2015-09-25 ENCOUNTER — Encounter: Payer: Self-pay | Admitting: Nurse Practitioner

## 2015-09-25 DIAGNOSIS — T451X5A Adverse effect of antineoplastic and immunosuppressive drugs, initial encounter: Secondary | ICD-10-CM

## 2015-09-25 DIAGNOSIS — R5381 Other malaise: Secondary | ICD-10-CM | POA: Insufficient documentation

## 2015-09-25 DIAGNOSIS — D6481 Anemia due to antineoplastic chemotherapy: Secondary | ICD-10-CM | POA: Insufficient documentation

## 2015-09-25 NOTE — Assessment & Plan Note (Signed)
Patient received cycle 2, day 1 of her carboplatin/etoposide chemotherapy regimen on 09/17/2015.  She received Neulasta for growth factor support on 09/21/2015.  Patient's restaging scans will be changed from tomorrow to 10/04/2015.  Also, patient will return for labs and a flush on 10/01/2015; and return for labs, flush, visit, and chemotherapy on 10/08/2015.

## 2015-09-25 NOTE — Assessment & Plan Note (Signed)
Blood counts obtained today revealed a WBC of 5.9, ANC 4.4, hemoglobin has decreased from 11.0, down to 8.8, and platelet count is 242.  Advised patient that her increasing anemia could also be a factor in patient's complaint of fatigue.  However, patient denies any shortness of breath with exertion.  At this point.  We'll continue to monitor blood counts carefully.

## 2015-09-25 NOTE — Progress Notes (Signed)
SYMPTOM MANAGEMENT CLINIC    Chief Complaint: Fatigue, dehydration  HPI:  Meghan Castro 82 y.o. female diagnosed with lung cancer.  Currently undergoing carboplatin/etoposide chemotherapy regimen.  Patient presents to the [facility] today with complaint of fatigue, weakness, and dehydration.  She denies any recent fevers or chills.     Small cell carcinoma (HCC) (Resolved)    Initial Diagnosis Small cell carcinoma ([place])    Review of Systems  Constitutional: Positive for malaise/fatigue.  Neurological: Positive for weakness.  All other systems reviewed and are negative.   Past Medical History  Diagnosis Date  . Chronic airway obstruction, not elsewhere classified   . Unspecified sinusitis (chronic)   . Diaphragmatic hernia without mention of obstruction or gangrene   . Macular degeneration (senile) of retina, unspecified   . Other and unspecified hyperlipidemia   . Unspecified essential hypertension   . Unspecified disorder resulting from impaired renal function   . Mediastinal adenopathy   . Cancer [facility])     metastatic lung cancer  . TIA (transient ischemic attack)   . Small cell carcinoma of right lung ([place]) 07/31/2015    Past Surgical History  Procedure Laterality Date  . Arm fracture    . Cholecystectomy    . Total abdominal hysterectomy    . Bilateral salpingoophorectomy    . Incisional hernia repair      abdominal  . Appendectomy    . Cataract extraction    . Endobronchial ultrasound Bilateral 07/24/2015    Procedure: ENDOBRONCHIAL ULTRASOUND;  Surgeon: [name], MD;  Location: [facility];  Service: Cardiopulmonary;  Laterality: Bilateral;  . 2-d echocardiogram      has Hyperlipemia; MACULAR DEGENERATION, BILATERAL; Essential hypertension; Seasonal and perennial allergic rhinitis; SINUSITIS, CHRONIC; COPD with chronic bronchitis ([place]); HIATAL HERNIA; RENAL INSUFFICIENCY; Heart murmur, systolic; Interstitial lung disease ([place]); Mediastinal  lymphadenopathy; Small cell carcinoma of right lung ([place]); Oxygen dependent; Hypoalbuminemia due to protein-calorie malnutrition ([place]); Dehydration; Encounter for antineoplastic chemotherapy; Physical deconditioning; and Antineoplastic chemotherapy induced anemia on her problem list.    is allergic to other; penicillins; pravastatin sodium; quinolones; and moxifloxacin.    Medication List       This list is accurate as of: 09/24/15 11:59 PM.  Always use your most recent med list.               acetaminophen 500 MG tablet  Commonly known as:  TYLENOL  Take 500 mg by mouth every 6 (six) hours as needed for mild pain or moderate pain. Reported on 08/13/2015     ALPRAZolam 0.25 MG tablet  Commonly known as:  XANAX  Take 1 tablet (0.25 mg total) by mouth 3 (three) times daily as needed for anxiety.     aspirin 325 MG EC tablet  Take 325 mg by mouth daily.     Fish Oil 1000 MG Caps  Take 1 capsule by mouth daily.     furosemide 40 MG tablet  Commonly known as:  LASIX  Take 1 tablet (40 mg total) by mouth daily.     Ginger Root 500 MG Caps  Take 2 capsules by mouth daily. Reported on 08/13/2015     lidocaine-prilocaine cream  Commonly known as:  EMLA  Apply 1 application topically as needed.     loratadine 10 MG tablet  Commonly known as:  CLARITIN  Take 10 mg by mouth daily as needed (pain).     losartan 50 MG tablet  Commonly known as:  COZAAR  Take 50 mg by mouth daily.     MELATONIN PO  Take 10 mg by mouth daily.     multivitamin with minerals Tabs tablet  Take 1 tablet by mouth daily.     PROAIR HFA 108 (90 Base) MCG/ACT inhaler  Generic drug:  albuterol  INHALE 2 PUFFS INTO THE LUNGS EVERY 4 (FOUR) HOURS AS NEEDED FOR WHEEZING OR SHORTNESS OF BREATH.     prochlorperazine 10 MG tablet  Commonly known as:  COMPAZINE  Take 1 tablet (10 mg total) by mouth every 6 (six) hours as needed for nausea or vomiting.     saccharomyces boulardii 250 MG capsule  Commonly  known as:  FLORASTOR  Take 1 capsule (250 mg total) by mouth 2 (two) times daily.     Tiotropium Bromide Monohydrate 2.5 MCG/ACT Aers  Commonly known as:  SPIRIVA RESPIMAT  Inhale 2 puffs into the lungs daily.     traMADol 50 MG tablet  Commonly known as:  ULTRAM  Take 1 tablet (50 mg total) by mouth every 6 (six) hours as needed.         PHYSICAL EXAMINATION  Oncology Vitals 09/24/2015 09/24/2015  Height - -  Weight - 67.359 kg  Weight (lbs) - 148 lbs 8 oz  BMI (kg/m2) - -  Temp 97.8 98  Pulse 102 101  Resp 16 19  SpO2 99 99  BSA (m2) - -   BP Readings from Last 2 Encounters:  09/24/15 121/50  09/24/15 121/56    Physical Exam  Constitutional: She is oriented to person, place, and time. Vital signs are normal. She appears dehydrated. She appears unhealthy.  HENT:  Head: Normocephalic and atraumatic.  Mouth/Throat: Oropharynx is clear and moist.  Eyes: Conjunctivae and EOM are normal. Pupils are equal, round, and reactive to light. Right eye exhibits no discharge. Left eye exhibits no discharge. No scleral icterus.  Neck: Normal range of motion.  Pulmonary/Chest: Effort normal. No respiratory distress.  Musculoskeletal: Normal range of motion. She exhibits no edema or tenderness.  Neurological: She is alert and oriented to person, place, and time.  Skin: Skin is warm and dry. No rash noted. No erythema. There is pallor.  Psychiatric: Affect normal.  Nursing note and vitals reviewed.   LABORATORY DATA:. Appointment on 09/24/2015  Component Date Value Ref Range Status  . WBC 09/24/2015 5.9  3.9 - 10.3 10e3/uL Final  . NEUT# 09/24/2015 4.4  1.5 - 6.5 10e3/uL Final  . HGB 09/24/2015 8.8* 11.6 - 15.9 g/dL Final  . HCT 09/24/2015 26.6* 34.8 - 46.6 % Final  . Platelets 09/24/2015 242  145 - 400 10e3/uL Final  . MCV 09/24/2015 89.9  79.5 - 101.0 fL Final  . MCH 09/24/2015 29.8  25.1 - 34.0 pg Final  . MCHC 09/24/2015 33.1  31.5 - 36.0 g/dL Final  . RBC 09/24/2015 2.96*  3.70 - 5.45 10e6/uL Final  . RDW 09/24/2015 18.6* 11.2 - 14.5 % Final  . lymph# 09/24/2015 1.4  0.9 - 3.3 10e3/uL Final  . MONO# 09/24/2015 0.1  0.1 - 0.9 10e3/uL Final  . Eosinophils Absolute 09/24/2015 0.1  0.0 - 0.5 10e3/uL Final  . Basophils Absolute 09/24/2015 0.0  0.0 - 0.1 10e3/uL Final  . NEUT% 09/24/2015 74.5  38.4 - 76.8 % Final  . LYMPH% 09/24/2015 23.0  14.0 - 49.7 % Final  . MONO% 09/24/2015 1.3  0.0 - 14.0 % Final  . EOS% 09/24/2015 1.0  0.0 - 7.0 % Final  .  BASO% 09/24/2015 0.2  0.0 - 2.0 % Final  . Sodium 09/24/2015 140  136 - 145 mEq/L Final  . Potassium 09/24/2015 4.6  3.5 - 5.1 mEq/L Final  . Chloride 09/24/2015 107  98 - 109 mEq/L Final  . CO2 09/24/2015 25  22 - 29 mEq/L Final  . Glucose 09/24/2015 111  70 - 140 mg/dl Final   Glucose reference range is for nonfasting patients. Fasting glucose reference range is 70- 100.  Marland Kitchen BUN 09/24/2015 26.5* 7.0 - 26.0 mg/dL Final  . Creatinine 09/24/2015 0.7  0.6 - 1.1 mg/dL Final  . Total Bilirubin 09/24/2015 0.69  0.20 - 1.20 mg/dL Final  . Alkaline Phosphatase 09/24/2015 219* 40 - 150 U/L Final  . AST 09/24/2015 37* 5 - 34 U/L Final  . ALT 09/24/2015 50  0 - 55 U/L Final  . Total Protein 09/24/2015 6.3* 6.4 - 8.3 g/dL Final  . Albumin 09/24/2015 2.9* 3.5 - 5.0 g/dL Final  . Calcium 09/24/2015 9.3  8.4 - 10.4 mg/dL Final  . Anion Gap 09/24/2015 8  3 - 11 mEq/L Final  . EGFR 09/24/2015 78* >90 ml/min/1.73 m2 Final   eGFR is calculated using the CKD-EPI Creatinine Equation (2009)    RADIOGRAPHIC STUDIES: No results found.  ASSESSMENT/PLAN:    Small cell carcinoma of right lung ([place]) Patient received cycle 2, day 1 of her carboplatin/etoposide chemotherapy regimen on 09/17/2015.  She received Neulasta for growth factor support on 09/21/2015.  Patient's restaging scans will be changed from tomorrow to 10/04/2015.  Also, patient will return for labs and a flush on 10/01/2015; and return for labs, flush, visit, and  chemotherapy on 10/08/2015.  Physical deconditioning Patient states that she was admitted for approximately one week for treatment of C. difficile and dehydration; and was then transferred to a skilled nursing facility for approximate 3 weeks to recover.  She just returned back home with her family this past Saturday, 09/21/2015.  Patient is complaining of increased fatigue and generalized deconditioning.  She states that she is too tired to perform her daily activities.  Patient's family reports that physical therapy is scheduled to visit them in their home tomorrow for a full evaluation.  Encouraged patient to remain as active as possible; and to actively participate in physical therapy to regain some of her strength.  Dehydration Patient states that she's had minimal appetite and very poor oral intake recently.  She feels dehydrated today.  On exam.  Patient does appear fatigued; her lips appear parched and dry.  Patient will receive IV fluid rehydration while at the cancer Center today.  She was also encouraged to push fluids at home.  Advised both patient and her family that patient may very well need to return later this week to the [facility] for additional IV fluid rehydration.  Antineoplastic chemotherapy induced anemia Blood counts obtained today revealed a WBC of 5.9, ANC 4.4, hemoglobin has decreased from 11.0, down to 8.8, and platelet count is 242.  Advised patient that her increasing anemia could also be a factor in patient's complaint of fatigue.  However, patient denies any shortness of breath with exertion.  At this point.  We'll continue to monitor blood counts carefully.   Patient stated understanding of all instructions; and was in agreement with this plan of care. The patient knows to call the clinic with any problems, questions or concerns.   Total time spent with patient was 40 minutes;  with greater than 75 percent of that time  spent in face to face counseling  regarding patient's symptoms,  and coordination of care and follow up.  Disclaimer:This dictation was prepared with Dragon/digital dictation along with [employer]. Any transcriptional errors that result from this process are unintentional.  Drue Second, NP 09/25/2015

## 2015-09-25 NOTE — Assessment & Plan Note (Signed)
Patient states that she's had minimal appetite and very poor oral intake recently.  She feels dehydrated today.  On exam.  Patient does appear fatigued; her lips appear parched and dry.  Patient will receive IV fluid rehydration while at the cancer Center today.  She was also encouraged to push fluids at home.  Advised both patient and her family that patient may very well need to return later this week to the [facility] for additional IV fluid rehydration.

## 2015-09-25 NOTE — Assessment & Plan Note (Signed)
Patient states that she was admitted for approximately one week for treatment of C. difficile and dehydration; and was then transferred to a skilled nursing facility for approximate 3 weeks to recover.  She just returned back home with her family this past Saturday, 09/21/2015.  Patient is complaining of increased fatigue and generalized deconditioning.  She states that she is too tired to perform her daily activities.  Patient's family reports that physical therapy is scheduled to visit them in their home tomorrow for a full evaluation.  Encouraged patient to remain as active as possible; and to actively participate in physical therapy to regain some of her strength.

## 2015-09-26 ENCOUNTER — Inpatient Hospital Stay (HOSPITAL_COMMUNITY)
Admission: EM | Admit: 2015-09-26 | Discharge: 2015-10-01 | DRG: 377 | Disposition: A | Discharge status: Skilled Nursing Facility | Payer: Medicare Other | Attending: Internal Medicine | Admitting: Internal Medicine

## 2015-09-26 ENCOUNTER — Telehealth: Payer: Self-pay

## 2015-09-26 ENCOUNTER — Inpatient Hospital Stay (HOSPITAL_COMMUNITY): Payer: Medicare Other

## 2015-09-26 ENCOUNTER — Ambulatory Visit: Payer: Medicare Other

## 2015-09-26 ENCOUNTER — Ambulatory Visit (HOSPITAL_BASED_OUTPATIENT_CLINIC_OR_DEPARTMENT_OTHER): Payer: Medicare Other

## 2015-09-26 ENCOUNTER — Encounter (HOSPITAL_COMMUNITY): Payer: Self-pay | Admitting: *Deleted

## 2015-09-26 ENCOUNTER — Ambulatory Visit (HOSPITAL_BASED_OUTPATIENT_CLINIC_OR_DEPARTMENT_OTHER): Payer: Medicare Other | Admitting: Nurse Practitioner

## 2015-09-26 VITALS — BP 95/63 | HR 99 | Temp 97.7°F | Resp 16

## 2015-09-26 DIAGNOSIS — R5383 Other fatigue: Secondary | ICD-10-CM

## 2015-09-26 DIAGNOSIS — C3491 Malignant neoplasm of unspecified part of right bronchus or lung: Secondary | ICD-10-CM

## 2015-09-26 DIAGNOSIS — K2210 Ulcer of esophagus without bleeding: Secondary | ICD-10-CM | POA: Diagnosis present

## 2015-09-26 DIAGNOSIS — K269 Duodenal ulcer, unspecified as acute or chronic, without hemorrhage or perforation: Secondary | ICD-10-CM | POA: Diagnosis not present

## 2015-09-26 DIAGNOSIS — Z836 Family history of other diseases of the respiratory system: Secondary | ICD-10-CM | POA: Diagnosis not present

## 2015-09-26 DIAGNOSIS — Z9981 Dependence on supplemental oxygen: Secondary | ICD-10-CM

## 2015-09-26 DIAGNOSIS — H3530 Unspecified macular degeneration: Secondary | ICD-10-CM | POA: Diagnosis present

## 2015-09-26 DIAGNOSIS — Q399 Congenital malformation of esophagus, unspecified: Secondary | ICD-10-CM | POA: Diagnosis not present

## 2015-09-26 DIAGNOSIS — R197 Diarrhea, unspecified: Secondary | ICD-10-CM

## 2015-09-26 DIAGNOSIS — D61810 Antineoplastic chemotherapy induced pancytopenia: Secondary | ICD-10-CM

## 2015-09-26 DIAGNOSIS — I110 Hypertensive heart disease with heart failure: Secondary | ICD-10-CM | POA: Diagnosis present

## 2015-09-26 DIAGNOSIS — Z801 Family history of malignant neoplasm of trachea, bronchus and lung: Secondary | ICD-10-CM | POA: Diagnosis not present

## 2015-09-26 DIAGNOSIS — E860 Dehydration: Secondary | ICD-10-CM | POA: Diagnosis present

## 2015-09-26 DIAGNOSIS — E43 Unspecified severe protein-calorie malnutrition: Secondary | ICD-10-CM | POA: Insufficient documentation

## 2015-09-26 DIAGNOSIS — K253 Acute gastric ulcer without hemorrhage or perforation: Secondary | ICD-10-CM | POA: Diagnosis not present

## 2015-09-26 DIAGNOSIS — Z888 Allergy status to other drugs, medicaments and biological substances status: Secondary | ICD-10-CM | POA: Diagnosis not present

## 2015-09-26 DIAGNOSIS — Z79899 Other long term (current) drug therapy: Secondary | ICD-10-CM | POA: Diagnosis not present

## 2015-09-26 DIAGNOSIS — D6481 Anemia due to antineoplastic chemotherapy: Secondary | ICD-10-CM

## 2015-09-26 DIAGNOSIS — R531 Weakness: Secondary | ICD-10-CM

## 2015-09-26 DIAGNOSIS — J449 Chronic obstructive pulmonary disease, unspecified: Secondary | ICD-10-CM | POA: Diagnosis present

## 2015-09-26 DIAGNOSIS — D709 Neutropenia, unspecified: Secondary | ICD-10-CM

## 2015-09-26 DIAGNOSIS — D649 Anemia, unspecified: Secondary | ICD-10-CM | POA: Diagnosis not present

## 2015-09-26 DIAGNOSIS — E870 Hyperosmolality and hypernatremia: Secondary | ICD-10-CM | POA: Diagnosis present

## 2015-09-26 DIAGNOSIS — K625 Hemorrhage of anus and rectum: Secondary | ICD-10-CM

## 2015-09-26 DIAGNOSIS — T451X5A Adverse effect of antineoplastic and immunosuppressive drugs, initial encounter: Secondary | ICD-10-CM | POA: Diagnosis present

## 2015-09-26 DIAGNOSIS — K922 Gastrointestinal hemorrhage, unspecified: Secondary | ICD-10-CM

## 2015-09-26 DIAGNOSIS — C799 Secondary malignant neoplasm of unspecified site: Secondary | ICD-10-CM | POA: Diagnosis present

## 2015-09-26 DIAGNOSIS — I959 Hypotension, unspecified: Secondary | ICD-10-CM

## 2015-09-26 DIAGNOSIS — Z880 Allergy status to penicillin: Secondary | ICD-10-CM

## 2015-09-26 DIAGNOSIS — D701 Agranulocytosis secondary to cancer chemotherapy: Secondary | ICD-10-CM

## 2015-09-26 DIAGNOSIS — K259 Gastric ulcer, unspecified as acute or chronic, without hemorrhage or perforation: Secondary | ICD-10-CM | POA: Diagnosis present

## 2015-09-26 DIAGNOSIS — E785 Hyperlipidemia, unspecified: Secondary | ICD-10-CM | POA: Diagnosis present

## 2015-09-26 DIAGNOSIS — I5032 Chronic diastolic (congestive) heart failure: Secondary | ICD-10-CM | POA: Diagnosis not present

## 2015-09-26 DIAGNOSIS — Z7982 Long term (current) use of aspirin: Secondary | ICD-10-CM

## 2015-09-26 DIAGNOSIS — K319 Disease of stomach and duodenum, unspecified: Secondary | ICD-10-CM | POA: Diagnosis present

## 2015-09-26 DIAGNOSIS — K250 Acute gastric ulcer with hemorrhage: Secondary | ICD-10-CM | POA: Diagnosis present

## 2015-09-26 DIAGNOSIS — K921 Melena: Secondary | ICD-10-CM | POA: Diagnosis present

## 2015-09-26 DIAGNOSIS — J4489 Other specified chronic obstructive pulmonary disease: Secondary | ICD-10-CM | POA: Diagnosis present

## 2015-09-26 DIAGNOSIS — Z87891 Personal history of nicotine dependence: Secondary | ICD-10-CM | POA: Diagnosis not present

## 2015-09-26 DIAGNOSIS — D62 Acute posthemorrhagic anemia: Secondary | ICD-10-CM | POA: Insufficient documentation

## 2015-09-26 DIAGNOSIS — Z8673 Personal history of transient ischemic attack (TIA), and cerebral infarction without residual deficits: Secondary | ICD-10-CM

## 2015-09-26 DIAGNOSIS — Z91048 Other nonmedicinal substance allergy status: Secondary | ICD-10-CM

## 2015-09-26 HISTORY — DX: Other specified postprocedural states: Z98.890

## 2015-09-26 HISTORY — DX: Other specified postprocedural states: R11.2

## 2015-09-26 LAB — COMPREHENSIVE METABOLIC PANEL
ALK PHOS: 191 U/L — AB (ref 40–150)
ALT: 42 U/L (ref 0–55)
ANION GAP: 7 meq/L (ref 3–11)
AST: 23 U/L (ref 5–34)
Albumin: 2.7 g/dL — ABNORMAL LOW (ref 3.5–5.0)
BILIRUBIN TOTAL: 0.45 mg/dL (ref 0.20–1.20)
BUN: 26.8 mg/dL — AB (ref 7.0–26.0)
CALCIUM: 8.6 mg/dL (ref 8.4–10.4)
CO2: 23 mEq/L (ref 22–29)
Chloride: 110 mEq/L — ABNORMAL HIGH (ref 98–109)
Creatinine: 0.8 mg/dL (ref 0.6–1.1)
EGFR: 71 mL/min/{1.73_m2} — ABNORMAL LOW (ref >90)
GLUCOSE: 136 mg/dL (ref 70–140)
Potassium: 4.3 mEq/L (ref 3.5–5.1)
Sodium: 140 mEq/L (ref 136–145)
TOTAL PROTEIN: 6 g/dL — AB (ref 6.4–8.3)

## 2015-09-26 LAB — CBC WITH DIFFERENTIAL/PLATELET
BASO%: 0 % (ref 0.0–2.0)
BASOS ABS: 0 10*3/uL (ref 0.0–0.1)
Basophils Absolute: 0 10*3/uL (ref 0.0–0.1)
Basophils Relative: 0 %
EOS ABS: 0 10*3/uL (ref 0.0–0.7)
EOS%: 0.8 % (ref 0.0–7.0)
Eosinophils Absolute: 0 10*3/uL (ref 0.0–0.5)
Eosinophils Relative: 0 %
HCT: 20.1 % — ABNORMAL LOW (ref 36.0–46.0)
HEMATOCRIT: 18.5 % — AB (ref 34.8–46.6)
HGB: 6 g/dL — CL (ref 11.6–15.9)
Hemoglobin: 6.5 g/dL — CL (ref 12.0–15.0)
LYMPH#: 1 10*3/uL (ref 0.9–3.3)
LYMPH%: 78.2 % — ABNORMAL HIGH (ref 14.0–49.7)
LYMPHS ABS: 0.6 10*3/uL — AB (ref 0.7–4.0)
Lymphocytes Relative: 86 %
MCH: 29.7 pg (ref 25.1–34.0)
MCH: 29 pg (ref 26.0–34.0)
MCHC: 32.4 g/dL (ref 31.5–36.0)
MCHC: 32.3 g/dL (ref 30.0–36.0)
MCV: 91.6 fL (ref 79.5–101.0)
MCV: 89.7 fL (ref 78.0–100.0)
MONO ABS: 0.1 10*3/uL (ref 0.1–1.0)
MONO#: 0.2 10*3/uL (ref 0.1–0.9)
MONO%: 12.9 % (ref 0.0–14.0)
Monocytes Relative: 13 %
NEUT%: 8.1 % — ABNORMAL LOW (ref 38.4–76.8)
NEUTROS ABS: 0.1 10*3/uL — AB (ref 1.5–6.5)
NEUTROS ABS: 0 10*3/uL — AB (ref 1.7–7.7)
Neutrophils Relative %: 1 %
PLATELETS: 137 10*3/uL — AB (ref 145–400)
PLATELETS: 91 10*3/uL — AB (ref 150–400)
RBC: 2.02 10*6/uL — AB (ref 3.70–5.45)
RBC: 2.24 MIL/uL — ABNORMAL LOW (ref 3.87–5.11)
RDW: 18.7 % — AB (ref 11.2–14.5)
RDW: 18.6 % — AB (ref 11.5–15.5)
WBC: 1.2 10*3/uL — AB (ref 3.9–10.3)
WBC: 0.7 10*3/uL — CL (ref 4.0–10.5)

## 2015-09-26 LAB — BASIC METABOLIC PANEL
Anion gap: 6 (ref 5–15)
BUN: 27 mg/dL — AB (ref 6–20)
CHLORIDE: 111 mmol/L (ref 101–111)
CO2: 22 mmol/L (ref 22–32)
CREATININE: 0.64 mg/dL (ref 0.44–1.00)
Calcium: 8.2 mg/dL — ABNORMAL LOW (ref 8.9–10.3)
GFR calc Af Amer: >60 mL/min (ref >60)
GFR calc non Af Amer: >60 mL/min (ref >60)
Glucose, Bld: 115 mg/dL — ABNORMAL HIGH (ref 65–99)
Potassium: 4 mmol/L (ref 3.5–5.1)
SODIUM: 139 mmol/L (ref 135–145)

## 2015-09-26 LAB — PREPARE RBC (CROSSMATCH)

## 2015-09-26 MED ORDER — ADULT MULTIVITAMIN W/MINERALS CH
1.0000 | ORAL_TABLET | Freq: Every day | ORAL | Status: DC
  Administered 2015-09-27 – 2015-10-01 (×5): 1 via ORAL
  Filled 2015-09-26 (×5): qty 1

## 2015-09-26 MED ORDER — FUROSEMIDE 40 MG PO TABS
40.0000 mg | ORAL_TABLET | Freq: Every day | ORAL | Status: DC
  Administered 2015-09-27: 40 mg via ORAL
  Filled 2015-09-26: qty 1

## 2015-09-26 MED ORDER — PANTOPRAZOLE SODIUM 40 MG IV SOLR
40.0000 mg | Freq: Two times a day (BID) | INTRAVENOUS | Status: DC
Start: 2015-09-30 — End: 2015-09-29

## 2015-09-26 MED ORDER — TIOTROPIUM BROMIDE MONOHYDRATE 2.5 MCG/ACT IN AERS: 2.0000 | INHALATION_SPRAY | Freq: Every day | RESPIRATORY_TRACT | Status: DC

## 2015-09-26 MED ORDER — LIDOCAINE-PRILOCAINE 2.5-2.5 % EX CREA
1.0000 "application " | TOPICAL_CREAM | CUTANEOUS | Status: DC | PRN
  Filled 2015-09-26: qty 5

## 2015-09-26 MED ORDER — TIOTROPIUM BROMIDE MONOHYDRATE 18 MCG IN CAPS
18.0000 ug | ORAL_CAPSULE | Freq: Every day | RESPIRATORY_TRACT | Status: DC
  Administered 2015-09-28 – 2015-09-30 (×3): 18 ug via RESPIRATORY_TRACT
  Filled 2015-09-26 (×2): qty 5

## 2015-09-26 MED ORDER — LOSARTAN POTASSIUM 50 MG PO TABS
50.0000 mg | ORAL_TABLET | Freq: Every day | ORAL | Status: DC
  Filled 2015-09-26: qty 1

## 2015-09-26 MED ORDER — ACETAMINOPHEN 650 MG RE SUPP: 650.0000 mg | Freq: Four times a day (QID) | RECTAL | Status: DC | PRN

## 2015-09-26 MED ORDER — SODIUM CHLORIDE 0.9 % IV SOLN
INTRAVENOUS | Status: DC
  Administered 2015-09-26: 16:00:00 via INTRAVENOUS

## 2015-09-26 MED ORDER — ACETAMINOPHEN 325 MG PO TABS: 650.0000 mg | ORAL_TABLET | Freq: Four times a day (QID) | ORAL | Status: DC | PRN

## 2015-09-26 MED ORDER — ALBUTEROL SULFATE (2.5 MG/3ML) 0.083% IN NEBU
2.5000 mg | INHALATION_SOLUTION | RESPIRATORY_TRACT | Status: DC | PRN
Start: 2015-09-26 — End: 2015-10-01

## 2015-09-26 MED ORDER — PANTOPRAZOLE SODIUM 40 MG IV SOLR
80.0000 mg | Freq: Once | INTRAVENOUS | Status: AC
  Administered 2015-09-27: 80 mg via INTRAVENOUS
  Filled 2015-09-26: qty 80

## 2015-09-26 MED ORDER — SODIUM CHLORIDE 0.9 % IV SOLN
10.0000 mL/h | Freq: Once | INTRAVENOUS | Status: AC
  Administered 2015-09-26: 10 mL/h via INTRAVENOUS

## 2015-09-26 MED ORDER — TRAMADOL HCL 50 MG PO TABS: 50.0000 mg | ORAL_TABLET | Freq: Four times a day (QID) | ORAL | Status: DC | PRN

## 2015-09-26 MED ORDER — PROCHLORPERAZINE EDISYLATE 5 MG/ML IJ SOLN: 10.0000 mg | INTRAMUSCULAR | Status: DC | PRN

## 2015-09-26 MED ORDER — SODIUM CHLORIDE 0.9% FLUSH
3.0000 mL | Freq: Two times a day (BID) | INTRAVENOUS | Status: DC
  Administered 2015-09-27 – 2015-09-30 (×7): 3 mL via INTRAVENOUS

## 2015-09-26 MED ORDER — ONDANSETRON HCL 4 MG PO TABS: 4.0000 mg | ORAL_TABLET | Freq: Four times a day (QID) | ORAL | Status: DC | PRN

## 2015-09-26 MED ORDER — LORATADINE 10 MG PO TABS
10.0000 mg | ORAL_TABLET | Freq: Every day | ORAL | Status: DC | PRN
  Filled 2015-09-26: qty 1

## 2015-09-26 MED ORDER — TIOTROPIUM BROMIDE MONOHYDRATE 18 MCG IN CAPS
18.0000 ug | ORAL_CAPSULE | Freq: Every day | RESPIRATORY_TRACT | Status: DC
  Filled 2015-09-26: qty 5

## 2015-09-26 MED ORDER — OMEGA-3-ACID ETHYL ESTERS 1 G PO CAPS
1.0000 g | ORAL_CAPSULE | Freq: Every day | ORAL | Status: DC
  Administered 2015-09-27 – 2015-10-01 (×5): 1 g via ORAL
  Filled 2015-09-26 (×5): qty 1

## 2015-09-26 MED ORDER — ONDANSETRON HCL 4 MG/2ML IJ SOLN: 4.0000 mg | Freq: Four times a day (QID) | INTRAMUSCULAR | Status: DC | PRN

## 2015-09-26 MED ORDER — SODIUM CHLORIDE 0.9 % IV SOLN
8.0000 mg/h | INTRAVENOUS | Status: AC
  Administered 2015-09-27 – 2015-09-29 (×6): 8 mg/h via INTRAVENOUS
  Filled 2015-09-26 (×12): qty 80

## 2015-09-26 MED ORDER — ALPRAZOLAM 0.25 MG PO TABS: 0.2500 mg | ORAL_TABLET | Freq: Three times a day (TID) | ORAL | Status: DC | PRN

## 2015-09-26 NOTE — Progress Notes (Signed)
Per Selena Lesser, patient was transferred to the emergency department for evaluation related to a positive hemocult, Hbg of 6.0, ANC of 0.1 and potential C. Diff. Report was given to [place], Therapist, sports. Patient's son accompanied her for the transfer. Patient in stable condition at time of [place]-off report.  Ma Hillock, RN

## 2015-09-26 NOTE — ED Notes (Signed)
Bed: ZO10 Expected date:  Expected time:  Means of arrival:  Comments: Cancer center

## 2015-09-26 NOTE — Progress Notes (Signed)
Called to BR in hal. Pt sitting in wheelchair.Pt wheeled to bed and tried to do orthostatic , but pt could not stand for long to get BP . Port accessed and labs drawn and ivf started.

## 2015-09-26 NOTE — ED Notes (Signed)
Patient is alert and oriented x4.  She was seen today at the [facility] where after blood test it was  Found that she is having rectal bleeding.  Patient denies any pain.  Patient last chemo was last Thursday.   Patient has had blood test done today at the [facility] where her Hgb was reported at 6.2

## 2015-09-26 NOTE — ED Provider Notes (Addendum)
CSN: [id number]     Arrival date & time 09/26/15  1712 History   First MD Initiated Contact with Patient 09/26/15 1741     Chief Complaint  Patient presents with  . Rectal Bleeding     (Consider location/radiation/quality/duration/timing/severity/associated sxs/prior Treatment) HPI  Patient with metastatic non-small cell lung cancer presents with concern of weakness, near syncope. Patient has had chemotherapy one week ago. She notes over the past 2 days in particular she has had substantial diffuse weakness, has been unable to stand without near-syncope. No new fever and cough, confusion. Family members corroborate the history of present illness. Today, with persistent weakness, the patient saw her practitioner at the cancer center. There she was found to be weak, and after complaining of diarrhea, was found on Hemoccult test to have GI bleed. Patient denies history of prior lead. She takes aspirin for stroke prophylaxis.    Past Medical History  Diagnosis Date  . Chronic airway obstruction, not elsewhere classified   . Unspecified sinusitis (chronic)   . Diaphragmatic hernia without mention of obstruction or gangrene   . Macular degeneration (senile) of retina, unspecified   . Other and unspecified hyperlipidemia   . Unspecified essential hypertension   . Unspecified disorder resulting from impaired renal function   . Mediastinal adenopathy   . Cancer [facility])     metastatic lung cancer  . TIA (transient ischemic attack)   . Small cell carcinoma of right lung ([place]) 07/31/2015   Past Surgical History  Procedure Laterality Date  . Arm fracture    . Cholecystectomy    . Total abdominal hysterectomy    . Bilateral salpingoophorectomy    . Incisional hernia repair      abdominal  . Appendectomy    . Cataract extraction    . Endobronchial ultrasound Bilateral 07/24/2015    Procedure: ENDOBRONCHIAL ULTRASOUND;  Surgeon: [name], MD;  Location: [facility];  Service:  Cardiopulmonary;  Laterality: Bilateral;  . 2-d echocardiogram     Family History  Problem Relation Age of Onset  . Emphysema Father   . Heart disease Mother   . Lung cancer Father    Social History  Substance Use Topics  . Smoking status: Former Smoker    Quit date: 11/14/1989  . Smokeless tobacco: Not on file  . Alcohol Use: No   OB History    No data available     Review of Systems  Constitutional:       Per HPI, otherwise negative  HENT:       Per HPI, otherwise negative  Respiratory:       Per HPI, otherwise negative  Cardiovascular:       Per HPI, otherwise negative  Gastrointestinal: Negative for vomiting.  Endocrine:       Negative aside from HPI  Genitourinary:       Neg aside from HPI   Musculoskeletal:       Per HPI, otherwise negative  Skin: Positive for pallor.  Allergic/Immunologic: Positive for immunocompromised state.  Neurological: Positive for weakness. Negative for syncope.  Hematological: Bruises/bleeds easily.      Allergies  Other; Penicillins; Pravastatin sodium; Quinolones; and Moxifloxacin  Home Medications   Prior to Admission medications   Medication Sig Start Date End Date Taking? Authorizing Provider  ALPRAZolam (XANAX) 0.25 MG tablet Take 1 tablet (0.25 mg total) by mouth 3 (three) times daily as needed for anxiety. 09/20/15  Yes Gerlene Fee, NP  aspirin 325 MG EC tablet Take  325 mg by mouth daily.     Yes Historical Provider, MD  furosemide (LASIX) 40 MG tablet Take 1 tablet (40 mg total) by mouth daily. 08/30/15  Yes Verlee Monte, MD  Ginger, Zingiber officinalis, (GINGER ROOT) 500 MG CAPS Take 2 capsules by mouth daily. Reported on 08/13/2015   Yes Historical Provider, MD  lidocaine-prilocaine (EMLA) cream Apply 1 application topically as needed. Patient taking differently: Apply 1 application topically as needed (port).  08/06/15  Yes Curt Bears, MD  loratadine (CLARITIN) 10 MG tablet Take 10 mg by mouth daily as needed  (pain).    Yes Historical Provider, MD  losartan (COZAAR) 50 MG tablet Take 50 mg by mouth daily.   Yes Historical Provider, MD  MELATONIN PO Take 10 mg by mouth daily.   Yes Historical Provider, MD  Multiple Vitamin (MULTIVITAMIN WITH MINERALS) TABS tablet Take 1 tablet by mouth daily.   Yes Historical Provider, MD  Omega-3 Fatty Acids (FISH OIL) 1000 MG CAPS Take 1 capsule by mouth daily.   Yes Historical Provider, MD  PROAIR HFA 108 (90 BASE) MCG/ACT inhaler INHALE 2 PUFFS INTO THE LUNGS EVERY 4 (FOUR) HOURS AS NEEDED FOR WHEEZING OR SHORTNESS OF BREATH. 04/19/15  Yes Deneise Lever, MD  prochlorperazine (COMPAZINE) 10 MG tablet Take 1 tablet (10 mg total) by mouth every 6 (six) hours as needed for nausea or vomiting. 08/13/15  Yes Laurie Panda, NP  Tiotropium Bromide Monohydrate (SPIRIVA RESPIMAT) 2.5 MCG/ACT AERS Inhale 2 puffs into the lungs daily. 05/17/15  Yes Deneise Lever, MD  traMADol (ULTRAM) 50 MG tablet Take 1 tablet (50 mg total) by mouth every 6 (six) hours as needed. Patient taking differently: Take 50 mg by mouth every 6 (six) hours as needed (pain.).  09/20/15  Yes Gerlene Fee, NP  saccharomyces boulardii (FLORASTOR) 250 MG capsule Take 1 capsule (250 mg total) by mouth 2 (two) times daily. Patient not taking: Reported on 09/26/2015 08/30/15   Verlee Monte, MD   BP 99/55 mmHg  Pulse 102  Temp(Src) 98.4 F (36.9 C) (Oral)  Resp 18  SpO2 97% Physical Exam  Constitutional: She is oriented to person, place, and time.  Cachectic pale elderly-appearing female  HENT:  Head: Normocephalic and atraumatic.  Eyes: Conjunctivae and EOM are normal.  Cardiovascular: Normal rate and regular rhythm.   Pulmonary/Chest: Effort normal and breath sounds normal. No stridor. No respiratory distress.  Abdominal: She exhibits no distension.  Musculoskeletal: She exhibits no edema.  Neurological: She is alert and oriented to person, place, and time. No cranial nerve deficit.  Skin: Skin  is warm and dry. There is pallor.  Psychiatric: She is slowed.  Nursing note and vitals reviewed.   ED Course  Procedures (including critical care time) Labs Review Labs Reviewed  CBC WITH DIFFERENTIAL/PLATELET - Abnormal; Notable for the following:    WBC 0.7 (*)    RBC 2.24 (*)    Hemoglobin 6.5 (*)    HCT 20.1 (*)    RDW 18.6 (*)    Platelets 91 (*)    Neutro Abs 0.0 (*)    Lymphs Abs 0.6 (*)    All other components within normal limits  BASIC METABOLIC PANEL - Abnormal; Notable for the following:    Glucose, Bld 115 (*)    BUN 27 (*)    Calcium 8.2 (*)    All other components within normal limits  TYPE AND SCREEN  PREPARE RBC (CROSSMATCH)    I have  personally reviewed and evaluated these images and lab results as part of my medical decision-making.  Chart review notable for multiple oncologic visits, metastatic cancer, and evaluation stay with Hemoccult-positive blood, C. difficile test sent.  Initial labs notable for leukopenia, anemia. Absolute neutrophil count is 0.0. However, the patient is afebrile. Patient will receive blood transfusion, required admission.   On repeat exam the patient remains in similar condition. She and family members are aware of all results.   I discussed the patient's case w GI, who will evaluate the patient in the morning. MDM  Patient with ongoing chemotherapy for metastatic lung cancer presents with weakness, near syncope, and is found to have anemia, leukopenia. Patient does not have evidence for obvious infection, but with her neutropenia, she will require monitoring. Given the patient's anemia, some concern for GI cause, the patient was transfused 2 units of blood. Patient required admission for further evaluation and management.  CRITICAL CARE Performed by: Carmin Muskrat Total critical care time: 40 minutes Critical care time was exclusive of separately billable procedures and treating other patients. Critical care was  necessary to treat or prevent imminent or life-threatening deterioration. Critical care was time spent personally by me on the following activities: development of treatment plan with patient and/or surrogate as well as nursing, discussions with consultants, evaluation of patient's response to treatment, examination of patient, obtaining history from patient or surrogate, ordering and performing treatments and interventions, ordering and review of laboratory studies, ordering and review of radiographic studies, pulse oximetry and re-evaluation of patient's condition.   Carmin Muskrat, MD 09/26/15 1900  Carmin Muskrat, MD 09/26/15 2033

## 2015-09-26 NOTE — H&P (Addendum)
History and Physical    Meghan Castro NTI:[id number] DOB: 07-31-1928 DOA: 09/26/2015  Referring MD/NP/PA: Dr. Vanita Panda PCP: [name], MD  Outpatient Specialists: Dr. Inda Merlin, oncology Patient coming from: [facility]  Chief Complaint: Dark bowel movement  and fatigue  HPI: Meghan Castro is a 82 y.o. female with medical history significant of small cell metastatic lung cancer on chemotherapy, drug-induced neutropenia, COPD,HTN, HLD, macular degeneration, diaphragmatic hernia; who presents after being seen at [facility] today where she was found to have rectal bleeding and had a hemoglobin of 6.0. Patient sisters are at bedside and helps provide additional history. Currently, receiving chemotherapy of carboplatin/etoposide, and the last chemotherapy treatment was completed 1 week ago. Patient had just been seen yesterday at the Symptom management clinic with complaints of fatigue, weakness ,and dehydration for which she had been given IV fluids.  Today, when the patient was noted to have a dark blackish in color bowel movement while at home. Subsequently upon patient being seen at the [facility] she had another dark bowel movement and they obtained blood work and directed her to the emergency department. They deny any use of any blood thinners and felt that she is on aspirin for prior history of TIA. She complained of lightheadedness and near syncopal episode. Denies having any fever, chest pain, weight gain, headache, cough, congestion, rash, or significant diarrhea. Associated symptoms include a decreased appetite, 8 pound weight loss the last 2-3 weeks, and shortness of breath. She had previously been on Oxygen, but this was discontinued within the last 1-2 weeks. Patient had only been out of rehabilitation living at home for the last 6 days. Family notes that there've not been any sick contacts around her recently as they have assigned on the door of  their home.  ED Course: Upon admission to the emergency department patient was seen to be afebrile, tachycardia heart rates up to 112, respiratory rate up to 26, O2 saturations maintain room air, and blood pressure as low as 91/51. Initial laboratory revealed a WBC 0.1, ANC 0.1, hemoglobin 6.5,     Review of Systems: As per HPI otherwise 10 point review of systems negative.     Past Medical History  Diagnosis Date  . Chronic airway obstruction, not elsewhere classified   . Unspecified sinusitis (chronic)   . Diaphragmatic hernia without mention of obstruction or gangrene   . Macular degeneration (senile) of retina, unspecified   . Other and unspecified hyperlipidemia   . Unspecified essential hypertension   . Unspecified disorder resulting from impaired renal function   . Mediastinal adenopathy   . Cancer [facility])     metastatic lung cancer  . TIA (transient ischemic attack)   . 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 Arm fracture    . Cholecystectomy    . Total abdominal hysterectomy    . Bilateral salpingoophorectomy    . Incisional hernia repair      abdominal  . Appendectomy    . Cataract extraction    . Endobronchial ultrasound Bilateral 07/24/2015    Procedure: ENDOBRONCHIAL ULTRASOUND;  Surgeon: [name], MD;  Location: [facility];  Service: Cardiopulmonary;  Laterality: Bilateral;  . 2-d echocardiogram       reports that she quit smoking about 25 years ago. She does not have any smokeless tobacco history on file. She reports that she does not drink alcohol  or use illicit drugs.  Allergies  Allergen Reactions  . Other Shortness Of Breath    *Perfumes*  . Penicillins     Has patient had a PCN reaction causing immediate rash, facial/tongue/throat swelling, SOB or lightheadedness with hypotension: No Has patient had a PCN reaction causing severe rash involving mucus membranes or skin necrosis:  No Has patient had a PCN reaction that required hospitalization No Has patient had a PCN reaction occurring within the last 10 years: No If all of the above answers are "NO", then may proceed with Cephalosporin use.   . Pravastatin Sodium     REACTION: swelling in tongue and feet turn black  . Quinolones Other (See Comments)    hyper  . Moxifloxacin Anxiety    Family History  Problem Relation Age of Onset  . Emphysema Father   . Heart disease Mother   . Lung cancer Father     Prior to Admission medications   Medication Sig Start Date End Date Taking? Authorizing Provider  ALPRAZolam (XANAX) 0.25 MG tablet Take 1 tablet (0.25 mg total) by mouth 3 (three) times daily as needed for anxiety. 09/20/15  Yes Gerlene Fee, NP  aspirin 325 MG EC tablet Take 325 mg by mouth daily.     Yes Historical Provider, MD  furosemide (LASIX) 40 MG tablet Take 1 tablet (40 mg total) by mouth daily. 08/30/15  Yes Verlee Monte, MD  Ginger, Zingiber officinalis, (GINGER ROOT) 500 MG CAPS Take 2 capsules by mouth daily. Reported on 08/13/2015   Yes Historical Provider, MD  lidocaine-prilocaine (EMLA) cream Apply 1 application topically as needed. Patient taking differently: Apply 1 application topically as needed (port).  08/06/15  Yes Curt Bears, MD  loratadine (CLARITIN) 10 MG tablet Take 10 mg by mouth daily as needed (pain).    Yes Historical Provider, MD  losartan (COZAAR) 50 MG tablet Take 50 mg by mouth daily.   Yes Historical Provider, MD  MELATONIN PO Take 10 mg by mouth daily.   Yes Historical Provider, MD  Multiple Vitamin (MULTIVITAMIN WITH MINERALS) TABS tablet Take 1 tablet by mouth daily.   Yes Historical Provider, MD  Omega-3 Fatty Acids (FISH OIL) 1000 MG CAPS Take 1 capsule by mouth daily.   Yes Historical Provider, MD  PROAIR HFA 108 (90 BASE) MCG/ACT inhaler INHALE 2 PUFFS INTO THE LUNGS EVERY 4 (FOUR) HOURS AS NEEDED FOR WHEEZING OR SHORTNESS OF BREATH. 04/19/15  Yes Deneise Lever, MD   prochlorperazine (COMPAZINE) 10 MG tablet Take 1 tablet (10 mg total) by mouth every 6 (six) hours as needed for nausea or vomiting. 08/13/15  Yes Laurie Panda, NP  Tiotropium Bromide Monohydrate (SPIRIVA RESPIMAT) 2.5 MCG/ACT AERS Inhale 2 puffs into the lungs daily. 05/17/15  Yes Deneise Lever, MD  traMADol (ULTRAM) 50 MG tablet Take 1 tablet (50 mg total) by mouth every 6 (six) hours as needed. Patient taking differently: Take 50 mg by mouth every 6 (six) hours as needed (pain.).  09/20/15  Yes Gerlene Fee, NP  saccharomyces boulardii (FLORASTOR) 250 MG capsule Take 1 capsule (250 mg total) by mouth 2 (two) times daily. Patient not taking: Reported on 09/26/2015 08/30/15   Verlee Monte, MD    Physical Exam: Filed Vitals:   09/26/15 1721  BP: 99/55  Pulse: 102  Temp: 98.4 F (36.9 C)  TempSrc: Oral  Resp: 18  SpO2: 97%      Constitutional:Chronically ill-appearing elderly female Filed Vitals:   09/26/15 1721  BP: 99/55  Pulse: 102  Temp: 98.4 F (36.9 C)  TempSrc: Oral  Resp: 18  SpO2: 97%   Eyes: PERRL, lids and conjunctivae normal ENMT: Mucous membranes are moist. Posterior pharynx clear of any exudate or lesions.Normal dentition.  Neck: normal, supple, no masses, no thyromegaly Respiratory: clear to auscultation bilaterally, no wheezing, no crackles. Normal respiratory effort. No accessory muscle use.  Cardiovascular: Regular rate and rhythm, no murmurs / rubs / gallops. No extremity edema. 2+ pedal pulses. No carotid bruits.  Abdomen: no tenderness, no masses palpated. No hepatosplenomegaly. Bowel sounds positive.  Musculoskeletal: no clubbing / cyanosis. No joint deformity upper and lower extremities. Good ROM, no contractures. Normal muscle tone.  Skin: no rashes, lesions, ulcers. No induration, Pallor Neurologic: CN 2-12 grossly intact. Sensation intact, DTR normal. Strength 5/5 in all 4.  Psychiatric: Normal judgment and insight. Alert and oriented x 3.  Normal mood.     Labs on Admission: I have personally reviewed following labs and imaging studies  CBC:  Recent Labs Lab 09/24/15 1151 09/26/15 1521 09/26/15 1800  WBC 5.9 1.2* 0.7*  NEUTROABS 4.4 0.1* 0.0*  HGB 8.8* 6.0* 6.5*  HCT 26.6* 18.5* 20.1*  MCV 89.9 91.6 89.7  PLT 242 137* 91*   Basic Metabolic Panel:  Recent Labs Lab 09/24/15 1151 09/26/15 1521 09/26/15 1800  NA 140 140 139  K 4.6 4.3 4.0  CL  --   --  111  CO2 '25 23 22  '$ GLUCOSE 111 136 115*  BUN 26.5* 26.8* 27*  CREATININE 0.7 0.8 0.64  CALCIUM 9.3 8.6 8.2*   GFR: Estimated Creatinine Clearance: 47.1 mL/min (by C-G formula based on Cr of 0.64). Liver Function Tests:  Recent Labs Lab 09/24/15 1151 09/26/15 1521  AST 37* 23  ALT 50 42  ALKPHOS 219* 191*  BILITOT 0.69 0.45  PROT 6.3* 6.0*  ALBUMIN 2.9* 2.7*   No results for input(s): LIPASE, AMYLASE in the last 168 hours. No results for input(s): AMMONIA in the last 168 hours. Coagulation Profile: No results for input(s): INR, PROTIME in the last 168 hours. Cardiac Enzymes: No results for input(s): CKTOTAL, CKMB, CKMBINDEX, TROPONINI in the last 168 hours. BNP (last 3 results) No results for input(s): PROBNP in the last 8760 hours. HbA1C: No results for input(s): HGBA1C in the last 72 hours. CBG: No results for input(s): GLUCAP in the last 168 hours. Lipid Profile: No results for input(s): CHOL, HDL, LDLCALC, TRIG, CHOLHDL, LDLDIRECT in the last 72 hours. Thyroid Function Tests: No results for input(s): TSH, T4TOTAL, FREET4, T3FREE, THYROIDAB in the last 72 hours. Anemia Panel: No results for input(s): VITAMINB12, FOLATE, FERRITIN, TIBC, IRON, RETICCTPCT in the last 72 hours. Urine analysis:    Component Value Date/Time   COLORURINE AMBER* 08/23/2015 2126   APPEARANCEUR CLEAR 08/23/2015 2126   LABSPEC 1.026 08/23/2015 2126   PHURINE 6.0 08/23/2015 2126   GLUCOSEU 100* 08/23/2015 2126   HGBUR NEGATIVE 08/23/2015 2126   BILIRUBINUR  NEGATIVE 08/23/2015 2126   KETONESUR NEGATIVE 08/23/2015 2126   PROTEINUR 100* 08/23/2015 2126   NITRITE NEGATIVE 08/23/2015 2126   LEUKOCYTESUR NEGATIVE 08/23/2015 2126   Sepsis Labs: No results found for this or any previous visit (from the past 240 hour(s)).   Radiological Exams on Admission: No results found.    Assessment/Plan GI bleed: Acute. Patient noted to have 2 dark bowel movements today. Hemoglobin noted to be 6.5 on admission. Type and cross and transfusing 2 units of blood. Patient's reports being on  aspirin for previous history of - Admit to stepdown - NPO except meds - Protonix drip per protocol for possible upper GI bleed - Typed and crossed  - Continue transfusing 2 units of PRBCs - H&H every 4 hours following blood transfusion - GI was consulted in the ED by Dr. Vanita Panda  Pancytopenia secondary to chemotherapy: No clears signs of infection at this time. Chest x-ray does not show acute infiltrate chronic fibrotic changes, superimposed CHF cannot be excluded. - Neutropenic precautions   - Follow-up blood cultures  - Checking urinalysis - Held aspirin - Patient may need transfusion of platelets; if platelet count drops less than 50  Chronic diastolic CHF: LVEF is 60- 65% and has grade 1 diastolic on 2-D echo done on 08/25/2015. - strict I&O's and daily weights  - Continue furosemide  - Lasix  COPD without acute exacerbation: Previously on 2 L of nasal cannula oxygen at home on -  continuous pulse oximetry with nasal cannula oxygen as needed to keep O2 sats greater than 92%  Small cell lung cancer on chemotherapy: patient sees Dr. Earlie Server of oncology and the outpatient setting. - need to contact oncology in a.m.   DVT prophylaxis: SCDs Code Status: full Family Communication: Discussed patient's care with patient's 2 sisters  Disposition Plan: Undetermined Consults called:  Gastroenterology Admission status: Inpatient stepdown  Norval Morton MD [facility] Pager [phone]  If 7PM-7AM, please contact night-coverage [email or web address] Password [facility]  09/26/2015, 7:29 PM

## 2015-09-26 NOTE — Telephone Encounter (Signed)
Sister called stating pt had fluids Tuesday for dehydration. She has been very dizzy for the last 2 days and very sleepy. The PT Clyde Canterbury is at the house. Juliann Pulse stated sitting BP was 116/64, she could not hear a standing BP b/c the pt wanted to sit right back down and c/o swimmy headedness. Pt had BM in 3 in 1 and thought she was finished, pt then had massive BM in diaper. It is very dark and strong odor. S/w cyndee bacon and instructed Dare to bring pt in right away to [facility]. Also to bring a stool sample for testing. PT is bringing up a wheelchair and they will bring pt in right away.

## 2015-09-26 NOTE — ED Notes (Signed)
X-Ray with pt.

## 2015-09-27 ENCOUNTER — Encounter (HOSPITAL_COMMUNITY)
Admission: EM | Disposition: A | Discharge status: Skilled Nursing Facility | Payer: Self-pay | Source: Home / Self Care | Attending: Internal Medicine

## 2015-09-27 ENCOUNTER — Encounter (HOSPITAL_COMMUNITY): Payer: Self-pay

## 2015-09-27 ENCOUNTER — Encounter: Payer: Self-pay | Admitting: Nurse Practitioner

## 2015-09-27 DIAGNOSIS — E43 Unspecified severe protein-calorie malnutrition: Secondary | ICD-10-CM | POA: Insufficient documentation

## 2015-09-27 DIAGNOSIS — D62 Acute posthemorrhagic anemia: Secondary | ICD-10-CM | POA: Insufficient documentation

## 2015-09-27 DIAGNOSIS — I5032 Chronic diastolic (congestive) heart failure: Secondary | ICD-10-CM | POA: Diagnosis present

## 2015-09-27 DIAGNOSIS — C3491 Malignant neoplasm of unspecified part of right bronchus or lung: Secondary | ICD-10-CM

## 2015-09-27 DIAGNOSIS — D61810 Antineoplastic chemotherapy induced pancytopenia: Secondary | ICD-10-CM

## 2015-09-27 DIAGNOSIS — K253 Acute gastric ulcer without hemorrhage or perforation: Secondary | ICD-10-CM

## 2015-09-27 DIAGNOSIS — K921 Melena: Secondary | ICD-10-CM | POA: Insufficient documentation

## 2015-09-27 DIAGNOSIS — T451X5A Adverse effect of antineoplastic and immunosuppressive drugs, initial encounter: Secondary | ICD-10-CM | POA: Diagnosis present

## 2015-09-27 DIAGNOSIS — K269 Duodenal ulcer, unspecified as acute or chronic, without hemorrhage or perforation: Secondary | ICD-10-CM | POA: Insufficient documentation

## 2015-09-27 DIAGNOSIS — K922 Gastrointestinal hemorrhage, unspecified: Secondary | ICD-10-CM

## 2015-09-27 HISTORY — PX: ESOPHAGOGASTRODUODENOSCOPY: SHX5428

## 2015-09-27 LAB — CBC
HCT: 23.8 % — ABNORMAL LOW (ref 36.0–46.0)
Hemoglobin: 8.3 g/dL — ABNORMAL LOW (ref 12.0–15.0)
MCH: 29.7 pg (ref 26.0–34.0)
MCHC: 34.9 g/dL (ref 30.0–36.0)
MCV: 85.3 fL (ref 78.0–100.0)
Platelets: 77 10*3/uL — ABNORMAL LOW (ref 150–400)
RBC: 2.79 MIL/uL — AB (ref 3.87–5.11)
RDW: 17.4 % — ABNORMAL HIGH (ref 11.5–15.5)
WBC: 1.3 10*3/uL — AB (ref 4.0–10.5)

## 2015-09-27 LAB — MRSA PCR SCREENING: MRSA by PCR: NEGATIVE

## 2015-09-27 LAB — HEMOGLOBIN AND HEMATOCRIT, BLOOD
HEMATOCRIT: 23.6 % — AB (ref 36.0–46.0)
HEMATOCRIT: 21.4 % — AB (ref 36.0–46.0)
HEMOGLOBIN: 8.2 g/dL — AB (ref 12.0–15.0)
Hemoglobin: 7.5 g/dL — ABNORMAL LOW (ref 12.0–15.0)

## 2015-09-27 SURGERY — EGD (ESOPHAGOGASTRODUODENOSCOPY)
Anesthesia: Moderate Sedation

## 2015-09-27 MED ORDER — MIDAZOLAM HCL 5 MG/ML IJ SOLN
INTRAMUSCULAR | Status: AC
  Filled 2015-09-27: qty 2

## 2015-09-27 MED ORDER — FENTANYL CITRATE (PF) 100 MCG/2ML IJ SOLN
INTRAMUSCULAR | Status: AC
  Filled 2015-09-27: qty 2

## 2015-09-27 MED ORDER — FENTANYL CITRATE (PF) 100 MCG/2ML IJ SOLN
INTRAMUSCULAR | Status: DC | PRN
  Administered 2015-09-27: 12.5 ug via INTRAVENOUS

## 2015-09-27 MED ORDER — SACCHAROMYCES BOULARDII 250 MG PO CAPS
250.0000 mg | ORAL_CAPSULE | Freq: Two times a day (BID) | ORAL | Status: DC
Start: 2015-09-27 — End: 2015-10-01
  Administered 2015-09-27 – 2015-10-01 (×9): 250 mg via ORAL
  Filled 2015-09-27 (×10): qty 1

## 2015-09-27 MED ORDER — MIDAZOLAM HCL 10 MG/2ML IJ SOLN
INTRAMUSCULAR | Status: DC | PRN
  Administered 2015-09-27 (×2): 1 mg via INTRAVENOUS

## 2015-09-27 MED ORDER — ALTEPLASE 2 MG IJ SOLR
2.0000 mg | Freq: Once | INTRAMUSCULAR | Status: AC
  Administered 2015-09-27: 2 mg
  Filled 2015-09-27 (×2): qty 2

## 2015-09-27 MED ORDER — FUROSEMIDE 10 MG/ML IJ SOLN
20.0000 mg | Freq: Once | INTRAMUSCULAR | Status: AC
  Administered 2015-09-27: 20 mg via INTRAVENOUS
  Filled 2015-09-27: qty 2

## 2015-09-27 MED ORDER — SODIUM CHLORIDE 0.9 % IV SOLN: INTRAVENOUS | Status: DC

## 2015-09-27 MED ORDER — TBO-FILGRASTIM 300 MCG/0.5ML ~~LOC~~ SOSY
300.0000 ug | PREFILLED_SYRINGE | Freq: Once | SUBCUTANEOUS | Status: AC
  Administered 2015-09-27: 300 ug via SUBCUTANEOUS
  Filled 2015-09-27: qty 0.5

## 2015-09-27 NOTE — Progress Notes (Signed)
SYMPTOM MANAGEMENT CLINIC    Chief Complaint: Fatigue, dehydration  HPI:  Meghan Castro 82 y.o. female diagnosed with lung cancer.  Currently undergoing carboplatin/etoposide chemotherapy regimen.  Patient presents to the [facility] today with complain of significantly increased fatigue and weakness.  She continues to deny any shortness of breath.  On exam today.  Patient does appear very fatigued and frail.  She also appears very pale.  Labs obtained today reveal that hemoglobin has dropped from 8.8 down to 6.0 within less than 48 hours.  When questioned-patient does admit to developing diarrhea; with the stool very dark.  Also of note-patient was diagnosed with positive C. difficile approximately one month ago and treated appropriately.  Stool checked while in the cancer Center infusion area was Hemoccult positive.  Patient continues to deny any specific abdominal discomfort.  Also denies any recent fever or chills.  Due to obvious anemia, most likely secondary to a GI bleed-patient was transported to the emergency department for further evaluation and management.  Brief history.  Report were called to the emergency department charge nurse; prior to that patient be in transported to the emergency department via wheelchair by the [facility] nurse.    Small cell carcinoma (HCC) (Resolved)    Initial Diagnosis Small cell carcinoma ([place])    Review of Systems  Constitutional: Positive for malaise/fatigue.  Gastrointestinal: Positive for diarrhea and blood in stool. Negative for nausea, vomiting and abdominal pain.  Neurological: Positive for weakness.  All other systems reviewed and are negative.   Past Medical History  Diagnosis Date  . Chronic airway obstruction, not elsewhere classified   . Unspecified sinusitis (chronic)   . Diaphragmatic hernia without mention of obstruction or gangrene   . Macular degeneration (senile) of retina, unspecified   . Other and  unspecified hyperlipidemia   . Unspecified essential hypertension   . Unspecified disorder resulting from impaired renal function   . Mediastinal adenopathy   . Cancer [facility])     metastatic lung cancer  . TIA (transient ischemic attack)   . Small cell carcinoma of right lung ([place]) 07/31/2015  . PONV (postoperative nausea and vomiting)     Nausea/Vomiting     Past Surgical History  Procedure Laterality Date  . Arm fracture    . Cholecystectomy    . Total abdominal hysterectomy    . Bilateral salpingoophorectomy    . Incisional hernia repair      abdominal  . Appendectomy    . Cataract extraction    . Endobronchial ultrasound Bilateral 07/24/2015    Procedure: ENDOBRONCHIAL ULTRASOUND;  Surgeon: [name], MD;  Location: [facility];  Service: Cardiopulmonary;  Laterality: Bilateral;  . 2-d echocardiogram      has Hyperlipemia; MACULAR DEGENERATION, BILATERAL; Essential hypertension; Seasonal and perennial allergic rhinitis; SINUSITIS, CHRONIC; COPD with chronic bronchitis ([place]); HIATAL HERNIA; RENAL INSUFFICIENCY; Heart murmur, systolic; Interstitial lung disease ([place]); Mediastinal lymphadenopathy; Small cell carcinoma of right lung ([place]); Oxygen dependent; Hypoalbuminemia due to protein-calorie malnutrition ([place]); Dehydration; Encounter for antineoplastic chemotherapy; Physical deconditioning; Antineoplastic chemotherapy induced anemia; GI bleed; Antineoplastic chemotherapy induced pancytopenia ([place]); Diastolic dysfunction with chronic heart failure ([place]); Protein-calorie malnutrition, severe; Acute post-hemorrhagic anemia; Melena; Acute gastric ulcer; and Duodenal ulcer disease on her problem list.    is allergic to other; penicillins; pravastatin sodium; quinolones; and moxifloxacin.    Medication List       This list is accurate as of: 09/26/15  5:12 PM.  Always use your most recent med  list.               acetaminophen 500 MG tablet  Commonly known as:  TYLENOL    Take 500 mg by mouth every 6 (six) hours as needed for mild pain or moderate pain. Reported on 08/13/2015     ALPRAZolam 0.25 MG tablet  Commonly known as:  XANAX  Take 1 tablet (0.25 mg total) by mouth 3 (three) times daily as needed for anxiety.     aspirin 325 MG EC tablet  Take 325 mg by mouth daily.     Fish Oil 1000 MG Caps  Take 1 capsule by mouth daily.     furosemide 40 MG tablet  Commonly known as:  LASIX  Take 1 tablet (40 mg total) by mouth daily.     Ginger Root 500 MG Caps  Take 2 capsules by mouth daily. Reported on 08/13/2015     lidocaine-prilocaine cream  Commonly known as:  EMLA  Apply 1 application topically as needed.     loratadine 10 MG tablet  Commonly known as:  CLARITIN  Take 10 mg by mouth daily as needed (pain).     losartan 50 MG tablet  Commonly known as:  COZAAR  Take 50 mg by mouth daily.     MELATONIN PO  Take 10 mg by mouth daily.     multivitamin with minerals Tabs tablet  Take 1 tablet by mouth daily.     PROAIR HFA 108 (90 Base) MCG/ACT inhaler  Generic drug:  albuterol  INHALE 2 PUFFS INTO THE LUNGS EVERY 4 (FOUR) HOURS AS NEEDED FOR WHEEZING OR SHORTNESS OF BREATH.     prochlorperazine 10 MG tablet  Commonly known as:  COMPAZINE  Take 1 tablet (10 mg total) by mouth every 6 (six) hours as needed for nausea or vomiting.     saccharomyces boulardii 250 MG capsule  Commonly known as:  FLORASTOR  Take 1 capsule (250 mg total) by mouth 2 (two) times daily.     Tiotropium Bromide Monohydrate 2.5 MCG/ACT Aers  Commonly known as:  SPIRIVA RESPIMAT  Inhale 2 puffs into the lungs daily.     traMADol 50 MG tablet  Commonly known as:  ULTRAM  Take 1 tablet (50 mg total) by mouth every 6 (six) hours as needed.         PHYSICAL EXAMINATION  Oncology Vitals 09/27/2015 09/27/2015  Height - -  Weight - -  Weight (lbs) - -  BMI (kg/m2) - -  Temp - -  Pulse 102 100  Resp 25 22  SpO2 98 96  BSA (m2) - -   BP Readings from Last  2 Encounters:  09/27/15 117/70  09/26/15 95/63    Physical Exam  Constitutional: She is oriented to person, place, and time. Vital signs are normal. She appears dehydrated. She appears unhealthy.  HENT:  Head: Normocephalic and atraumatic.  Mouth/Throat: Oropharynx is clear and moist.  Eyes: Conjunctivae and EOM are normal. Pupils are equal, round, and reactive to light. Right eye exhibits no discharge. Left eye exhibits no discharge. No scleral icterus.  Neck: Normal range of motion.  Pulmonary/Chest: Effort normal. No respiratory distress.  Genitourinary: Guaiac positive stool.  Musculoskeletal: Normal range of motion. She exhibits no edema or tenderness.  Neurological: She is alert and oriented to person, place, and time.  Skin: Skin is warm and dry. No rash noted. No erythema. There is pallor.  Psychiatric: Affect normal.  Nursing note and vitals reviewed.  LABORATORY DATA:. Admission on 09/26/2015  Component Date Value Ref Range Status  . WBC 09/26/2015 0.7* 4.0 - 10.5 K/uL Final   Comment: RESULT REPEATED AND VERIFIED CRITICAL RESULT CALLED TO, READ BACK BY AND VERIFIED WITH: R.WATERS AT 1837 ON 09/26/15 BY S.VANHOORNE   . RBC 09/26/2015 2.24* 3.87 - 5.11 MIL/uL Final  . Hemoglobin 09/26/2015 6.5* 12.0 - 15.0 g/dL Final   Comment: RESULT REPEATED AND VERIFIED CRITICAL RESULT CALLED TO, READ BACK BY AND VERIFIED WITH: R.WATERS AT 1837 ON 09/26/15 BY S.VANHOORNE   . HCT 09/26/2015 20.1* 36.0 - 46.0 % Final  . MCV 09/26/2015 89.7  78.0 - 100.0 fL Final  . MCH 09/26/2015 29.0  26.0 - 34.0 pg Final  . MCHC 09/26/2015 32.3  30.0 - 36.0 g/dL Final  . RDW 09/26/2015 18.6* 11.5 - 15.5 % Final  . Platelets 09/26/2015 91* 150 - 400 K/uL Final  . Neutrophils Relative % 09/26/2015 1   Final  . Lymphocytes Relative 09/26/2015 86   Final  . Monocytes Relative 09/26/2015 13   Final  . Eosinophils Relative 09/26/2015 0   Final  . Basophils Relative 09/26/2015 0   Final  . Neutro Abs  09/26/2015 0.0* 1.7 - 7.7 K/uL Final  . Lymphs Abs 09/26/2015 0.6* 0.7 - 4.0 K/uL Final  . Monocytes Absolute 09/26/2015 0.1  0.1 - 1.0 K/uL Final  . Eosinophils Absolute 09/26/2015 0.0  0.0 - 0.7 K/uL Final  . Basophils Absolute 09/26/2015 0.0  0.0 - 0.1 K/uL Final  . WBC Morphology 09/26/2015 WHITE COUNT CONFIRMED ON SMEAR   Final  . Smear Review 09/26/2015 PLATELET COUNT CONFIRMED BY SMEAR   Final  . Sodium 09/26/2015 139  135 - 145 mmol/L Final  . Potassium 09/26/2015 4.0  3.5 - 5.1 mmol/L Final  . Chloride 09/26/2015 111  101 - 111 mmol/L Final  . CO2 09/26/2015 22  22 - 32 mmol/L Final  . Glucose, Bld 09/26/2015 115* 65 - 99 mg/dL Final  . BUN 09/26/2015 27* 6 - 20 mg/dL Final  . Creatinine, Ser 09/26/2015 0.64  0.44 - 1.00 mg/dL Final  . Calcium 09/26/2015 8.2* 8.9 - 10.3 mg/dL Final  . GFR calc non Af Amer 09/26/2015 >60  >60 mL/min Final  . GFR calc Af Amer 09/26/2015 >60  >60 mL/min Final   Comment: (NOTE) The eGFR has been calculated using the CKD EPI equation. This calculation has not been validated in all clinical situations. eGFR's persistently <60 mL/min signify possible Chronic Kidney Disease.   . Anion gap 09/26/2015 6  5 - 15 Final  . ABO/RH(D) 09/26/2015 O POS   Final  . Antibody Screen 09/26/2015 NEG   Final  . Sample Expiration 09/26/2015 09/29/2015   Final  . Unit Number 09/26/2015 [id number]   Final  . Blood Component Type 09/26/2015 RED CELLS,LR   Final  . Unit division 09/26/2015 00   Final  . Status of Unit 09/26/2015 ISSUED   Final  . Transfusion Status 09/26/2015 OK TO TRANSFUSE   Final  . Crossmatch Result 09/26/2015 Compatible   Final  . Unit Number 09/26/2015 [id number]   Final  . Blood Component Type 09/26/2015 RED CELLS,LR   Final  . Unit division 09/26/2015 00   Final  . Status of Unit 09/26/2015 ISSUED,FINAL   Final  . Transfusion Status 09/26/2015 OK TO TRANSFUSE   Final  . Crossmatch Result 09/26/2015 Compatible   Final  . Order  Confirmation 09/26/2015 ORDER PROCESSED BY BLOOD BANK  Final  . WBC 09/27/2015 1.3* 4.0 - 10.5 K/uL Final   CRITICAL VALUE NOTED.  VALUE IS CONSISTENT WITH PREVIOUSLY REPORTED AND CALLED VALUE.  Marland Kitchen RBC 09/27/2015 2.79* 3.87 - 5.11 MIL/uL Final  . Hemoglobin 09/27/2015 8.3* 12.0 - 15.0 g/dL Final   Comment: REPEATED TO VERIFY DELTA CHECK NOTED POST TRANSFUSION SPECIMEN   . HCT 09/27/2015 23.8* 36.0 - 46.0 % Final  . MCV 09/27/2015 85.3  78.0 - 100.0 fL Final  . MCH 09/27/2015 29.7  26.0 - 34.0 pg Final  . MCHC 09/27/2015 34.9  30.0 - 36.0 g/dL Final  . RDW 09/27/2015 17.4* 11.5 - 15.5 % Final  . Platelets 09/27/2015 77* 150 - 400 K/uL Final   CONSISTENT WITH PREVIOUS RESULT  . MRSA by PCR 09/26/2015 NEGATIVE  NEGATIVE Final   Comment:        The GeneXpert MRSA Assay (FDA approved for NASAL specimens only), is one component of a comprehensive MRSA colonization surveillance program. It is not intended to diagnose MRSA infection nor to guide or monitor treatment for MRSA infections.   . Hemoglobin 09/27/2015 8.2* 12.0 - 15.0 g/dL Final  . HCT 09/27/2015 23.6* 36.0 - 46.0 % Final  Appointment on 09/26/2015  Component Date Value Ref Range Status  . WBC 09/26/2015 1.2* 3.9 - 10.3 10e3/uL Final  . NEUT# 09/26/2015 0.1* 1.5 - 6.5 10e3/uL Final  . HGB 09/26/2015 6.0* 11.6 - 15.9 g/dL Final  . HCT 09/26/2015 18.5* 34.8 - 46.6 % Final  . Platelets 09/26/2015 137* 145 - 400 10e3/uL Final  . MCV 09/26/2015 91.6  79.5 - 101.0 fL Final  . MCH 09/26/2015 29.7  25.1 - 34.0 pg Final  . MCHC 09/26/2015 32.4  31.5 - 36.0 g/dL Final  . RBC 09/26/2015 2.02* 3.70 - 5.45 10e6/uL Final  . RDW 09/26/2015 18.7* 11.2 - 14.5 % Final  . lymph# 09/26/2015 1.0  0.9 - 3.3 10e3/uL Final  . MONO# 09/26/2015 0.2  0.1 - 0.9 10e3/uL Final  . Eosinophils Absolute 09/26/2015 0.0  0.0 - 0.5 10e3/uL Final  . Basophils Absolute 09/26/2015 0.0  0.0 - 0.1 10e3/uL Final  . NEUT% 09/26/2015 8.1* 38.4 - 76.8 % Final    . LYMPH% 09/26/2015 78.2* 14.0 - 49.7 % Final  . MONO% 09/26/2015 12.9  0.0 - 14.0 % Final  . EOS% 09/26/2015 0.8  0.0 - 7.0 % Final  . BASO% 09/26/2015 0.0  0.0 - 2.0 % Final  . Sodium 09/26/2015 140  136 - 145 mEq/L Final  . Potassium 09/26/2015 4.3  3.5 - 5.1 mEq/L Final  . Chloride 09/26/2015 110* 98 - 109 mEq/L Final  . CO2 09/26/2015 23  22 - 29 mEq/L Final  . Glucose 09/26/2015 136  70 - 140 mg/dl Final   Glucose reference range is for nonfasting patients. Fasting glucose reference range is 70- 100.  Marland Kitchen BUN 09/26/2015 26.8* 7.0 - 26.0 mg/dL Final  . Creatinine 09/26/2015 0.8  0.6 - 1.1 mg/dL Final  . Total Bilirubin 09/26/2015 0.45  0.20 - 1.20 mg/dL Final  . Alkaline Phosphatase 09/26/2015 191* 40 - 150 U/L Final  . AST 09/26/2015 23  5 - 34 U/L Final  . ALT 09/26/2015 42  0 - 55 U/L Final  . Total Protein 09/26/2015 6.0* 6.4 - 8.3 g/dL Final  . Albumin 09/26/2015 2.7* 3.5 - 5.0 g/dL Final  . Calcium 09/26/2015 8.6  8.4 - 10.4 mg/dL Final  . Anion Gap 09/26/2015 7  3 - 11 mEq/L  Final  . EGFR 09/26/2015 71* >90 ml/min/1.73 m2 Final   eGFR is calculated using the CKD-EPI Creatinine Equation (2009)  Appointment on 09/24/2015  Component Date Value Ref Range Status  . WBC 09/24/2015 5.9  3.9 - 10.3 10e3/uL Final  . NEUT# 09/24/2015 4.4  1.5 - 6.5 10e3/uL Final  . HGB 09/24/2015 8.8* 11.6 - 15.9 g/dL Final  . HCT 09/24/2015 26.6* 34.8 - 46.6 % Final  . Platelets 09/24/2015 242  145 - 400 10e3/uL Final  . MCV 09/24/2015 89.9  79.5 - 101.0 fL Final  . MCH 09/24/2015 29.8  25.1 - 34.0 pg Final  . MCHC 09/24/2015 33.1  31.5 - 36.0 g/dL Final  . RBC 09/24/2015 2.96* 3.70 - 5.45 10e6/uL Final  . RDW 09/24/2015 18.6* 11.2 - 14.5 % Final  . lymph# 09/24/2015 1.4  0.9 - 3.3 10e3/uL Final  . MONO# 09/24/2015 0.1  0.1 - 0.9 10e3/uL Final  . Eosinophils Absolute 09/24/2015 0.1  0.0 - 0.5 10e3/uL Final  . Basophils Absolute 09/24/2015 0.0  0.0 - 0.1 10e3/uL Final  . NEUT% 09/24/2015 74.5   38.4 - 76.8 % Final  . LYMPH% 09/24/2015 23.0  14.0 - 49.7 % Final  . MONO% 09/24/2015 1.3  0.0 - 14.0 % Final  . EOS% 09/24/2015 1.0  0.0 - 7.0 % Final  . BASO% 09/24/2015 0.2  0.0 - 2.0 % Final  . Sodium 09/24/2015 140  136 - 145 mEq/L Final  . Potassium 09/24/2015 4.6  3.5 - 5.1 mEq/L Final  . Chloride 09/24/2015 107  98 - 109 mEq/L Final  . CO2 09/24/2015 25  22 - 29 mEq/L Final  . Glucose 09/24/2015 111  70 - 140 mg/dl Final   Glucose reference range is for nonfasting patients. Fasting glucose reference range is 70- 100.  Marland Kitchen BUN 09/24/2015 26.5* 7.0 - 26.0 mg/dL Final  . Creatinine 09/24/2015 0.7  0.6 - 1.1 mg/dL Final  . Total Bilirubin 09/24/2015 0.69  0.20 - 1.20 mg/dL Final  . Alkaline Phosphatase 09/24/2015 219* 40 - 150 U/L Final  . AST 09/24/2015 37* 5 - 34 U/L Final  . ALT 09/24/2015 50  0 - 55 U/L Final  . Total Protein 09/24/2015 6.3* 6.4 - 8.3 g/dL Final  . Albumin 09/24/2015 2.9* 3.5 - 5.0 g/dL Final  . Calcium 09/24/2015 9.3  8.4 - 10.4 mg/dL Final  . Anion Gap 09/24/2015 8  3 - 11 mEq/L Final  . EGFR 09/24/2015 78* >90 ml/min/1.73 m2 Final   eGFR is calculated using the CKD-EPI Creatinine Equation (2009)    RADIOGRAPHIC STUDIES: Dg Chest Port 1 View  09/26/2015  CLINICAL DATA:  History of lung carcinoma. Gastrointestinal bleeding EXAM: PORTABLE CHEST 1 VIEW COMPARISON:  August 29, 2015 FINDINGS: There is elevation of the left hemidiaphragm. There is scarring in the right mid lung and left base regions. There is diffuse reticulonodular interstitial prominence, likely representing underlying fibrotic type change. No appreciable airspace consolidation. Heart size is upper normal with pulmonary vascularity within normal limits. Port-A-Cath tip is in the superior vena cava. No adenopathy. No bone lesions. IMPRESSION: Areas of interstitial fibrosis and scarring. Elevation left hemidiaphragm. No airspace consolidation. Stable cardiac silhouette. Note that chronic congestive heart  failure superimposed cannot be excluded radiographically. Electronically Signed   By: Lowella Grip III M.D.   On: 09/26/2015 20:08    ASSESSMENT/PLAN:    Small cell carcinoma of right lung [facility]) Patient received cycle 2, day 1 of her carboplatin/etoposide chemotherapy regimen  on 09/17/2015.  She received Neulasta for growth factor support on 09/21/2015.  Patient's restaging scans are scheduled for 10/04/2015.  Also, patient will return for labs and a flush on 10/01/2015; and return for labs, flush, visit, and chemotherapy on 10/08/2015.    GI bleed Patient presents to the [facility] today with complain of significantly increased fatigue and weakness.  She continues to deny any shortness of breath.  On exam today.  Patient does appear very fatigued and frail.  She also appears very pale.  Labs obtained today reveal that hemoglobin has dropped from 8.8 down to 6.0 within less than 48 hours.  When questioned-patient does admit to developing diarrhea; with the stool very dark.  Also of note-patient was diagnosed with positive C. difficile approximately one month ago and treated appropriately.  Stool checked while in the cancer Center infusion area was Hemoccult positive.  Patient continues to deny any specific abdominal discomfort.  Also denies any recent fever or chills.  Due to obvious anemia, most likely secondary to a GI bleed-patient was transported to the emergency department for further evaluation and management.  Brief history.  Report were called to the emergency department charge nurse; prior to that patient be in transported to the emergency department via wheelchair by the [facility] nurse.  Note: Patient should be considered a full code; since there are no advanced directives in the chart.  Antineoplastic chemotherapy induced pancytopenia [facility])  Patient last received chemotherapy on 09/19/2015.  Patient is suffering with low blood counts today with WBC down to 1.2,  ANC 0.1, hemoglobin has decreased from 8.8 down to 6.0, and platelet count is 137.  Patient remains afebrile.  Patient was reminded that she is considered neutropenic now; and should follow neutropenic precautions.  Due to patient's obvious GI bleed-patient will be transported to the emergency department for further evaluation and management.  See further notes for details.    Patient stated understanding of all instructions; and was in agreement with this plan of care. The patient knows to call the clinic with any problems, questions or concerns.   Total time spent with patient was 40 minutes;  with greater than 75 percent of that time spent in face to face counseling regarding patient's symptoms,  and coordination of care and follow up.  Disclaimer:This dictation was prepared with Dragon/digital dictation along with [employer]. Any transcriptional errors that result from this process are unintentional.  Drue Second, NP 09/27/2015

## 2015-09-27 NOTE — Consult Note (Signed)
Referring Provider: No ref. provider found Primary Care Physician:  [name], MD Primary Gastroenterologist:  Althia Forts  Reason for Consultation:  Black stools and anemia  HPI: Meghan Castro is a 82 y.o. female with medical history significant of widely metastatic small cell  lung cancer on chemotherapy (recent diagnosed in 07/2015), drug-induced neutropenia, COPD on O2, HTN, HLD, macular degeneration, diaphragmatic hernia.  She presented after being seen at [facility] on 5/11 for fatigue and was found to have dark stool and had a hemoglobin of 6.0 grams. Patient sisters are at bedside and helps provide additional history. Currently, receiving chemotherapy of carboplatin/etoposide, and the last chemotherapy treatment was completed 1 week ago. Patient had just been seen at the Symptom management clinic with complaints of fatigue, weakness, and dehydration for which she had been given IV fluids on 5/10.  Then yesterday when the patient was noted to have a dark blackish in color bowel movement while at home. Subsequently upon patient being seen at the [facility] again she had another dark bowel movement and they obtained blood work and directed her to the emergency department. Hgb was 6.0 grams, down from 8.8 grams just 2 days prior and Hgb was 11.0 grams 10 days ago.  They deny any use of any blood thinners and says that she is on aspirin 325 mg for prior history of TIA. She complained of lightheadedness and near syncopal episode.  Associated symptoms include a decreased appetite, 8 pound weight loss the last 2-3 weeks, and shortness of breath. She had previously been on Oxygen, but this was discontinued within the last 1-2 weeks.  WBC count was 0.7 and ANC was 0 on admission, which has improved slightly this AM.  She was admitted and transfused with 2 units PRBC's.  She is on PPI gtt currently.  She denies any other GI complaints including abdominal pain.  Denies  NSAID use.   Past Medical History  Diagnosis Date  . Chronic airway obstruction, not elsewhere classified   . Unspecified sinusitis (chronic)   . Diaphragmatic hernia without mention of obstruction or gangrene   . Macular degeneration (senile) of retina, unspecified   . Other and unspecified hyperlipidemia   . Unspecified essential hypertension   . Unspecified disorder resulting from impaired renal function   . Mediastinal adenopathy   . Cancer [facility])     metastatic lung cancer  . TIA (transient ischemic attack)   . Small cell carcinoma of right lung ([place]) 07/31/2015    Past Surgical History  Procedure Laterality Date  . Arm fracture    . Cholecystectomy    . Total abdominal hysterectomy    . Bilateral salpingoophorectomy    . Incisional hernia repair      abdominal  . Appendectomy    . Cataract extraction    . Endobronchial ultrasound Bilateral 07/24/2015    Procedure: ENDOBRONCHIAL ULTRASOUND;  Surgeon: [name], MD;  Location: [facility] ENDOSCOPY;  Service: Cardiopulmonary;  Laterality: Bilateral;  . 2-d echocardiogram      Prior to Admission medications   Medication Sig Start Date End Date Taking? Authorizing Provider  ALPRAZolam (XANAX) 0.25 MG tablet Take 1 tablet (0.25 mg total) by mouth 3 (three) times daily as needed for anxiety. 09/20/15  Yes Gerlene Fee, NP  aspirin 325 MG EC tablet Take 325 mg by mouth daily.     Yes Historical Provider, MD  furosemide (LASIX) 40 MG tablet Take 1 tablet (40 mg total) by  mouth daily. 08/30/15  Yes Verlee Monte, MD  Ginger, Zingiber officinalis, (GINGER ROOT) 500 MG CAPS Take 2 capsules by mouth daily. Reported on 08/13/2015   Yes Historical Provider, MD  lidocaine-prilocaine (EMLA) cream Apply 1 application topically as needed. Patient taking differently: Apply 1 application topically as needed (port).  08/06/15  Yes Curt Bears, MD  loratadine (CLARITIN) 10 MG tablet Take 10 mg by mouth daily as needed (pain).    Yes Historical  Provider, MD  losartan (COZAAR) 50 MG tablet Take 50 mg by mouth daily.   Yes Historical Provider, MD  MELATONIN PO Take 10 mg by mouth daily.   Yes Historical Provider, MD  Multiple Vitamin (MULTIVITAMIN WITH MINERALS) TABS tablet Take 1 tablet by mouth daily.   Yes Historical Provider, MD  Omega-3 Fatty Acids (FISH OIL) 1000 MG CAPS Take 1 capsule by mouth daily.   Yes Historical Provider, MD  PROAIR HFA 108 (90 BASE) MCG/ACT inhaler INHALE 2 PUFFS INTO THE LUNGS EVERY 4 (FOUR) HOURS AS NEEDED FOR WHEEZING OR SHORTNESS OF BREATH. 04/19/15  Yes Deneise Lever, MD  prochlorperazine (COMPAZINE) 10 MG tablet Take 1 tablet (10 mg total) by mouth every 6 (six) hours as needed for nausea or vomiting. 08/13/15  Yes Laurie Panda, NP  Tiotropium Bromide Monohydrate (SPIRIVA RESPIMAT) 2.5 MCG/ACT AERS Inhale 2 puffs into the lungs daily. 05/17/15  Yes Deneise Lever, MD  traMADol (ULTRAM) 50 MG tablet Take 1 tablet (50 mg total) by mouth every 6 (six) hours as needed. Patient taking differently: Take 50 mg by mouth every 6 (six) hours as needed (pain.).  09/20/15  Yes Gerlene Fee, NP  saccharomyces boulardii (FLORASTOR) 250 MG capsule Take 1 capsule (250 mg total) by mouth 2 (two) times daily. Patient not taking: Reported on 09/26/2015 08/30/15   Verlee Monte, MD    Current Facility-Administered Medications  Medication Dose Route Frequency Provider Last Rate Last Dose  . acetaminophen (TYLENOL) tablet 650 mg  650 mg Oral Q6H PRN Norval Morton, MD       Or  . acetaminophen (TYLENOL) suppository 650 mg  650 mg Rectal Q6H PRN Norval Morton, MD      . albuterol (PROVENTIL) (2.5 MG/3ML) 0.083% nebulizer solution 2.5 mg  2.5 mg Nebulization Q4H PRN Norval Morton, MD      . ALPRAZolam Duanne Moron) tablet 0.25 mg  0.25 mg Oral TID PRN Norval Morton, MD      . furosemide (LASIX) tablet 40 mg  40 mg Oral Daily Rondell A Tamala Julian, MD      . lidocaine-prilocaine (EMLA) cream 1 application  1 application  Topical PRN Norval Morton, MD      . loratadine (CLARITIN) tablet 10 mg  10 mg Oral Daily PRN Norval Morton, MD      . multivitamin with minerals tablet 1 tablet  1 tablet Oral Daily Rondell A Tamala Julian, MD      . omega-3 acid ethyl esters (LOVAZA) capsule 1 g  1 g Oral Daily Rondell A Tamala Julian, MD      . ondansetron (ZOFRAN) tablet 4 mg  4 mg Oral Q6H PRN Norval Morton, MD       Or  . ondansetron (ZOFRAN) injection 4 mg  4 mg Intravenous Q6H PRN Rondell A Tamala Julian, MD      . pantoprazole (PROTONIX) 80 mg in sodium chloride 0.9 % 250 mL (0.32 mg/mL) infusion  8 mg/hr Intravenous Continuous Norval Morton, MD  25 mL/hr at 09/27/15 0218 8 mg/hr at 09/27/15 0218  . [START ON 09/30/2015] pantoprazole (PROTONIX) injection 40 mg  40 mg Intravenous Q12H Rondell A Tamala Julian, MD      . prochlorperazine (COMPAZINE) injection 10 mg  10 mg Intravenous Q4H PRN Norval Morton, MD      . saccharomyces boulardii (FLORASTOR) capsule 250 mg  250 mg Oral BID Belkys A Regalado, MD      . sodium chloride flush (NS) 0.9 % injection 3 mL  3 mL Intravenous Q12H Norval Morton, MD   Stopped at 09/26/15 2200  . tiotropium (SPIRIVA) inhalation capsule 18 mcg  18 mcg Inhalation Daily Rondell A Tamala Julian, MD      . traMADol Veatrice Bourbon) tablet 50 mg  50 mg Oral Q6H PRN Norval Morton, MD       Facility-Administered Medications Ordered in Other Encounters  Medication Dose Route Frequency Provider Last Rate Last Dose  . 0.9 %  sodium chloride infusion   Intravenous Continuous Susanne Borders, NP   Stopped at 09/26/15 1700    Allergies as of 09/26/2015 - Review Complete 09/26/2015  Allergen Reaction Noted  . Other Shortness Of Breath 07/20/2015  . Penicillins    . Pravastatin sodium    . Quinolones Other (See Comments) 03/15/2014  . Moxifloxacin Anxiety     Family History  Problem Relation Age of Onset  . Emphysema Father   . Heart disease Mother   . Lung cancer Father     Social History   Social History  . Marital Status:  Widowed    Spouse Name: N/A  . Number of Children: N/A  . Years of Education: N/A   Occupational History  . retired Therapist, nutritional on [place]  . Smoking status: Former Smoker    Quit date: 11/14/1989  . Smokeless tobacco: Not on file  . Alcohol Use: No  . Drug Use: No  . Sexual Activity: Not on file   Other Topics Concern  . Not on file   Social History Narrative    Review of Systems: Ten point ROS is O/W negative except as mentioned in HPI.  Physical Exam: Vital signs in last 24 hours: Temp:  [97.7 F (36.5 C)-98.6 F (37 C)] 98.5 F (36.9 C) (05/12 0200) Pulse Rate:  [74-112] 74 (05/12 0700) Resp:  [16-28] 21 (05/12 0700) BP: (91-131)/(48-68) 118/51 mmHg (05/12 0600) SpO2:  [93 %-98 %] 97 % (05/12 0700) Weight:  [150 lb 12.7 oz (68.4 kg)] 150 lb 12.7 oz (68.4 kg) (05/12 0219)   General:  Alert, Well-developed, well-nourished, pleasant and cooperative in NAD. Head:  Normocephalic and atraumatic. Eyes:  Sclera clear, no icterus.  Conjunctiva pink. Ears:  Normal auditory acuity. Mouth:  No deformity or lesions.   Lungs:  Clear throughout to auscultation.  No wheezes, crackles, or rhonchi.  Heart:  Regular rate and rhythm; no murmurs, clicks, rubs, or gallops. Abdomen:  Soft, non-distended.  BS present.  Non-tender.  Rectal:  Very dark, hemoccult positive stool on exam glove. Msk:  Symmetrical without gross deformities. Pulses:  Normal pulses noted. Extremities:  Without clubbing or edema. Neurologic:  Alert and oriented x 4;  grossly normal neurologically. Skin:  Intact without significant lesions or rashes. Psych:  Alert and cooperative. Normal mood and affect.  Intake/Output from previous day: 05/11 0701 - 05/12 0700 In: 1487.5 [I.V.:17.5; Blood:700; IV Piggyback:100] Out: 67 [Urine:75]  Lab Results:  Recent  Labs  09/26/15 1521 09/26/15 1800 09/27/15 0645  WBC 1.2* 0.7* 1.3*  HGB 6.0* 6.5* 8.3*  HCT  18.5* 20.1* 23.8*  PLT 137* 91* 77*   BMET  Recent Labs  09/24/15 1151 09/26/15 1521 09/26/15 1800  NA 140 140 139  K 4.6 4.3 4.0  CL  --   --  111  CO2 '25 23 22  '$ GLUCOSE 111 136 115*  BUN 26.5* 26.8* 27*  CREATININE 0.7 0.8 0.64  CALCIUM 9.3 8.6 8.2*   LFT  Recent Labs  09/26/15 1521  PROT 6.0*  ALBUMIN 2.7*  AST 23  ALT 42  ALKPHOS 191*  BILITOT 0.45   Studies/Results: Dg Chest Port 1 View  09/26/2015  CLINICAL DATA:  History of lung carcinoma. Gastrointestinal bleeding EXAM: PORTABLE CHEST 1 VIEW COMPARISON:  August 29, 2015 FINDINGS: There is elevation of the left hemidiaphragm. There is scarring in the right mid lung and left base regions. There is diffuse reticulonodular interstitial prominence, likely representing underlying fibrotic type change. No appreciable airspace consolidation. Heart size is upper normal with pulmonary vascularity within normal limits. Port-A-Cath tip is in the superior vena cava. No adenopathy. No bone lesions. IMPRESSION: Areas of interstitial fibrosis and scarring. Elevation left hemidiaphragm. No airspace consolidation. Stable cardiac silhouette. Note that chronic congestive heart failure superimposed cannot be excluded radiographically. Electronically Signed   By: Lowella Grip III M.D.   On: 09/26/2015 20:08   IMPRESSION:  -82 year old female recently diagnosed with widely metastatic small cell lung cancer who is on chemotherapy who presented with complaints of fatigue and dark stools, hemoccult positive.  Hgb drop of 3 grams in 2 days (8.8 grams to 6.0 grams).  BUN elevated.  Likely UGIB such as ulcer disease, etc.  Hgb improved to 8.3 grams after 2 units PRBC's. -Pancytopenia:  Secondary to chemo.  Improving.  PLAN: -EGD later today at 2 pm. -Continue PPI gtt for now. -Monitor Hgb and transfuse further prn.  ZEHR, JESSICA D.  09/27/2015, 8:57 AM  Pager number [phone]

## 2015-09-27 NOTE — Assessment & Plan Note (Addendum)
Patient presents to the [facility] today with complain of significantly increased fatigue and weakness.  She continues to deny any shortness of breath.  On exam today.  Patient does appear very fatigued and frail.  She also appears very pale.  Labs obtained today reveal that hemoglobin has dropped from 8.8 down to 6.0 within less than 48 hours.  When questioned-patient does admit to developing diarrhea; with the stool very dark.  Also of note-patient was diagnosed with positive C. difficile approximately one month ago and treated appropriately.  Stool checked while in the cancer Center infusion area was Hemoccult positive.  Patient continues to deny any specific abdominal discomfort.  Also denies any recent fever or chills.  Due to obvious anemia, most likely secondary to a GI bleed-patient was transported to the emergency department for further evaluation and management.  Brief history.  Report were called to the emergency department charge nurse; prior to that patient be in transported to the emergency department via wheelchair by the [facility] nurse.  Note: Patient should be considered a full code; since there are no advanced directives in the chart.

## 2015-09-27 NOTE — Assessment & Plan Note (Signed)
Patient received cycle 2, day 1 of her carboplatin/etoposide chemotherapy regimen on 09/17/2015.  She received Neulasta for growth factor support on 09/21/2015.  Patient's restaging scans are scheduled for 10/04/2015.  Also, patient will return for labs and a flush on 10/01/2015; and return for labs, flush, visit, and chemotherapy on 10/08/2015.

## 2015-09-27 NOTE — Assessment & Plan Note (Signed)
Patient last received chemotherapy on 09/19/2015.  Patient is suffering with low blood counts today with WBC down to 1.2, ANC 0.1, hemoglobin has decreased from 8.8 down to 6.0, and platelet count is 137.  Patient remains afebrile.  Patient was reminded that she is considered neutropenic now; and should follow neutropenic precautions.  Due to patient's obvious GI bleed-patient will be transported to the emergency department for further evaluation and management.  See further notes for details.

## 2015-09-27 NOTE — Op Note (Signed)
[facility] Patient Name: Meghan Castro Procedure Date: 09/27/2015 MRN: [id number] Attending MD: Jerene Bears , MD Date of Birth: 02-Apr-1929 CSN: [id number] Age: 82 Admit Type: Inpatient Procedure:                Upper GI endoscopy Indications:              Acute post hemorrhagic anemia, Melena Providers:                Lajuan Lines. Hilarie Fredrickson, MD, Tory Emerald, RN, Cletis Athens,                            Technician Referring MD:             [facility] Medicines:                Fentanyl 12.5 micrograms IV, Midazolam 2 mg IV,                            Cetacaine spray Complications:            No immediate complications. Estimated Blood Loss:     Estimated blood loss: none. Procedure:                Pre-Anesthesia Assessment:                           - Prior to the procedure, a History and Physical                            was performed, and patient medications and                            allergies were reviewed. The patient's tolerance of                            previous anesthesia was also reviewed. The risks                            and benefits of the procedure and the sedation                            options and risks were discussed with the patient.                            All questions were answered, and informed consent                            was obtained. Prior Anticoagulants: The patient has                            taken no previous anticoagulant or antiplatelet                            agents. ASA Grade Assessment: III - A patient with  severe systemic disease. After reviewing the risks                            and benefits, the patient was deemed in                            satisfactory condition to undergo the procedure.                           After obtaining informed consent, the endoscope was                            passed under direct vision. Throughout the                            procedure, the  patient's blood pressure, pulse, and                            oxygen saturations were monitored continuously. The                            was introduced through the mouth, and advanced to                            the second part of duodenum. The upper GI endoscopy                            was accomplished without difficulty. The patient                            tolerated the procedure well. Scope In: Scope Out: Findings:      The middle third of the esophagus and lower third of the esophagus were       moderately tortuous.      One cratered esophageal ulcer with no bleeding and no stigmata of recent       bleeding was found at the gastroesophageal junction. The lesion was 8 mm       in largest dimension.      One non-bleeding linear gastric ulcer with no stigmata of bleeding was       found in the gastric body. The lesion was 4 mm in largest dimension.      A few localized, small non-bleeding erosions were found in the gastric       body. There were no stigmata of recent bleeding.      Seven non-obstructing non-bleeding cratered duodenal ulcers with no       stigmata of bleeding were found in the duodenal bulb and in the second       portion of the duodenum. The largest lesion was 10 mm in largest       dimension. Impression:               - Tortuous esophagus.                           - Non-bleeding ulcer at GE junction.                           -  Non-bleeding gastric ulcer with no stigmata of                            bleeding.                           - Non-bleeding erosive gastropathy.                           - Multiple non-obstructing non-bleeding duodenal                            ulcers with no stigmata of bleeding.                           - No specimens collected. Moderate Sedation:      Moderate (conscious) sedation was administered by the endoscopy nurse       and supervised by the endoscopist. The following parameters were       monitored: oxygen  saturation, heart rate, blood pressure, and response       to care. Total physician intraservice time was 16 minutes. Recommendation:           - Return patient to hospital ward for ongoing care.                           - Clear liquid diet.                           - Continue present medications.                           - No aspirin, ibuprofen, naproxen, or other                            non-steroidal anti-inflammatory drugs.                           - Use a proton pump inhibitor IV BID.                           - Perform an H. pylori serology.                           - Check hemoglobin closely and repeat transfusion                            if necessary. Procedure Code(s):        --- Professional ---                           [phone], Esophagogastroduodenoscopy, flexible,                            transoral; diagnostic, including collection of                            specimen(s) by brushing or washing, when performed                            (  separate procedure) Diagnosis Code(s):        --- Professional ---                           Q39.9, Congenital malformation of esophagus,                            unspecified                           K22.10, Ulcer of esophagus without bleeding                           K25.9, Gastric ulcer, unspecified as acute or                            chronic, without hemorrhage or perforation                           K31.89, Other diseases of stomach and duodenum                           K26.9, Duodenal ulcer, unspecified as acute or                            chronic, without hemorrhage or perforation                           D62, Acute posthemorrhagic anemia                           K92.1, Melena (includes Hematochezia) CPT copyright 2016 [employer]. All rights reserved. The codes documented in this report are preliminary and upon coder review may  be revised to meet current compliance requirements. Jerene Bears,  MD 09/27/2015 3:02:16 PM This report has been signed electronically. Number of Addenda: 0

## 2015-09-27 NOTE — Progress Notes (Signed)
Initial Nutrition Assessment  DOCUMENTATION CODES:   Severe malnutrition in context of acute illness/injury  INTERVENTION:  - Will order Carnation Instant Breakfast BID in [employer] for when diet advanced to at least FLD - Diet advancement as medically feasible - RD will continue to monitor for needs  NUTRITION DIAGNOSIS:   Increased nutrient needs related to catabolic illness, cancer and cancer related treatments as evidenced by estimated needs.  GOAL:   Patient will meet greater than or equal to 90% of their needs  MONITOR:   Diet advancement, Weight trends, Labs, I & O's  REASON FOR ASSESSMENT:   Malnutrition Screening Tool  ASSESSMENT:   82 y.o. female with medical history significant of small cell metastatic lung cancer on chemotherapy, drug-induced neutropenia, COPD,HTN, HLD, macular degeneration, diaphragmatic hernia; who presents after being seen at [facility] today where she was found to have rectal bleeding and had a hemoglobin of 6.0. Patient sisters are at bedside and helps provide additional history. Currently, receiving chemotherapy of carboplatin/etoposide, and the last chemotherapy treatment was completed 1 week ago. Patient had just been seen yesterday at the Symptom management clinic with complaints of fatigue, weakness ,and dehydration for which she had been given IV fluids. Today, when the patient was noted to have a dark blackish in color bowel movement while at home. Subsequently upon patient being seen at the [facility] she had another dark bowel movement and they obtained blood work and directed her to the emergency department. They deny any use of any blood thinners and felt that she is on aspirin for prior history of TIA. She complained of lightheadedness and near syncopal episode. Denies having any fever, chest pain, weight gain, headache, cough, congestion, rash, or significant diarrhea. Associated symptoms include a decreased  appetite, 8 pound weight loss the last 2-3 weeks, and shortness of breath. She had previously been on Oxygen, but this was discontinued within the last 1-2 weeks. Patient had only been out of rehabilitation living at home for the last 6 days.  Pt seen for MST. BMI indicates overweight status. Pt has been NPO since admission. Spoke with sister at bedside as pt was sleeping throughout discussion; sister provides all PTA information and also states that GI was in earlier today and stated plan to advance to [facility] today.   Pt was dx with cancer on 07/20/15, per sister, and since that time has lost weight from her UBW of 163 lbs. Based on CBW, pt has lost 13 lbs (8% body weight) in the past 2 months which is not significant for time frame. Unable to complete physical assessment at this time with respect for pt comfort; will attempt at follow-up. Pt meets criteria for malnutrition based on weight loss and <50% intakes for >/= 5 days PTA. Sister states that pt ate well 09/25/15 and ate more that day "than in the past 1.5 weeks combined." She states that since starting chemo pt has had decreased appetite and has complained of lack of taste with intakes (bland taste). Pt does well with ice cream and Carnation Instant Breakfast and sister sometimes combines them to make a milkshake.   Since starting chemo, pt has not had abdominal discomfort or nausea but sister states she gives pt antinausea medication every night before bed as a preventative measure. She denies pt having constipation or diarrhea PTA but states pt had 2 episodes of diarrhea the day before admission with very small ball-like BMs for a few days prior to  that. She states pt does not have any chewing or swallowing problems. Notes indicate last chemo was 09/19/15.  Pt unable to meet needs at this time. Medications reviewed; 40 mg oral Lasix/day, daily multivitamin with minerals. Labs reviewed; BUN: 27 mg/dL, Ca: 8.2 mg/dL, Alk Phos elevated.    Diet Order:   Diet NPO time specified Except for: Sips with Meds  Skin:  Reviewed, no issues  Last BM:  5/12  Height:   Ht Readings from Last 1 Encounters:  09/20/15 _0  (1.575 m)    Weight:   Wt Readings from Last 1 Encounters:  09/27/15 150 lb 12.7 oz (68.4 kg)    Ideal Body Weight:  50 kg (kg)  BMI:  Body mass index is 27.57 kg/(m^2).  Estimated Nutritional Needs:   Kcal:  1850-2050 (27-30 kcal/kg)  Protein:  95-105 grams  Fluid:  1.8-2 L/day  EDUCATION NEEDS:   No education needs identified at this time     Jarome Matin, RD, LDN Inpatient Clinical Dietitian Pager # [phone] After hours/weekend pager # [phone]

## 2015-09-27 NOTE — Progress Notes (Addendum)
PROGRESS NOTE    Meghan Castro  NOB:[id number] DOB: 05-29-1928 DOA: 09/26/2015 PCP: [name], MD  Outpatient Specialists: Dr [name].     Brief Narrative: Meghan Castro is a 82 y.o. 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 Last chemo was 1 week ago. Patient has been having dark black stool, which was occult blood positive.    Assessment & Plan:   Active Problems:   GI bleed  GI bleed, Acute blood loss anemia; Anemia Multifactorial GI bleed, post chemo.  Received 2 units PRBC.  Hb at 8. Continue to follow trend.  Continue with Protonix Gtt.  GI, [place] consulted.   Pancytopenia; Secondary to recent chemo.  Dr [name] added to rounding  team WBC increased to 1.3 Afebrile, no significant diarrhea. Avoid antibiotics.  Discussed with Dr [name] will give one dose of garnix or until neutrophils count above 1  Chronic diastolic CHF: LVEF is 60- 65% and has grade 1 diastolic on 2-D echo done on 08/25/2015. strict I&O's and daily weights  Continue furosemide    COPD without acute exacerbation: Previously on 2 L of nasal cannula oxygen at home on Continue with spiriva.   Metastatic Small cell lung cancer on chemotherapy: patient sees Dr. Earlie Server of oncology and the outpatient setting. Dr [name] added to rounding team.  Needs Goals of care, defer to oncologist/    DVT prophylaxis: SCD , no anticoagulation in setting of GI bleed.  Code Status: full code.  Family Communication: care discussed with patient.  Disposition Plan: remain in the step down for treatment GI bleed.    Consultants:   GI  Mohamed added to rounding team.    Procedures:   none   Antimicrobials:   none  Subjective: No new complaints. Last BM yesterday, stool was not watery as when she had C diff.   Objective: Filed Vitals:   09/27/15 0400 09/27/15 0500 09/27/15 0600 09/27/15 0700  BP: 131/55  118/51   Pulse: 94 87 90 74  Temp:      TempSrc:      Resp: '25 22 21 21  '$ Weight:      SpO2: 97% 97% 98% 97%    Intake/Output Summary (Last 24 hours) at 09/27/15 0754 Last data filed at 09/27/15 0300  Gross per 24 hour  Intake 1487.5 ml  Output     75 ml  Net 1412.5 ml   Filed Weights   09/27/15 0219  Weight: 68.4 kg (150 lb 12.7 oz)    Examination:  General exam: Appears calm and comfortable  Respiratory system: Clear to auscultation. Respiratory effort normal. Cardiovascular system: S1 & S2 heard, RRR. No JVD, murmurs, rubs, gallops or clicks. No pedal edema. Gastrointestinal system: Abdomen is nondistended, soft and nontender. No organomegaly or masses felt. Normal bowel sounds heard. Central nervous system: Alert and oriented. No focal neurological deficits. Extremities: Symmetric 5 x 5 power. Skin: No rashes, lesions or ulcers     Data Reviewed: I have personally reviewed following labs and imaging studies  CBC:  Recent Labs Lab 09/24/15 1151 09/26/15 1521 09/26/15 1800 09/27/15 0645  WBC 5.9 1.2* 0.7* 1.3*  NEUTROABS 4.4 0.1* 0.0*  --   HGB 8.8* 6.0* 6.5* 8.3*  HCT 26.6* 18.5* 20.1* 23.8*  MCV 89.9 91.6  89.7 85.3  PLT 242 137* 91* 77*   Basic Metabolic Panel:  Recent Labs Lab 09/24/15 1151 09/26/15 1521 09/26/15 1800  NA 140 140 139  K 4.6 4.3 4.0  CL  --   --  111  CO2 '25 23 22  '$ GLUCOSE 111 136 115*  BUN 26.5* 26.8* 27*  CREATININE 0.7 0.8 0.64  CALCIUM 9.3 8.6 8.2*   GFR: Estimated Creatinine Clearance: 47.4 mL/min (by C-G formula based on Cr of 0.64). Liver Function Tests:  Recent Labs Lab 09/24/15 1151 09/26/15 1521  AST 37* 23  ALT 50 42  ALKPHOS 219* 191*  BILITOT 0.69 0.45  PROT 6.3* 6.0*  ALBUMIN 2.9* 2.7*   No  results for input(s): LIPASE, AMYLASE in the last 168 hours. No results for input(s): AMMONIA in the last 168 hours. Coagulation Profile: No results for input(s): INR, PROTIME in the last 168 hours. Cardiac Enzymes: No results for input(s): CKTOTAL, CKMB, CKMBINDEX, TROPONINI in the last 168 hours. BNP (last 3 results) No results for input(s): PROBNP in the last 8760 hours. HbA1C: No results for input(s): HGBA1C in the last 72 hours. CBG: No results for input(s): GLUCAP in the last 168 hours. Lipid Profile: No results for input(s): CHOL, HDL, LDLCALC, TRIG, CHOLHDL, LDLDIRECT in the last 72 hours. Thyroid Function Tests: No results for input(s): TSH, T4TOTAL, FREET4, T3FREE, THYROIDAB in the last 72 hours. Anemia Panel: No results for input(s): VITAMINB12, FOLATE, FERRITIN, TIBC, IRON, RETICCTPCT in the last 72 hours. Urine analysis:    Component Value Date/Time   COLORURINE AMBER* 08/23/2015 2126   APPEARANCEUR CLEAR 08/23/2015 2126   LABSPEC 1.026 08/23/2015 2126   PHURINE 6.0 08/23/2015 2126   GLUCOSEU 100* 08/23/2015 2126   HGBUR NEGATIVE 08/23/2015 2126   BILIRUBINUR NEGATIVE 08/23/2015 2126   KETONESUR NEGATIVE 08/23/2015 2126   PROTEINUR 100* 08/23/2015 2126   NITRITE NEGATIVE 08/23/2015 2126   LEUKOCYTESUR NEGATIVE 08/23/2015 2126   Sepsis Labs: No results for input(s): PROCALCITON, LATICACIDVEN in the last 168 hours.  Recent Results (from the past 240 hour(s))  MRSA PCR Screening     Status: None   Collection Time: 09/26/15 10:29 PM  Result Value Ref Range Status   MRSA by PCR NEGATIVE NEGATIVE Final    Comment:        The GeneXpert MRSA Assay (FDA approved for NASAL specimens only), is one component of a comprehensive MRSA colonization surveillance program. It is not intended to diagnose MRSA infection nor to guide or monitor treatment for MRSA infections.          Radiology Studies: Dg Chest Port 1 View  09/26/2015  CLINICAL DATA:  History of  lung carcinoma. Gastrointestinal bleeding EXAM: PORTABLE CHEST 1 VIEW COMPARISON:  August 29, 2015 FINDINGS: There is elevation of the left hemidiaphragm. There is scarring in the right mid lung and left base regions. There is diffuse reticulonodular interstitial prominence, likely representing underlying fibrotic type change. No appreciable airspace consolidation. Heart size is upper normal with pulmonary vascularity within normal limits. Port-A-Cath tip is in the superior vena cava. No adenopathy. No bone lesions. IMPRESSION: Areas of interstitial fibrosis and scarring. Elevation left hemidiaphragm. No airspace consolidation. Stable cardiac silhouette. Note that chronic congestive heart failure superimposed cannot be excluded radiographically. Electronically Signed   By: Lowella Grip III M.D.   On: 09/26/2015 20:08        Scheduled Meds: . furosemide  40 mg Oral Daily  . losartan  50 mg Oral Daily  .  multivitamin with minerals  1 tablet Oral Daily  . omega-3 acid ethyl esters  1 g Oral Daily  . [START ON 09/30/2015] pantoprazole (PROTONIX) IV  40 mg Intravenous Q12H  . sodium chloride flush  3 mL Intravenous Q12H  . tiotropium  18 mcg Inhalation Daily   Continuous Infusions: . pantoprozole (PROTONIX) infusion 8 mg/hr (09/27/15 0218)     LOS: 1 day    Time spent: 35 minutes.     Elmarie Shiley, MD [facility] Pager [phone]  If 7PM-7AM, please contact night-coverage [email or web address] Password [facility] 09/27/2015, 7:54 AM

## 2015-09-27 NOTE — Care Management Note (Signed)
Case Management Note  Patient Details  Name: ANNALYCIA DONE MRN: [id number] Date of Birth: [birth year]  Subjective/Objective:                 Gi bleeding    Action/Plan:Date:  Sep 27, 2015 Chart reviewed for concurrent status and case management needs. Will continue to follow patient for changes and needs: Expected discharge date: [id number] Velva Harman, BSN, [place], [place]  Expected Discharge Date:   (Unknown) [id number]              Expected Discharge Plan:  [place]  In-House Referral:  NA  Discharge planning Services  CM Consult  Post Acute Care Choice:  NA Choice offered to:  NA  DME Arranged:  N/A DME Agency:     HH Arranged:  NA HH Agency:  NA  Status of Service:  In process, will continue to follow  Medicare Important Message Given:    Date Medicare IM Given:    Medicare IM give by:    Date Additional Medicare IM Given:    Additional Medicare Important Message give by:     If discussed at [place] of Stay Meetings, dates discussed:    Additional Comments:  Leeroy Cha, RN 09/27/2015, 9:31 AM

## 2015-09-28 LAB — BASIC METABOLIC PANEL WITH GFR
Anion gap: 7 (ref 5–15)
BUN: 30 mg/dL — ABNORMAL HIGH (ref 6–20)
CO2: 26 mmol/L (ref 22–32)
Calcium: 8.4 mg/dL — ABNORMAL LOW (ref 8.9–10.3)
Chloride: 113 mmol/L — ABNORMAL HIGH (ref 101–111)
Creatinine, Ser: 0.63 mg/dL (ref 0.44–1.00)
GFR calc Af Amer: >60 mL/min
GFR calc non Af Amer: >60 mL/min
Glucose, Bld: 100 mg/dL — ABNORMAL HIGH (ref 65–99)
Potassium: 3.2 mmol/L — ABNORMAL LOW (ref 3.5–5.1)
Sodium: 146 mmol/L — ABNORMAL HIGH (ref 135–145)

## 2015-09-28 LAB — CBC WITH DIFFERENTIAL/PLATELET
Basophils Absolute: 0 K/uL (ref 0.0–0.1)
Basophils Relative: 1 %
Eosinophils Absolute: 0 K/uL (ref 0.0–0.7)
Eosinophils Relative: 0 %
HCT: 22.9 % — ABNORMAL LOW (ref 36.0–46.0)
Hemoglobin: 7.9 g/dL — ABNORMAL LOW (ref 12.0–15.0)
Lymphocytes Relative: 53 %
Lymphs Abs: 1 K/uL (ref 0.7–4.0)
MCH: 29.7 pg (ref 26.0–34.0)
MCHC: 34.5 g/dL (ref 30.0–36.0)
MCV: 86.1 fL (ref 78.0–100.0)
Monocytes Absolute: 0.3 K/uL (ref 0.1–1.0)
Monocytes Relative: 15 %
Neutro Abs: 0.6 K/uL — ABNORMAL LOW (ref 1.7–7.7)
Neutrophils Relative %: 31 %
Platelets: 55 K/uL — ABNORMAL LOW (ref 150–400)
RBC: 2.66 MIL/uL — ABNORMAL LOW (ref 3.87–5.11)
RDW: 17.9 % — ABNORMAL HIGH (ref 11.5–15.5)
WBC: 1.9 K/uL — ABNORMAL LOW (ref 4.0–10.5)

## 2015-09-28 LAB — HEMOGLOBIN AND HEMATOCRIT, BLOOD
HCT: 21.8 % — ABNORMAL LOW (ref 36.0–46.0)
HCT: 24.9 % — ABNORMAL LOW (ref 36.0–46.0)
Hemoglobin: 7.5 g/dL — ABNORMAL LOW (ref 12.0–15.0)
Hemoglobin: 8.5 g/dL — ABNORMAL LOW (ref 12.0–15.0)

## 2015-09-28 LAB — PREPARE RBC (CROSSMATCH)

## 2015-09-28 MED ORDER — SODIUM CHLORIDE 0.9 % IV SOLN
Freq: Once | INTRAVENOUS | Status: AC
  Administered 2015-09-28: 19:00:00 via INTRAVENOUS

## 2015-09-28 MED ORDER — ALTEPLASE 2 MG IJ SOLR
2.0000 mg | Freq: Once | INTRAMUSCULAR | Status: AC
  Administered 2015-09-28: 2 mg
  Filled 2015-09-28: qty 2

## 2015-09-28 MED ORDER — FUROSEMIDE 10 MG/ML IJ SOLN
20.0000 mg | Freq: Once | INTRAMUSCULAR | Status: AC
  Administered 2015-09-28: 20 mg via INTRAVENOUS
  Filled 2015-09-28: qty 2

## 2015-09-28 MED ORDER — TBO-FILGRASTIM 300 MCG/0.5ML ~~LOC~~ SOSY
300.0000 ug | PREFILLED_SYRINGE | Freq: Once | SUBCUTANEOUS | Status: AC
  Administered 2015-09-28: 300 ug via SUBCUTANEOUS
  Filled 2015-09-28: qty 0.5

## 2015-09-28 MED ORDER — POTASSIUM CHLORIDE CRYS ER 20 MEQ PO TBCR
40.0000 meq | EXTENDED_RELEASE_TABLET | Freq: Once | ORAL | Status: AC
  Administered 2015-09-28: 40 meq via ORAL
  Filled 2015-09-28: qty 2

## 2015-09-28 NOTE — Progress Notes (Signed)
PROGRESS NOTE    Meghan Castro  QQI:[id number] DOB: [birth year] DOA: 09/26/2015 PCP: [name], MD  Outpatient Specialists: Dr [name].     Brief Narrative: Meghan Castro is a 82 y.o. 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 Last chemo was 1 week ago. Patient has been having dark black stool, which was occult blood positive.    Assessment & Plan:   Principal Problem:   GI bleed Active Problems:   COPD with chronic bronchitis (HCC)   Small cell carcinoma of right lung (HCC)   Antineoplastic chemotherapy induced pancytopenia (HCC)   Diastolic dysfunction with chronic heart failure (HCC)   Protein-calorie malnutrition, severe   Acute post-hemorrhagic anemia   Melena   Acute gastric ulcer   Duodenal ulcer disease  GI bleed, Acute blood loss anemia; Anemia; multiple ulcers gastric, duodenal.  Multifactorial GI bleed, post chemo.  Received 2 units PRBC.  Hb at 7.9 Continue to follow trend.  Continue with Protonix Gtt.  GI, [place] consulted.  S/P endoscopy: non bleeding ulcer at GE junction, non bleeding gastric ulcer, non bleeding erosive gastropathy, multiple non bleeding duodenal ulcer.  Hb drop to 7.5, will transfuse one unit   Pancytopenia; Secondary to recent chemo.  Dr [name] added to rounding  team WBC increased to 1.3 Afebrile, no significant diarrhea. Avoid antibiotics.  Discussed with Dr [name] will give  garnix or until neutrophils count above 1 Repeat granix today   Chronic diastolic CHF: LVEF is 60- 65% and has grade 1 diastolic on 2-D echo done on 08/25/2015. strict I&O's and daily weights  Hold  furosemide due to mild hypernatremia   COPD without acute exacerbation: Previously  on 2 L of nasal cannula oxygen at home on Continue with spiriva.   Metastatic Small cell lung cancer on chemotherapy: patient sees Dr. Earlie Server of oncology and the outpatient setting. Dr [name] added to rounding team.  Needs Goals of care, defer to oncologist/    DVT prophylaxis: SCD , no anticoagulation in setting of GI bleed.  Code Status: full code.  Family Communication: care discussed with patient.  Disposition Plan: remain in the step down for treatment GI bleed.    Consultants:   GI  Mohamed added to rounding team.    Procedures:   none   Antimicrobials:  none  Subjective: Denies dyspnea, feels weak   Objective: Filed Vitals:   09/28/15 0400 09/28/15 0425 09/28/15 0500 09/28/15 0600  BP: 112/50   119/57  Pulse: 85  82 86  Temp: 97.3 F (36.3 C)     TempSrc: Oral     Resp: '20  22 19  '$ Height:      Weight:  69 kg (152 lb 1.9 oz)    SpO2: 94%  95% 96%    Intake/Output Summary (Last 24 hours) at 09/28/15 0735 Last data filed at 09/28/15 0600  Gross per 24 hour  Intake    725 ml  Output     50 ml  Net    675 ml   Filed Weights   09/27/15 0800 09/27/15 1350 09/28/15 0425  Weight: 68.4 kg (150 lb 12.7 oz) 68.04 kg (150 lb) 69 kg (152 lb 1.9 oz)    Examination:  General exam: Appears calm and comfortable  Respiratory system:  Clear to auscultation. Respiratory effort normal. Cardiovascular system: S1 & S2 heard, RRR. No JVD, murmurs, rubs, gallops or clicks. No pedal edema. Gastrointestinal system: Abdomen is nondistended, soft and nontender. No organomegaly or masses felt. Normal bowel sounds heard. Central nervous system: Alert and oriented. No focal neurological deficits. Extremities: Symmetric 5 x 5 power. Skin: No rashes, lesions or ulcers     Data Reviewed: I have personally reviewed following labs and imaging studies  CBC:  Recent Labs Lab 09/24/15 1151 09/26/15 1521 09/26/15 1800 09/27/15 0645 09/27/15 1507 09/27/15 2215  09/28/15 0524  WBC 5.9 1.2* 0.7* 1.3*  --   --  1.9*  NEUTROABS 4.4 0.1* 0.0*  --   --   --  0.6*  HGB 8.8* 6.0* 6.5* 8.3* 8.2* 7.5* 7.9*  HCT 26.6* 18.5* 20.1* 23.8* 23.6* 21.4* 22.9*  MCV 89.9 91.6 89.7 85.3  --   --  86.1  PLT 242 137* 91* 77*  --   --  55*   Basic Metabolic Panel:  Recent Labs Lab 09/24/15 1151 09/26/15 1521 09/26/15 1800 09/28/15 0524  NA 140 140 139 146*  K 4.6 4.3 4.0 3.2*  CL  --   --  111 113*  CO2 '25 23 22 26  '$ GLUCOSE 111 136 115* 100*  BUN 26.5* 26.8* 27* 30*  CREATININE 0.7 0.8 0.64 0.63  CALCIUM 9.3 8.6 8.2* 8.4*   GFR: Estimated Creatinine Clearance: 46.5 mL/min (by C-G formula based on Cr of 0.63). Liver Function Tests:  Recent Labs Lab 09/24/15 1151 09/26/15 1521  AST 37* 23  ALT 50 42  ALKPHOS 219* 191*  BILITOT 0.69 0.45  PROT 6.3* 6.0*  ALBUMIN 2.9* 2.7*   No results for input(s): LIPASE, AMYLASE in the last 168 hours. No results for input(s): AMMONIA in the last 168 hours. Coagulation Profile: No results for input(s): INR, PROTIME in the last 168 hours. Cardiac Enzymes: No results for input(s): CKTOTAL, CKMB, CKMBINDEX, TROPONINI in the last 168 hours. BNP (last 3 results) No results for input(s): PROBNP in the last 8760 hours. HbA1C: No results for input(s): HGBA1C in the last 72 hours. CBG: No results for input(s): GLUCAP in the last 168 hours. Lipid Profile: No results for input(s): CHOL, HDL, LDLCALC, TRIG, CHOLHDL, LDLDIRECT in the last 72 hours. Thyroid Function Tests: No results for input(s): TSH, T4TOTAL, FREET4, T3FREE, THYROIDAB in the last 72 hours. Anemia Panel: No results for input(s): VITAMINB12, FOLATE, FERRITIN, TIBC, IRON, RETICCTPCT in the last 72 hours. Urine analysis:    Component Value Date/Time   COLORURINE AMBER* 08/23/2015 2126   APPEARANCEUR CLEAR 08/23/2015 2126   LABSPEC 1.026 08/23/2015 2126   PHURINE 6.0 08/23/2015 2126   GLUCOSEU 100* 08/23/2015 2126   HGBUR NEGATIVE 08/23/2015 2126    BILIRUBINUR NEGATIVE 08/23/2015 2126   KETONESUR NEGATIVE 08/23/2015 2126   PROTEINUR 100* 08/23/2015 2126   NITRITE NEGATIVE 08/23/2015 2126   LEUKOCYTESUR NEGATIVE 08/23/2015 2126   Sepsis Labs: No results for input(s): PROCALCITON, LATICACIDVEN in the last 168 hours.  Recent Results (from the past 240 hour(s))  MRSA PCR Screening     Status: None   Collection Time: 09/26/15 10:29 PM  Result Value Ref Range Status   MRSA by PCR NEGATIVE NEGATIVE Final    Comment:        The GeneXpert MRSA Assay (FDA approved for NASAL specimens only), is one component of a comprehensive MRSA colonization surveillance program. It is not intended to diagnose MRSA infection nor to guide or  monitor treatment for MRSA infections.          Radiology Studies: Dg Chest Port 1 View  09/26/2015  CLINICAL DATA:  History of lung carcinoma. Gastrointestinal bleeding EXAM: PORTABLE CHEST 1 VIEW COMPARISON:  August 29, 2015 FINDINGS: There is elevation of the left hemidiaphragm. There is scarring in the right mid lung and left base regions. There is diffuse reticulonodular interstitial prominence, likely representing underlying fibrotic type change. No appreciable airspace consolidation. Heart size is upper normal with pulmonary vascularity within normal limits. Port-A-Cath tip is in the superior vena cava. No adenopathy. No bone lesions. IMPRESSION: Areas of interstitial fibrosis and scarring. Elevation left hemidiaphragm. No airspace consolidation. Stable cardiac silhouette. Note that chronic congestive heart failure superimposed cannot be excluded radiographically. Electronically Signed   By: Lowella Grip III M.D.   On: 09/26/2015 20:08        Scheduled Meds: . multivitamin with minerals  1 tablet Oral Daily  . omega-3 acid ethyl esters  1 g Oral Daily  . [START ON 09/30/2015] pantoprazole (PROTONIX) IV  40 mg Intravenous Q12H  . saccharomyces boulardii  250 mg Oral BID  . sodium chloride  flush  3 mL Intravenous Q12H  . tiotropium  18 mcg Inhalation Daily   Continuous Infusions: . pantoprozole (PROTONIX) infusion 8 mg/hr (09/28/15 0600)     LOS: 2 days    Time spent: 35 minutes.     Elmarie Shiley, MD [facility] Pager [phone]  If 7PM-7AM, please contact night-coverage [email or web address] Password [facility] 09/28/2015, 7:35 AM

## 2015-09-29 LAB — CBC WITH DIFFERENTIAL/PLATELET
BASOS PCT: 0 %
Basophils Absolute: 0 10*3/uL (ref 0.0–0.1)
EOS PCT: 0 %
Eosinophils Absolute: 0 10*3/uL (ref 0.0–0.7)
HCT: 24.6 % — ABNORMAL LOW (ref 36.0–46.0)
Hemoglobin: 8.5 g/dL — ABNORMAL LOW (ref 12.0–15.0)
LYMPHS ABS: 1.2 10*3/uL (ref 0.7–4.0)
Lymphocytes Relative: 39 %
MCH: 29.3 pg (ref 26.0–34.0)
MCHC: 34.6 g/dL (ref 30.0–36.0)
MCV: 84.8 fL (ref 78.0–100.0)
MONO ABS: 0.1 10*3/uL (ref 0.1–1.0)
Monocytes Relative: 3 %
NEUTROS ABS: 1.7 10*3/uL (ref 1.7–7.7)
Neutrophils Relative %: 58 %
PLATELETS: 32 10*3/uL — AB (ref 150–400)
RBC: 2.9 MIL/uL — ABNORMAL LOW (ref 3.87–5.11)
RDW: 18 % — AB (ref 11.5–15.5)
WBC: 3 10*3/uL — ABNORMAL LOW (ref 4.0–10.5)

## 2015-09-29 LAB — CBC
HCT: 23.3 % — ABNORMAL LOW (ref 36.0–46.0)
HEMATOCRIT: 25.4 % — AB (ref 36.0–46.0)
HEMOGLOBIN: 8.7 g/dL — AB (ref 12.0–15.0)
Hemoglobin: 8 g/dL — ABNORMAL LOW (ref 12.0–15.0)
MCH: 29.2 pg (ref 26.0–34.0)
MCH: 28.5 pg (ref 26.0–34.0)
MCHC: 34.3 g/dL (ref 30.0–36.0)
MCHC: 34.3 g/dL (ref 30.0–36.0)
MCV: 85.2 fL (ref 78.0–100.0)
MCV: 82.9 fL (ref 78.0–100.0)
PLATELETS: 67 10*3/uL — AB (ref 150–400)
Platelets: 66 10*3/uL — ABNORMAL LOW (ref 150–400)
RBC: 2.98 MIL/uL — ABNORMAL LOW (ref 3.87–5.11)
RBC: 2.81 MIL/uL — AB (ref 3.87–5.11)
RDW: 18.3 % — ABNORMAL HIGH (ref 11.5–15.5)
RDW: 17.8 % — ABNORMAL HIGH (ref 11.5–15.5)
WBC: 4.4 10*3/uL (ref 4.0–10.5)
WBC: 4.8 10*3/uL (ref 4.0–10.5)

## 2015-09-29 LAB — BASIC METABOLIC PANEL
Anion gap: 6 (ref 5–15)
BUN: 20 mg/dL (ref 6–20)
CHLORIDE: 110 mmol/L (ref 101–111)
CO2: 25 mmol/L (ref 22–32)
CREATININE: 0.71 mg/dL (ref 0.44–1.00)
Calcium: 8.2 mg/dL — ABNORMAL LOW (ref 8.9–10.3)
GFR calc Af Amer: >60 mL/min (ref >60)
GFR calc non Af Amer: >60 mL/min (ref >60)
Glucose, Bld: 89 mg/dL (ref 65–99)
Potassium: 3.5 mmol/L (ref 3.5–5.1)
SODIUM: 141 mmol/L (ref 135–145)

## 2015-09-29 MED ORDER — SODIUM CHLORIDE 0.9 % IV SOLN
8.0000 mg/h | INTRAVENOUS | Status: DC
  Administered 2015-09-30: 8 mg/h via INTRAVENOUS
  Filled 2015-09-29 (×2): qty 80

## 2015-09-29 MED ORDER — FUROSEMIDE 10 MG/ML IJ SOLN
20.0000 mg | Freq: Once | INTRAMUSCULAR | Status: AC
  Administered 2015-09-29: 20 mg via INTRAVENOUS
  Filled 2015-09-29: qty 2

## 2015-09-29 MED ORDER — LIP MEDEX EX OINT
TOPICAL_OINTMENT | CUTANEOUS | Status: AC
  Administered 2015-09-29: 1
  Filled 2015-09-29: qty 7

## 2015-09-29 MED ORDER — SODIUM CHLORIDE 0.9 % IV SOLN
Freq: Once | INTRAVENOUS | Status: AC
  Administered 2015-09-29: 12:00:00 via INTRAVENOUS

## 2015-09-29 NOTE — Progress Notes (Signed)
PROGRESS NOTE    Meghan Castro  MVH:[id number] DOB: [birth year] DOA: 09/26/2015 PCP: [name], MD  Outpatient Specialists: Dr [name].     Brief Narrative: Meghan Castro is a 82 y.o. 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 Last chemo was 1 week ago. Patient has been having dark black stool, which was occult blood positive.    Assessment & Plan:   Principal Problem:   GI bleed Active Problems:   COPD with chronic bronchitis (HCC)   Small cell carcinoma of right lung (HCC)   Antineoplastic chemotherapy induced pancytopenia (HCC)   Diastolic dysfunction with chronic heart failure (HCC)   Protein-calorie malnutrition, severe   Acute post-hemorrhagic anemia   Melena   Acute gastric ulcer   Duodenal ulcer disease  GI bleed, Acute blood loss anemia; Anemia; multiple ulcers gastric, duodenal.  Multifactorial GI bleed, post chemo.  Received 3 units PRBC so far Continue with Protonix Gtt.  GI, [place] consulted.  S/P endoscopy: non bleeding ulcer at GE junction, non bleeding gastric ulcer, non bleeding erosive gastropathy, multiple non bleeding duodenal ulcer.  Hb at 8.5. Had black stool this am. Repeat labs this afternoon. Will transfuse platelet.   Pancytopenia; Secondary to recent chemo.  Dr [name] added to rounding  team WBC increased to 3.3 ANC 1. 7 Afebrile, no significant diarrhea. Avoid antibiotics.  Discussed with Dr [name] will give  garnix or until neutrophils count above 1 Has received 2 doses of granix.  Transfuse 1 unit of platelets.   Chronic diastolic CHF: LVEF is 60- 65% and has grade 1 diastolic on 2-D echo done on 08/25/2015. strict I&O's and daily weights  Hold  furosemide due  to mild hypernatremia   COPD without acute exacerbation: Previously on 2 L of nasal cannula oxygen at home on Continue with spiriva.   Metastatic Small cell lung cancer on chemotherapy: patient sees Dr. Earlie Server of oncology and the outpatient setting. Dr [name] added to rounding team.  Needs Goals of care, defer to oncologist/    DVT prophylaxis: SCD , no anticoagulation in setting of GI bleed.  Code Status: full code.  Family Communication: care discussed with patient.  Disposition Plan: remain in the step down for treatment GI bleed.    Consultants:   GI  Mohamed added to rounding team.    Procedures:   none   Antimicrobials:  none  Subjective: Denies dyspnea. Had BM this am, black. Not eating a lot.    Objective: Filed Vitals:   09/29/15 0408 09/29/15 0500 09/29/15 0600 09/29/15 0700  BP:   133/63   Pulse:  93 95   Temp:    98.4 F (36.9 C)  TempSrc:    Oral  Resp:  22 23   Height:      Weight: 69.4 kg (153 lb)     SpO2:  95% 99%     Intake/Output Summary (Last 24 hours) at 09/29/15 0842 Last data filed at 09/29/15 0600  Gross per 24 hour  Intake 845.33 ml  Output      0 ml  Net 845.33 ml   Filed Weights   09/27/15 1350 09/28/15 0425 09/29/15 0408  Weight: 68.04 kg (150 lb) 69 kg (152 lb 1.9 oz) 69.4 kg (153 lb)  Examination:  General exam: Appears calm and comfortable  Respiratory system: Clear to auscultation. Respiratory effort normal. Cardiovascular system: S1 & S2 heard, RRR. No JVD, murmurs, rubs, gallops or clicks. No pedal edema. Gastrointestinal system: Abdomen is nondistended, soft and nontender. No organomegaly or masses felt. Normal bowel sounds heard. Central nervous system: Alert and oriented. No focal neurological deficits. Extremities: Symmetric 5 x 5 power. Skin: No rashes, lesions or ulcers     Data Reviewed: I have personally reviewed following labs and imaging studies  CBC:  Recent Labs Lab 09/24/15 1151  09/26/15 1521  09/26/15 1800 09/27/15 0645  09/27/15 2215 09/28/15 0524 09/28/15 1348 09/28/15 2230 09/29/15 0404  WBC 5.9 1.2*  --  0.7* 1.3*  --   --  1.9*  --   --  3.0*  NEUTROABS 4.4 0.1*  --  0.0*  --   --   --  0.6*  --   --  1.7  HGB 8.8* 6.0*  < > 6.5* 8.3*  < > 7.5* 7.9* 7.5* 8.5* 8.5*  HCT 26.6* 18.5*  < > 20.1* 23.8*  < > 21.4* 22.9* 21.8* 24.9* 24.6*  MCV 89.9 91.6  --  89.7 85.3  --   --  86.1  --   --  84.8  PLT 242 137*  --  91* 77*  --   --  55*  --   --  32*  < > = values in this interval not displayed. Basic Metabolic Panel:  Recent Labs Lab 09/24/15 1151 09/26/15 1521 09/26/15 1800 09/28/15 0524 09/29/15 0404  NA 140 140 139 146* 141  K 4.6 4.3 4.0 3.2* 3.5  CL  --   --  111 113* 110  CO2 '25 23 22 26 25  '$ GLUCOSE 111 136 115* 100* 89  BUN 26.5* 26.8* 27* 30* 20  CREATININE 0.7 0.8 0.64 0.63 0.71  CALCIUM 9.3 8.6 8.2* 8.4* 8.2*   GFR: Estimated Creatinine Clearance: 46.6 mL/min (by C-G formula based on Cr of 0.71). Liver Function Tests:  Recent Labs Lab 09/24/15 1151 09/26/15 1521  AST 37* 23  ALT 50 42  ALKPHOS 219* 191*  BILITOT 0.69 0.45  PROT 6.3* 6.0*  ALBUMIN 2.9* 2.7*   No results for input(s): LIPASE, AMYLASE in the last 168 hours. No results for input(s): AMMONIA in the last 168 hours. Coagulation Profile: No results for input(s): INR, PROTIME in the last 168 hours. Cardiac Enzymes: No results for input(s): CKTOTAL, CKMB, CKMBINDEX, TROPONINI in the last 168 hours. BNP (last 3 results) No results for input(s): PROBNP in the last 8760 hours. HbA1C: No results for input(s): HGBA1C in the last 72 hours. CBG: No results for input(s): GLUCAP in the last 168 hours. Lipid Profile: No results for input(s): CHOL, HDL, LDLCALC, TRIG, CHOLHDL, LDLDIRECT in the last 72 hours. Thyroid Function Tests: No results for input(s): TSH, T4TOTAL, FREET4, T3FREE, THYROIDAB in the last 72 hours. Anemia Panel: No results for input(s): VITAMINB12,  FOLATE, FERRITIN, TIBC, IRON, RETICCTPCT in the last 72 hours. Urine analysis:    Component Value Date/Time   COLORURINE AMBER* 08/23/2015 2126   APPEARANCEUR CLEAR 08/23/2015 2126   LABSPEC 1.026 08/23/2015 2126   PHURINE 6.0 08/23/2015 2126   GLUCOSEU 100* 08/23/2015 2126   HGBUR NEGATIVE 08/23/2015 2126   BILIRUBINUR NEGATIVE 08/23/2015 2126   KETONESUR NEGATIVE 08/23/2015 2126   PROTEINUR 100* 08/23/2015 2126   NITRITE NEGATIVE 08/23/2015 2126   LEUKOCYTESUR NEGATIVE 08/23/2015 2126   Sepsis Labs: No results  for input(s): PROCALCITON, LATICACIDVEN in the last 168 hours.  Recent Results (from the past 240 hour(s))  MRSA PCR Screening     Status: None   Collection Time: 09/26/15 10:29 PM  Result Value Ref Range Status   MRSA by PCR NEGATIVE NEGATIVE Final    Comment:        The GeneXpert MRSA Assay (FDA approved for NASAL specimens only), is one component of a comprehensive MRSA colonization surveillance program. It is not intended to diagnose MRSA infection nor to guide or monitor treatment for MRSA infections.          Radiology Studies: No results found.      Scheduled Meds: . sodium chloride   Intravenous Once  . furosemide  20 mg Intravenous Once  . multivitamin with minerals  1 tablet Oral Daily  . omega-3 acid ethyl esters  1 g Oral Daily  . [START ON 09/30/2015] pantoprazole (PROTONIX) IV  40 mg Intravenous Q12H  . saccharomyces boulardii  250 mg Oral BID  . sodium chloride flush  3 mL Intravenous Q12H  . tiotropium  18 mcg Inhalation Daily   Continuous Infusions: . pantoprozole (PROTONIX) infusion 8 mg/hr (09/29/15 0600)     LOS: 3 days    Time spent: 35 minutes.     Elmarie Shiley, MD [facility] Pager [phone]  If 7PM-7AM, please contact night-coverage [email or web address] Password [facility] 09/29/2015, 8:42 AM

## 2015-09-30 ENCOUNTER — Encounter (HOSPITAL_COMMUNITY): Payer: Self-pay | Admitting: Internal Medicine

## 2015-09-30 ENCOUNTER — Telehealth: Payer: Self-pay | Admitting: Internal Medicine

## 2015-09-30 LAB — TYPE AND SCREEN
ABO/RH(D): O POS
ANTIBODY SCREEN: NEGATIVE
UNIT DIVISION: 0
Unit division: 0
Unit division: 0

## 2015-09-30 LAB — CBC
HCT: 24.3 % — ABNORMAL LOW (ref 36.0–46.0)
Hemoglobin: 8.2 g/dL — ABNORMAL LOW (ref 12.0–15.0)
MCH: 28.9 pg (ref 26.0–34.0)
MCHC: 33.7 g/dL (ref 30.0–36.0)
MCV: 85.6 fL (ref 78.0–100.0)
PLATELETS: 60 10*3/uL — AB (ref 150–400)
RBC: 2.84 MIL/uL — AB (ref 3.87–5.11)
RDW: 18 % — AB (ref 11.5–15.5)
WBC: 5.5 10*3/uL (ref 4.0–10.5)

## 2015-09-30 LAB — PREPARE PLATELET PHERESIS: Unit division: 0

## 2015-09-30 LAB — BASIC METABOLIC PANEL
ANION GAP: 5 (ref 5–15)
BUN: 13 mg/dL (ref 6–20)
CHLORIDE: 105 mmol/L (ref 101–111)
CO2: 26 mmol/L (ref 22–32)
Calcium: 8 mg/dL — ABNORMAL LOW (ref 8.9–10.3)
Creatinine, Ser: 0.69 mg/dL (ref 0.44–1.00)
Glucose, Bld: 85 mg/dL (ref 65–99)
POTASSIUM: 3.1 mmol/L — AB (ref 3.5–5.1)
SODIUM: 136 mmol/L (ref 135–145)

## 2015-09-30 LAB — HEMOGLOBIN AND HEMATOCRIT, BLOOD
HEMATOCRIT: 27.7 % — AB (ref 36.0–46.0)
HEMOGLOBIN: 9.4 g/dL — AB (ref 12.0–15.0)

## 2015-09-30 LAB — H. PYLORI ANTIBODY, IGG

## 2015-09-30 MED ORDER — FUROSEMIDE 40 MG PO TABS
40.0000 mg | ORAL_TABLET | Freq: Every day | ORAL | Status: DC
  Administered 2015-09-30 – 2015-10-01 (×2): 40 mg via ORAL
  Filled 2015-09-30 (×2): qty 1

## 2015-09-30 MED ORDER — POTASSIUM CHLORIDE CRYS ER 20 MEQ PO TBCR
40.0000 meq | EXTENDED_RELEASE_TABLET | Freq: Once | ORAL | Status: AC
  Administered 2015-09-30: 40 meq via ORAL
  Filled 2015-09-30: qty 2

## 2015-09-30 MED ORDER — PANTOPRAZOLE SODIUM 40 MG IV SOLR
40.0000 mg | Freq: Two times a day (BID) | INTRAVENOUS | Status: DC
  Administered 2015-09-30 – 2015-10-01 (×3): 40 mg via INTRAVENOUS
  Filled 2015-09-30 (×4): qty 40

## 2015-09-30 NOTE — Progress Notes (Signed)
Patient transferred to room 1515 on 5E.  Oriented to nurse, staff, unit, call bell and phone.  Vital signs obtained.  Patient is alert and oriented x 4, no acute distress, denies needs at this time.

## 2015-09-30 NOTE — Evaluation (Signed)
Physical Therapy Evaluation Patient Details Name: Meghan Castro MRN: [id number] DOB: Sep 28, 1928 Today's Date: 09/30/2015   History of Present Illness  Meghan Castro is a 82 y.o. 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 Assisted to roll and get washed up. HR increased 120' ans noted dyspnea 3/4 with activity.  Will assist OOB next visit. Pt admitted with above diagnosis. Pt currently with functional limitations due to the deficits listed below (see PT Problem List). Pt will benefit from skilled PT to increase their independence and safety with mobility to allow discharge to the venue listed below.       Follow Up Recommendations SNF;Home health PT (patient does not want to go to snf.)Patient recently in hospital and [place] to snf.    Equipment Recommendations  None recommended by PT    Recommendations for Other Services       Precautions / Restrictions Precautions Precautions: Fall      Mobility  Bed Mobility Overal bed mobility: Needs Assistance Bed Mobility: Rolling Rolling: Supervision         General bed mobility comments: rolls well, did not sit up as HR 120's with just mobility to change the sheets and wash her up.  Transfers                    Ambulation/Gait                Stairs            Wheelchair Mobility    Modified Rankin (Stroke Patients Only)       Balance                                             Pertinent Vitals/Pain Pain Assessment: Faces Faces Pain Scale: Hurts little more Pain Location: periarea Pain Descriptors / Indicators: Burning Pain Intervention(s): Limited  activity within patient's tolerance;Monitored during session;Repositioned    Home Living Family/patient expects to be discharged to:: Private residence Living Arrangements: Other relatives Available Help at Discharge: Family;Available 24 hours/day Type of Home: House Home Access: Stairs to enter   [employer] of Steps: 2 Home Layout: One level Home Equipment: Cane - single point;Walker - 2 wheels      Prior Function Level of Independence: Needs assistance               Kutner Dominance        Extremity/Trunk Assessment   Upper Extremity Assessment: Generalized weakness           Lower Extremity Assessment: Generalized weakness      Cervical / Trunk Assessment: Kyphotic  Communication   Communication: No difficulties  Cognition Arousal/Alertness: Awake/alert Behavior During Therapy: WFL for tasks assessed/performed Overall Cognitive Status: Within Functional Limits for tasks assessed                      General Comments      Exercises        Assessment/Plan    PT Assessment Patient needs continued PT services  PT Diagnosis Difficulty walking;Generalized weakness  PT Problem List Decreased strength;Decreased activity tolerance;Decreased mobility;Decreased knowledge of use of DME;Decreased safety awareness;Decreased skin integrity;Pain;Decreased knowledge of precautions;Cardiopulmonary status limiting activity  PT Treatment Interventions DME instruction;Gait training;Stair training;Functional mobility training;Therapeutic activities;Therapeutic exercise;Patient/family education   PT Goals (Current goals can be found in the Care Plan section) Acute Rehab PT Goals Patient Stated Goal: to go home PT Goal Formulation: With patient Time For Goal Achievement: 10/14/15 Potential to Achieve Goals: Good    Frequency Min 3X/week   Barriers to discharge        Co-evaluation               End of Session   Activity Tolerance:  Patient limited by fatigue;Treatment limited secondary to medical complications (Comment) Patient left: in bed;with call bell/phone within reach;with bed alarm set Nurse Communication: Mobility status         Time: 5456-2563 PT Time Calculation (min) (ACUTE ONLY): 22 min   Charges:   PT Evaluation $PT Eval Low Complexity: 1 Procedure     PT G CodesClaretha Cooper 09/30/2015, 5:35 PM [name] PT [phone]

## 2015-09-30 NOTE — Progress Notes (Signed)
PROGRESS NOTE    Meghan Castro  KZL:[id number] DOB: 1929/04/24 DOA: 09/26/2015 PCP: [name], MD  Outpatient Specialists: Dr [name].     Brief Narrative: Meghan Castro is a 82 y.o. 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 Last chemo was 1 week ago. Patient has been having dark black stool, which was occult blood positive.    Assessment & Plan:   Principal Problem:   GI bleed Active Problems:   COPD with chronic bronchitis (HCC)   Small cell carcinoma of right lung (HCC)   Antineoplastic chemotherapy induced pancytopenia (HCC)   Diastolic dysfunction with chronic heart failure (HCC)   Protein-calorie malnutrition, severe   Acute post-hemorrhagic anemia   Melena   Acute gastric ulcer   Duodenal ulcer disease  GI bleed, Acute blood loss anemia; Anemia; multiple ulcers gastric, duodenal.  Multifactorial GI bleed, post chemo.  Received 3 units PRBC so far Protonix Gtt. --change to protonix IV BID GI, [place] consulted.  S/P endoscopy: non bleeding ulcer at GE junction, non bleeding gastric ulcer, non bleeding erosive gastropathy, multiple non bleeding duodenal ulcer.  Hb stable at 8. Repeat this afternoon if stable plan to transfer to med-surgery   Pancytopenia; Secondary to recent chemo.  Dr [name] added to rounding  team WBC increased to 3.3 ANC 1. 7 Afebrile, no significant diarrhea. Avoid antibiotics.  Discussed with Dr [name] will give  garnix or until neutrophils count above 1 Has received 2 doses of granix.  Transfuse 1 unit of platelets 5-15.  Platelet increased to 60   Chronic diastolic CHF: LVEF is 60- 65% and has grade 1 diastolic on 2-D echo done on 08/25/2015. strict I&O's and  daily weights  Resume furosemide.    COPD without acute exacerbation: Previously on 2 L of nasal cannula oxygen at home on Continue with spiriva.   Metastatic Small cell lung cancer on chemotherapy: patient sees Dr. Earlie Server of oncology and the outpatient setting. Dr [name] added to rounding team.  Needs Goals of care, defer to oncologist/    DVT prophylaxis: SCD , no anticoagulation in setting of GI bleed.  Code Status: full code.  Family Communication: care discussed with patient.  Disposition Plan: if hb stable this afternoon , plan to transfer to med-surgery    Consultants:   GI  Mohamed added to rounding team.    Procedures:   none   Antimicrobials:  none  Subjective: No BM this am    Objective: Filed Vitals:   09/30/15 0300 09/30/15 0400 09/30/15 0500 09/30/15 0600  BP:  124/50  134/66  Pulse: 104 103 101 97  Temp:  98.9 F (37.2 C)    TempSrc:  Oral    Resp: '25 24 27 26  '$ Height:      Weight:      SpO2: 93% 91% 91% 91%    Intake/Output Summary (Last 24 hours) at 09/30/15 0811 Last data filed at 09/30/15 0600  Gross per 24 hour  Intake  778.5 ml  Output      0 ml  Net  778.5 ml   Filed Weights   09/28/15 0425 09/29/15 0408 09/30/15 0138  Weight: 69 kg (152 lb 1.9 oz) 69.4 kg (153 lb) 68.7 kg (151 lb 7.3 oz)    Examination:  General exam: Appears calm and comfortable  Respiratory system: Clear to auscultation. Respiratory effort normal. Cardiovascular system: S1 & S2 heard, RRR. No JVD, murmurs, rubs, gallops or clicks. No pedal edema. Gastrointestinal system: Abdomen is nondistended, soft and nontender. No organomegaly or masses felt. Normal bowel sounds heard. Central nervous system: Alert and oriented. No focal neurological deficits. Extremities: Symmetric 5 x 5 power. Skin: No rashes, lesions or ulcers     Data Reviewed: I have personally reviewed following labs and imaging studies  CBC:  Recent Labs Lab 09/24/15 1151  09/26/15 1521 09/26/15 1800  09/28/15 0524  09/28/15 2230 09/29/15 0404 09/29/15 1339 09/29/15 2053 09/30/15 0336  WBC 5.9 1.2* 0.7*  < > 1.9*  --   --  3.0* 4.4 4.8 5.5  NEUTROABS 4.4 0.1* 0.0*  --  0.6*  --   --  1.7  --   --   --   HGB 8.8* 6.0* 6.5*  < > 7.9*  < > 8.5* 8.5* 8.7* 8.0* 8.2*  HCT 26.6* 18.5* 20.1*  < > 22.9*  < > 24.9* 24.6* 25.4* 23.3* 24.3*  MCV 89.9 91.6 89.7  < > 86.1  --   --  84.8 85.2 82.9 85.6  PLT 242 137* 91*  < > 55*  --   --  32* 66* 67* 60*  < > = values in this interval not displayed. Basic Metabolic Panel:  Recent Labs Lab 09/26/15 1521 09/26/15 1800 09/28/15 0524 09/29/15 0404 09/30/15 0336  NA 140 139 146* 141 136  K 4.3 4.0 3.2* 3.5 3.1*  CL  --  111 113* 110 105  CO2 '23 22 26 25 26  '$ GLUCOSE 136 115* 100* 89 85  BUN 26.8* 27* 30* 20 13  CREATININE 0.8 0.64 0.63 0.71 0.69  CALCIUM 8.6 8.2* 8.4* 8.2* 8.0*   GFR: Estimated Creatinine Clearance: 46.4 mL/min (by C-G formula based on Cr of 0.69). Liver Function Tests:  Recent Labs Lab 09/24/15 1151 09/26/15 1521  AST 37* 23  ALT 50 42  ALKPHOS 219* 191*  BILITOT 0.69 0.45  PROT 6.3* 6.0*  ALBUMIN 2.9* 2.7*   No results for input(s): LIPASE, AMYLASE in the last 168 hours. No results for input(s): AMMONIA in the last 168 hours. Coagulation Profile: No results for input(s): INR, PROTIME in the last 168 hours. Cardiac Enzymes: No results for input(s): CKTOTAL, CKMB, CKMBINDEX, TROPONINI in the last 168 hours. BNP (last 3 results) No results for input(s): PROBNP in the last 8760 hours. HbA1C: No results for input(s): HGBA1C in the last 72 hours. CBG: No results for input(s): GLUCAP in the last 168 hours. Lipid Profile: No results for input(s): CHOL, HDL, LDLCALC, TRIG, CHOLHDL, LDLDIRECT in the last 72 hours. Thyroid Function Tests: No results for input(s): TSH, T4TOTAL, FREET4, T3FREE, THYROIDAB in the last 72 hours. Anemia Panel: No results for input(s): VITAMINB12, FOLATE,  FERRITIN, TIBC, IRON, RETICCTPCT in the last 72 hours. Urine analysis:    Component Value Date/Time   COLORURINE AMBER* 08/23/2015 2126   APPEARANCEUR CLEAR 08/23/2015 2126   LABSPEC 1.026 08/23/2015 2126   PHURINE 6.0 08/23/2015 2126   GLUCOSEU 100* 08/23/2015 2126   HGBUR NEGATIVE 08/23/2015 2126   BILIRUBINUR NEGATIVE 08/23/2015 2126   KETONESUR NEGATIVE 08/23/2015 2126   PROTEINUR 100* 08/23/2015 2126   NITRITE NEGATIVE 08/23/2015 2126   LEUKOCYTESUR NEGATIVE 08/23/2015 2126   Sepsis Labs: No results for input(s): PROCALCITON, LATICACIDVEN in the last 168 hours.  Recent Results (from the past 240  hour(s))  MRSA PCR Screening     Status: None   Collection Time: 09/26/15 10:29 PM  Result Value Ref Range Status   MRSA by PCR NEGATIVE NEGATIVE Final    Comment:        The GeneXpert MRSA Assay (FDA approved for NASAL specimens only), is one component of a comprehensive MRSA colonization surveillance program. It is not intended to diagnose MRSA infection nor to guide or monitor treatment for MRSA infections.          Radiology Studies: No results found.      Scheduled Meds: . multivitamin with minerals  1 tablet Oral Daily  . omega-3 acid ethyl esters  1 g Oral Daily  . pantoprazole (PROTONIX) IV  40 mg Intravenous Q12H  . potassium chloride  40 mEq Oral Once  . saccharomyces boulardii  250 mg Oral BID  . sodium chloride flush  3 mL Intravenous Q12H  . tiotropium  18 mcg Inhalation Daily   Continuous Infusions:     LOS: 4 days    Time spent: 35 minutes.     Elmarie Shiley, MD [facility] Pager [phone]  If 7PM-7AM, please contact night-coverage [email or web address] Password [email or web address] 09/30/2015, 8:11 AM

## 2015-09-30 NOTE — Progress Notes (Signed)
Patient ID: Meghan Castro, female   DOB: [birth year], 82 y.o.   MRN: [id number]    Progress Note   Subjective  Feels Ok, no c/o abdominal pain,stool this am- brown per nursing Advancing to solid diet Hgb 8.2  Stable past 24 hours plts 60 post platelets yesterday  H pylori serology pending   Objective   Vital signs in last 24 hours: Temp:  [97.9 F (36.6 C)-98.9 F (37.2 C)] 98.9 F (37.2 C) (05/15 0400) Pulse Rate:  [92-108] 97 (05/15 0600) Resp:  [20-36] 26 (05/15 0600) BP: (107-141)/(42-97) 134/66 mmHg (05/15 0600) SpO2:  [90 %-100 %] 91 % (05/15 0600) Weight:  [151 lb 7.3 oz (68.7 kg)] 151 lb 7.3 oz (68.7 kg) (05/15 0138) Last BM Date: 09/29/15 General:   Elderly  white female in NAD Heart:  Regular rate and rhythm; no murmurs Lungs: Respirations even and unlabored, lungs CTA bilaterally Abdomen:  Soft, nontender and nondistended. Normal bowel sounds. Extremities:  Without edema. Neurologic:  Alert and oriented,  grossly normal neurologically. Psych:  Cooperative. Normal mood and affect.  Intake/Output from previous day: 05/14 0701 - 05/15 0700 In: 803.5 [P.O.:75; I.V.:537.5; Blood:191] Out: -  Intake/Output this shift:    Lab Results:  Recent Labs  09/29/15 1339 09/29/15 2053 09/30/15 0336  WBC 4.4 4.8 5.5  HGB 8.7* 8.0* 8.2*  HCT 25.4* 23.3* 24.3*  PLT 66* 67* 60*   BMET  Recent Labs  09/28/15 0524 09/29/15 0404 09/30/15 0336  NA 146* 141 136  K 3.2* 3.5 3.1*  CL 113* 110 105  CO2 '26 25 26  '$ GLUCOSE 100* 89 85  BUN 30* 20 13  CREATININE 0.63 0.71 0.69  CALCIUM 8.4* 8.2* 8.0*        Assessment / Plan:    #1 82 yo female with metastatic lung CA with acute upper GI bleed - multiple ulcers found on EGD - one cratered GE junction ulcer,multiple gastric erosions, and seven duodenal ulcers  All non bleeding  Pt has been stable over weekend, no active bleeding Will follow up on hpylori serology and treat if indicated  Continue BID  PPI- would  leave on BID x 8 weeks the change to Qam indefinitely given multiple ulcers. GI available if can help. Principal Problem:   GI bleed Active Problems:   COPD with chronic bronchitis (HCC)   Small cell carcinoma of right lung (HCC)   Antineoplastic chemotherapy induced pancytopenia (HCC)   Diastolic dysfunction with chronic heart failure (HCC)   Protein-calorie malnutrition, severe   Acute post-hemorrhagic anemia   Melena   Acute gastric ulcer   Duodenal ulcer disease     LOS: 4 days   Zahir Eisenhour  09/30/2015, 9:08 AM

## 2015-09-30 NOTE — Progress Notes (Signed)
Date:  Sep 30, 2015 Chart reviewed for concurrent status and case management needs. Will continue to follow patient for changes and needs: continues to have low wbc 1.3, and bldy stools.  Expected discharge date:  [id number] Velva Harman, [place], [place], [place]

## 2015-09-30 NOTE — Telephone Encounter (Signed)
pt called to cx appt....pt in the hospital

## 2015-10-01 ENCOUNTER — Other Ambulatory Visit: Payer: Medicare Other

## 2015-10-01 LAB — BASIC METABOLIC PANEL WITH GFR
Anion gap: 5 (ref 5–15)
BUN: 12 mg/dL (ref 6–20)
CO2: 27 mmol/L (ref 22–32)
Calcium: 8.2 mg/dL — ABNORMAL LOW (ref 8.9–10.3)
Chloride: 104 mmol/L (ref 101–111)
Creatinine, Ser: 0.66 mg/dL (ref 0.44–1.00)
GFR calc Af Amer: >60 mL/min
GFR calc non Af Amer: >60 mL/min
Glucose, Bld: 92 mg/dL (ref 65–99)
Potassium: 4 mmol/L (ref 3.5–5.1)
Sodium: 136 mmol/L (ref 135–145)

## 2015-10-01 LAB — CBC
HEMATOCRIT: 24.4 % — AB (ref 36.0–46.0)
HEMOGLOBIN: 8.2 g/dL — AB (ref 12.0–15.0)
MCH: 29.2 pg (ref 26.0–34.0)
MCHC: 33.6 g/dL (ref 30.0–36.0)
MCV: 86.8 fL (ref 78.0–100.0)
Platelets: 59 10*3/uL — ABNORMAL LOW (ref 150–400)
RBC: 2.81 MIL/uL — ABNORMAL LOW (ref 3.87–5.11)
RDW: 18.1 % — AB (ref 11.5–15.5)
WBC: 7.7 10*3/uL (ref 4.0–10.5)

## 2015-10-01 MED ORDER — FERROUS SULFATE 325 (65 FE) MG PO TBEC: 325.0000 mg | DELAYED_RELEASE_TABLET | Freq: Three times a day (TID) | ORAL | Status: AC

## 2015-10-01 MED ORDER — PANTOPRAZOLE SODIUM 40 MG PO TBEC: 40.0000 mg | DELAYED_RELEASE_TABLET | Freq: Two times a day (BID) | ORAL | Status: AC

## 2015-10-01 MED ORDER — ALBUTEROL SULFATE (2.5 MG/3ML) 0.083% IN NEBU: 2.5000 mg | INHALATION_SOLUTION | RESPIRATORY_TRACT | Status: AC | PRN

## 2015-10-01 MED ORDER — HEPARIN SOD (PORK) LOCK FLUSH 100 UNIT/ML IV SOLN: 500.0000 [IU] | INTRAVENOUS | Status: DC | PRN

## 2015-10-01 NOTE — [place] FL2 (Signed)
[place] MEDICAID FL2 LEVEL OF CARE SCREENING TOOL     IDENTIFICATION  Patient Name: Meghan Castro Birthdate: [birth year] Sex: female Admission Date (Current Location): 09/26/2015  [facility] and [place] Number:  Herbalist and Address:  [facility],  [place] [address] [place]      Provider Number: [id number]  Attending Physician Name and Address:  Elmarie Shiley, MD  Relative Name and Phone Number:       Current Level of Care: Hospital Recommended Level of Care: [place] Prior Approval Number:    Date Approved/Denied:   PASRR Number:   [id number]  Discharge Plan: SNF    Current Diagnoses: Patient Active Problem List   Diagnosis Date Noted  . Antineoplastic chemotherapy induced pancytopenia ([place]) 09/27/2015  . Diastolic dysfunction with chronic heart failure ([place]) 09/27/2015  . Protein-calorie malnutrition, severe 09/27/2015  . Acute post-hemorrhagic anemia   . Melena   . Acute gastric ulcer   . Duodenal ulcer disease   . GI bleed 09/26/2015  . Physical deconditioning 09/25/2015  . Antineoplastic chemotherapy induced anemia 09/25/2015  . Encounter for antineoplastic chemotherapy 09/03/2015  . Hypoalbuminemia due to protein-calorie malnutrition ([place]) 08/26/2015  . Dehydration 08/26/2015  . Oxygen dependent 08/23/2015  . Small cell carcinoma of right lung ([place]) 07/31/2015  . Mediastinal lymphadenopathy   . Interstitial lung disease ([place]) 05/17/2015  . Heart murmur, systolic 96/29/5284  . Seasonal and perennial allergic rhinitis 01/28/2009  . MACULAR DEGENERATION, BILATERAL 02/02/2008  . Hyperlipemia 08/07/2007  . COPD with chronic bronchitis ([place]) 08/07/2007  . Essential hypertension 07/18/2007  . SINUSITIS, CHRONIC 07/18/2007  . HIATAL HERNIA 07/18/2007  . RENAL INSUFFICIENCY 07/18/2007    Orientation RESPIRATION BLADDER Height & Weight     Self, Time, Situation, Place  O2 (2L) Incontinent Weight: 152 lb 1.9  oz (69 kg) Height:  '5\' 1"'$  (154.9 cm)  BEHAVIORAL SYMPTOMS/MOOD NEUROLOGICAL BOWEL NUTRITION STATUS      Incontinent Diet (low sodium heart healthy)  AMBULATORY STATUS COMMUNICATION OF NEEDS Skin   Extensive Assist Verbally Normal                       Personal Care Assistance Level of Assistance  Bathing, Feeding, Dressing Bathing Assistance: Limited assistance Feeding assistance: Limited assistance Dressing Assistance: Limited assistance     Functional Limitations Info  Sight, Hearing, Speech Sight Info: Adequate Hearing Info: Adequate Speech Info: Adequate    SPECIAL CARE FACTORS FREQUENCY  PT (By licensed PT), OT (By licensed OT)     PT Frequency: 5 OT Frequency: 5            Contractures Contractures Info: Not present    Additional Factors Info  Code Status, Allergies Code Status Info: Full Code Allergies Info: Other, Penicillins, Pravastatin Sodium, Quinolones, Moxifloxacin           Current Medications (10/01/2015):  This is the current hospital active medication list Current Facility-Administered Medications  Medication Dose Route Frequency Provider Last Rate Last Dose  . acetaminophen (TYLENOL) tablet 650 mg  650 mg Oral Q6H PRN Norval Morton, MD       Or  . acetaminophen (TYLENOL) suppository 650 mg  650 mg Rectal Q6H PRN Rondell A Tamala Julian, MD      . albuterol (PROVENTIL) (2.5 MG/3ML) 0.083% nebulizer solution 2.5 mg  2.5 mg Nebulization Q4H PRN Norval Morton, MD      . ALPRAZolam Duanne Moron) tablet 0.25 mg  0.25 mg  Oral TID PRN Norval Morton, MD      . furosemide (LASIX) tablet 40 mg  40 mg Oral Daily Belkys A Regalado, MD   40 mg at 10/01/15 0921  . lidocaine-prilocaine (EMLA) cream 1 application  1 application Topical PRN Norval Morton, MD      . loratadine (CLARITIN) tablet 10 mg  10 mg Oral Daily PRN Norval Morton, MD      . multivitamin with minerals tablet 1 tablet  1 tablet Oral Daily Norval Morton, MD   1 tablet at 10/01/15 0921  .  omega-3 acid ethyl esters (LOVAZA) capsule 1 g  1 g Oral Daily Norval Morton, MD   1 g at 10/01/15 0921  . ondansetron (ZOFRAN) tablet 4 mg  4 mg Oral Q6H PRN Norval Morton, MD       Or  . ondansetron (ZOFRAN) injection 4 mg  4 mg Intravenous Q6H PRN Rondell A Tamala Julian, MD      . pantoprazole (PROTONIX) injection 40 mg  40 mg Intravenous Q12H Belkys A Regalado, MD   40 mg at 10/01/15 0921  . prochlorperazine (COMPAZINE) injection 10 mg  10 mg Intravenous Q4H PRN Norval Morton, MD      . saccharomyces boulardii (FLORASTOR) capsule 250 mg  250 mg Oral BID Belkys A Regalado, MD   250 mg at 10/01/15 0921  . sodium chloride flush (NS) 0.9 % injection 3 mL  3 mL Intravenous Q12H Norval Morton, MD   3 mL at 09/30/15 1042  . tiotropium (SPIRIVA) inhalation capsule 18 mcg  18 mcg Inhalation Daily Norval Morton, MD   18 mcg at 09/30/15 1028  . traMADol (ULTRAM) tablet 50 mg  50 mg Oral Q6H PRN Norval Morton, MD         Discharge Medications: Please see discharge summary for a list of discharge medications.  Relevant Imaging Results:  Relevant Lab Results:   Additional Information SS Number   [id number]  Lilly Cove, LCSW

## 2015-10-01 NOTE — Clinical Social Work Placement (Signed)
   CLINICAL SOCIAL WORK PLACEMENT  NOTE  Date:  10/01/2015  Patient Details  Name: Meghan Castro MRN: [id number] Date of Birth: May 13, 1929  Clinical Social Work is seeking post-discharge placement for this patient at the [place] level of care (*CSW will initial, date and re-position this form in  chart as items are completed):  Yes   Patient/family provided with [facility] Work Department's list of facilities offering this level of care within the geographic area requested by the patient (or if unable, by the patient's family).  Yes   Patient/family informed of their freedom to choose among providers that offer the needed level of care, that participate in Medicare, Medicaid or managed care program needed by the patient, have an available bed and are willing to accept the patient.  Yes   Patient/family informed of [facility]'s ownership interest in [facility] and [facility], as well as of the fact that they are under no obligation to receive care at these facilities.  PASRR submitted to EDS on 10/01/15     PASRR number received on 10/01/15     Existing PASRR number confirmed on 10/01/15     FL2 transmitted to all facilities in geographic area requested by pt/family on 10/01/15     FL2 transmitted to all facilities within larger geographic area on       Patient informed that his/her managed care company has contracts with or will negotiate with certain facilities, including the following:      Yes      Patient/family informed of bed offers received.  10/01/2015   Patient chooses bed at     [facility] recommends and patient chooses bed at     SNF Patient to be transferred to   on  .  10/01/2015   Patient to be transferred to facility by     By family care  Patient family notified on   of transfer.Sister Dare  Name of family member notified:        PHYSICIAN Please prepare prescriptions, Please sign FL2     Additional  Comment:    _______________________________________________ Lilly Cove, LCSW 10/01/2015, 1:53 PM

## 2015-10-01 NOTE — Progress Notes (Signed)
[facility]  Patient Status: Active (receiving services up to time of hospitalization)  [facility] is providing the following services: RN, PT, OT and HHA  If patient discharges after hours, please call [phone].   Meghan Castro 10/01/2015, 11:21 AM

## 2015-10-01 NOTE — Progress Notes (Signed)
Pt transported via [facility] to [place] due to oxygen requirements.

## 2015-10-01 NOTE — Care Management Important Message (Signed)
Important Message  Patient Details  Name: Meghan Castro MRN: [id number] Date of Birth: 1929/02/12   Medicare Important Message Given:  Yes    [name] 10/01/2015, 9:26 AMImportant Message  Patient Details  Name: Meghan Castro MRN: [id number] Date of Birth: 1928/11/23   Medicare Important Message Given:  Yes    [name] 10/01/2015, 9:26 AM

## 2015-10-01 NOTE — Progress Notes (Signed)
10/01/15  1440  Called report to [facility] [phone] report given to [facility]. Pt going to room 3209

## 2015-10-01 NOTE — Progress Notes (Addendum)
Updated:  Patient needs o2 to transport. Patient family does not have any to transport. Patient will go by EMS. Spoke with family who is in agreement as well as SNF to go ahead and send. Updated RN.   Patient accepted to [facility] for DC today. Patient will transport by family who will come pick her up after 3 pm. All clinicals faxed to facility and RN to call report to [facility]. Packet completed for family to take to SNF.  No other needs. SNF aware and agreeable for patient to come today. RN CM and MD made aware of discharge.  Lane Hacker, MSW Clinical Social Work: [facility] [employer] [phone]

## 2015-10-01 NOTE — Progress Notes (Addendum)
Nutrition Follow-up  DOCUMENTATION CODES:   Severe malnutrition in context of acute illness/injury  INTERVENTION:  - Continue Carnation Instant Breakfast BID - Encourage PO intakes of meals and supplements - RD will continue to monitor for needs  NUTRITION DIAGNOSIS:   Increased nutrient needs related to catabolic illness, cancer and cancer related treatments as evidenced by estimated needs. -ongoing  GOAL:   Patient will meet greater than or equal to 90% of their needs -unmet at this time.  MONITOR:   PO intake, Supplement acceptance, Weight trends, Labs, I & O's  ASSESSMENT:   82 y.o. 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 Patient sisters are at bedside and helps provide additional history. Currently, receiving chemotherapy of carboplatin/etoposide, and the last chemotherapy treatment was completed 1 week ago. Patient had just been seen yesterday at the Symptom management clinic with complaints of fatigue, weakness ,and dehydration for which she had been given IV fluids. Today, when the patient was noted to have a dark blackish in color bowel movement while at home. Subsequently upon patient being seen at the [facility] she had another dark bowel movement and they obtained blood work and directed her to the emergency department. They deny any use of any blood thinners and felt that she is on aspirin for prior history of TIA. She complained of lightheadedness and near syncopal episode. Denies having any fever, chest pain, weight gain, headache, cough, congestion, rash, or significant diarrhea. Associated symptoms include a decreased appetite, 8 pound weight loss the last 2-3 weeks, and shortness of  breath. She had previously been on Oxygen, but this was discontinued within the last 1-2 weeks. Patient had only been out of rehabilitation living at home for the last 6 days.  5/16 Diet advancement as follows:  5/11 @ 2116: NPO 5/12 @ 6734: CLD 5/14 @ 1152: FLD 5/15 @ 1937: Heart Healthy 5/15 @ 1147: Regular  No intakes documented since diet advancement began. Pt denies abdominal pain or nausea this AM but states she has not been feeling hungry and did not eat last night or this AM. Pt not interested in prolonged discussion so not much information able to be obtained at this time; no family/visitors present.  Physical assessment shows no muscle or fat wasting at this time but will continue to monitor. Pt not meeting needs. Medications reviewed; 40 mg oral Lasix/day, daily multivitamin with minerals, 40 mEq oral KCl x1 dose 5/15. Labs reviewed; Ca: 8.2 mg/dL.  ADDENDUM: Weight up 2 lbs since previous assessment.    5/12 - Pt has been NPO since admission.  - Spoke with sister at bedside as pt was sleeping throughout discussion..  - Pt was dx with cancer on 07/20/15, per sister, and since that time has lost weight from her UBW of 163 lbs.  - Based on CBW, pt has lost 13 lbs (8% body weight) in the past 2 months which is significant for time frame.  - Unable to complete physical assessment at this time; will attempt at follow-up. - Pt meets criteria for malnutrition based on weight loss and <50% intakes for >/= 5 days PTA.  - Sister states that pt ate well 09/25/15 and ate more that day "than in the past 1.5 weeks combined."  - She states  that since starting chemo pt has had decreased appetite and lack of taste with intakes (bland taste).  - Pt does well with ice cream and Carnation Instant Breakfast. - Since starting chemo, pt has not had abdominal discomfort or nausea; sister states she gives pt antinausea medication every night as a preventative measure.  - She denies pt having  constipation or diarrhea PTA. - She states pt does not have any chewing or swallowing problems.  - Notes indicate last chemo was 09/19/15.   Diet Order:  Diet regular Room service appropriate?: Yes; Fluid consistency:: Thin  Skin:  Reviewed, no issues  Last BM:  5/15  Height:   Ht Readings from Last 1 Encounters:  09/27/15 '5\' 1"'$  (1.549 m)    Weight:   Wt Readings from Last 1 Encounters:  10/01/15 152 lb 1.9 oz (69 kg)    Ideal Body Weight:  50 kg (kg)  BMI:  Body mass index is 28.76 kg/(m^2).  Estimated Nutritional Needs:   Kcal:  1850-2050 (27-30 kcal/kg)  Protein:  95-105 grams  Fluid:  1.8-2 L/day  EDUCATION NEEDS:   No education needs identified at this time     Jarome Matin, RD, LDN Inpatient Clinical Dietitian Pager # [phone] After hours/weekend pager # [phone]

## 2015-10-01 NOTE — Care Management Note (Signed)
Case Management Note  Patient Details  Name: Meghan Castro MRN: [id number] Date of Birth: 1929/01/25  Subjective/Objective: Spoke to patient about d/c plans-She is agreeable to SNF.CSW notified & following.Also noted d/c summary.                   Action/Plan:d/c SNF.   Expected Discharge Date:   (Unknown)               Expected Discharge Plan:  Skilled Nursing Facility  In-House Referral:  NA, Clinical Social Work  Discharge planning Services  CM Consult  Post Acute Care Choice:  NA Choice offered to:  NA  DME Arranged:  N/A DME Agency:     HH Arranged:  NA HH Agency:  NA  Status of Service:  In process, will continue to follow  Medicare Important Message Given:  Yes Date Medicare IM Given:    Medicare IM give by:    Date Additional Medicare IM Given:    Additional Medicare Important Message give by:     If discussed at [place] of Stay Meetings, dates discussed:    Additional Comments:  Dessa Phi, RN 10/01/2015, 12:04 PM

## 2015-10-01 NOTE — Discharge Summary (Signed)
Physician Discharge Summary  Meghan Castro MVH:[id number] DOB: [birth year] DOA: 09/26/2015  PCP: [name], MD  Admit date: 09/26/2015 Discharge date: 10/01/2015  Time spent: 35 minutes  Recommendations for Outpatient Follow-up:  Needs to follow up with Dr [name] for further care on lung cancer and pancytopenia. Please discussed Goals of care with patient.  Needs CBC in 48 hours to follow hemoglobin   Discharge Diagnoses:    Acute gastric ulcer   Duodenal ulcer disease   GI bleed   COPD with chronic bronchitis (HCC)   Small cell carcinoma of right lung (HCC)   Antineoplastic chemotherapy induced pancytopenia (HCC)   Diastolic dysfunction with chronic heart failure (HCC)   Protein-calorie malnutrition, severe   Acute post-hemorrhagic anemia   Melena      Discharge Condition: stable  Diet recommendation: heart healthy   Filed Weights   09/29/15 0408 09/30/15 0138 10/01/15 0442  Weight: 69.4 kg (153 lb) 68.7 kg (151 lb 7.3 oz) 69 kg (152 lb 1.9 oz)    History of present illness:  Meghan Castro is a 82 y.o. 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 Patient sisters are at bedside and helps provide additional history. Currently, receiving chemotherapy of carboplatin/etoposide, and the last chemotherapy treatment was completed 1 week ago. Patient had just been seen yesterday at the Symptom management clinic with complaints of fatigue, weakness ,and dehydration for which she had been given IV fluids. Today, when the patient was noted to have a dark blackish in color bowel movement while at home. Subsequently upon patient being seen at the [facility] she had another dark bowel movement and they  obtained blood work and directed her to the emergency department. They deny any use of any blood thinners and felt that she is on aspirin for prior history of TIA. She complained of lightheadedness and near syncopal episode. Denies having any fever, chest pain, weight gain, headache, cough, congestion, rash, or significant diarrhea. Associated symptoms include a decreased appetite, 8 pound weight loss the last 2-3 weeks, and shortness of breath. She had previously been on Oxygen, but this was discontinued within the last 1-2 weeks. Patient had only been out of rehabilitation living at home for the last 6 days. Family notes that there've not been any sick contacts around her recently as they have assigned on the door of their home.  ED Course: Upon admission to the emergency department patient was seen to be afebrile, tachycardia heart rates up to 112, respiratory rate up to 26, O2 saturations maintain room air, and blood pressure as low as 91/51. Initial laboratory revealed a WBC 0.1, ANC 0.1, hemoglobin 6.5,   Hospital Course:  GI bleed, Acute blood loss anemia; Anemia; multiple ulcers gastric, duodenal.  Multifactorial GI bleed, post chemo.  Received 3 units PRBC so far Protonix Gtt. --change to protonix P.O  BID GI, Grubbs consulted.  S/P endoscopy: non bleeding ulcer at GE junction, non bleeding gastric ulcer, non bleeding erosive gastropathy, multiple non bleeding duodenal ulcer.  Hb stable at 8. Platelet stable. No further melena.   Pancytopenia; Secondary to recent chemo.  Dr [name] added to rounding team WBC increased to 3.3 ANC 1. 7 Afebrile, no significant diarrhea. Avoid antibiotics.  Discussed with Dr [name] will give  garnix or until neutrophils count above 1 Has received 2 doses of granix.  Transfuse 1 unit of platelets 5-15.  Platelet stable at 59 Discharge of iron supplements.   Chronic diastolic CHF: LVEF is 60- 65% and has grade 1 diastolic on 2-D echo done on  08/25/2015. strict I&O's and daily weights  Resume furosemide.    COPD without acute exacerbation: Previously on 2 L of nasal cannula oxygen at home on Continue with spiriva.   Metastatic Small cell lung cancer on chemotherapy: patient sees Dr. Earlie Server of oncology and the outpatient setting. Dr [name] added to rounding team.  Needs Goals of care, defer to oncologist/    Procedures:  Endoscopy ;non bleeding ulcer at GE junction, non bleeding gastric ulcer, non bleeding erosive gastropathy, multiple non bleeding duodenal ulcer.   Consultations:  GI  Dr [name], phone consultation  Discharge Exam: Filed Vitals:   09/30/15 2018 10/01/15 0442  BP: 116/54 125/64  Pulse: 101 97  Temp: 99.4 F (37.4 C) 98.2 F (36.8 C)  Resp: 22 22    General: NAD Cardiovascular: S 1, S 2 RRR Respiratory: CTA  Discharge Instructions   Discharge Instructions    Diet - low sodium heart healthy    Complete by:  As directed      Increase activity slowly    Complete by:  As directed           Current Discharge Medication List    START taking these medications   Details  albuterol (PROVENTIL) (2.5 MG/3ML) 0.083% nebulizer solution Take 3 mLs (2.5 mg total) by nebulization every 4 (four) hours as needed for wheezing or shortness of breath. Qty: 75 mL, Refills: 12    ferrous sulfate 325 (65 FE) MG EC tablet Take 1 tablet (325 mg total) by mouth 3 (three) times daily with meals. Qty: 30 tablet, Refills: 3    pantoprazole (PROTONIX) 40 MG tablet Take 1 tablet (40 mg total) by mouth 2 (two) times daily. Qty: 60 tablet, Refills: 1      CONTINUE these medications which have NOT CHANGED   Details  ALPRAZolam (XANAX) 0.25 MG tablet Take 1 tablet (0.25 mg total) by mouth 3 (three) times daily as needed for anxiety. Qty: 30 tablet, Refills: 0    furosemide (LASIX) 40 MG tablet Take 1 tablet (40 mg total) by mouth daily. Qty: 30 tablet    lidocaine-prilocaine (EMLA) cream Apply 1  application topically as needed. Qty: 30 g, Refills: 4    loratadine (CLARITIN) 10 MG tablet Take 10 mg by mouth daily as needed (pain).     MELATONIN PO Take 10 mg by mouth daily.    Multiple Vitamin (MULTIVITAMIN WITH MINERALS) TABS tablet Take 1 tablet by mouth daily.    Omega-3 Fatty Acids (FISH OIL) 1000 MG CAPS Take 1 capsule by mouth daily.    PROAIR HFA 108 (90 BASE) MCG/ACT inhaler INHALE 2 PUFFS INTO THE LUNGS EVERY 4 (FOUR) HOURS AS NEEDED FOR WHEEZING OR SHORTNESS OF BREATH. Qty: 8.5 Inhaler, Refills: 0    prochlorperazine (COMPAZINE) 10 MG tablet Take 1 tablet (10 mg total) by mouth every 6 (six) hours as needed for nausea or vomiting. Qty: 30 tablet, Refills: 0    Tiotropium Bromide Monohydrate (SPIRIVA RESPIMAT) 2.5 MCG/ACT AERS Inhale 2 puffs into the lungs daily. Qty: 1 Inhaler, Refills: 12    traMADol (ULTRAM) 50 MG tablet Take 1 tablet (50 mg total) by mouth every 6 (six) hours as needed. Qty: 30 tablet, Refills:  0    saccharomyces boulardii (FLORASTOR) 250 MG capsule Take 1 capsule (250 mg total) by mouth 2 (two) times daily.      STOP taking these medications     aspirin 325 MG EC tablet      Ginger, Zingiber officinalis, (GINGER ROOT) 500 MG CAPS      losartan (COZAAR) 50 MG tablet        Allergies  Allergen Reactions  . Other Shortness Of Breath    *Perfumes*  . Penicillins     Has patient had a PCN reaction causing immediate rash, facial/tongue/throat swelling, SOB or lightheadedness with hypotension: No Has patient had a PCN reaction causing severe rash involving mucus membranes or skin necrosis: No Has patient had a PCN reaction that required hospitalization No Has patient had a PCN reaction occurring within the last 10 years: No If all of the above answers are "NO", then may proceed with Cephalosporin use.   . Pravastatin Sodium     REACTION: swelling in tongue and feet turn black  . Quinolones Other (See Comments)    hyper  . Moxifloxacin  Anxiety   Follow-up Information    Follow up with [name], MD.   Specialty:  Family Medicine   Contact information:   [place] [id number] [phone]       Follow up with Eilleen Kempf., MD In 1 week.   Specialty:  Oncology   Contact information:   [address] [place] [address] [phone]        The results of significant diagnostics from this hospitalization (including imaging, microbiology, ancillary and laboratory) are listed below for reference.    Significant Diagnostic Studies: Dg Chest Port 1 View  09/26/2015  CLINICAL DATA:  History of lung carcinoma. Gastrointestinal bleeding EXAM: PORTABLE CHEST 1 VIEW COMPARISON:  August 29, 2015 FINDINGS: There is elevation of the left hemidiaphragm. There is scarring in the right mid lung and left base regions. There is diffuse reticulonodular interstitial prominence, likely representing underlying fibrotic type change. No appreciable airspace consolidation. Heart size is upper normal with pulmonary vascularity within normal limits. Port-A-Cath tip is in the superior vena cava. No adenopathy. No bone lesions. IMPRESSION: Areas of interstitial fibrosis and scarring. Elevation left hemidiaphragm. No airspace consolidation. Stable cardiac silhouette. Note that chronic congestive heart failure superimposed cannot be excluded radiographically. Electronically Signed   By: Lowella Grip III M.D.   On: 09/26/2015 20:08    Microbiology: Recent Results (from the past 240 hour(s))  MRSA PCR Screening     Status: None   Collection Time: 09/26/15 10:29 PM  Result Value Ref Range Status   MRSA by PCR NEGATIVE NEGATIVE Final    Comment:        The GeneXpert MRSA Assay (FDA approved for NASAL specimens only), is one component of a comprehensive MRSA colonization surveillance program. It is not intended to diagnose MRSA infection nor to guide or monitor treatment for MRSA infections.       Labs: Basic Metabolic Panel:  Recent Labs Lab 09/26/15 1800 09/28/15 0524 09/29/15 0404 09/30/15 0336 10/01/15 0400  NA 139 146* 141 136 136  K 4.0 3.2* 3.5 3.1* 4.0  CL 111 113* 110 105 104  CO2 '22 26 25 26 27  '$ GLUCOSE 115* 100* 89 85 92  BUN 27* 30* '20 13 12  '$ CREATININE 0.64 0.63 0.71 0.69 0.66  CALCIUM 8.2* 8.4* 8.2* 8.0* 8.2*   Liver Function Tests:  Recent Labs Lab 09/24/15  1151 09/26/15 1521  AST 37* 23  ALT 50 42  ALKPHOS 219* 191*  BILITOT 0.69 0.45  PROT 6.3* 6.0*  ALBUMIN 2.9* 2.7*   No results for input(s): LIPASE, AMYLASE in the last 168 hours. No results for input(s): AMMONIA in the last 168 hours. CBC:  Recent Labs Lab 09/24/15 1151 09/26/15 1521 09/26/15 1800  09/28/15 0524  09/29/15 0404 09/29/15 1339 09/29/15 2053 09/30/15 0336 09/30/15 1519 10/01/15 0400  WBC 5.9 1.2* 0.7*  < > 1.9*  --  3.0* 4.4 4.8 5.5  --  7.7  NEUTROABS 4.4 0.1* 0.0*  --  0.6*  --  1.7  --   --   --   --   --   HGB 8.8* 6.0* 6.5*  < > 7.9*  < > 8.5* 8.7* 8.0* 8.2* 9.4* 8.2*  HCT 26.6* 18.5* 20.1*  < > 22.9*  < > 24.6* 25.4* 23.3* 24.3* 27.7* 24.4*  MCV 89.9 91.6 89.7  < > 86.1  --  84.8 85.2 82.9 85.6  --  86.8  PLT 242 137* 91*  < > 55*  --  32* 66* 67* 60*  --  59*  < > = values in this interval not displayed. Cardiac Enzymes: No results for input(s): CKTOTAL, CKMB, CKMBINDEX, TROPONINI in the last 168 hours. BNP: BNP (last 3 results)  Recent Labs  08/23/15 2025 08/26/15 0535  BNP 202.6* 271.6*    ProBNP (last 3 results) No results for input(s): PROBNP in the last 8760 hours.  CBG: No results for input(s): GLUCAP in the last 168 hours.     Signed:  Elmarie Shiley MD.  [facility] 10/01/2015, 10:38 AM

## 2015-10-01 NOTE — Clinical Social Work Note (Signed)
Clinical Social Work Assessment  Patient Details  Name: Meghan Castro MRN: [id number] Date of Birth: 06-Aug-1928  Date of referral:  10/01/15               Reason for consult:  Facility Placement                Permission sought to share information with:  Case Manager, Customer service manager, Family Supports Permission granted to share information::  Yes, Verbal Permission Granted  Name::        Agency::  SNF [place]  Relationship::  sister  Contact Information:     Housing/Transportation Living arrangements for the past 2 months:  [place], [place] of Information:  Patient, Medical Team, Case Manager Patient Interpreter Needed:  None Criminal Activity/Legal Involvement Pertinent to Current Situation/Hospitalization:  No - Comment as needed Significant Relationships:  Other Family Members Lives with:  Self Do you feel safe going back to the place where you live?  No Need for family participation in patient care:  Yes (Comment)  Care giving concerns:  Patient was scheduled for discharge home today per case manager with home health, but reports she would like SNF to assist with continued physical therapy. Patient reports her sister helps take care of her in the home and involved in care, asked for her involvement in choosing bed for SNF. Patient reports she recently discharged from a SNF earlier this year [facility]) and does not want to return. Open to SNF if they can help her become more independent. She is also open with home health per CM   Social Worker assessment / plan:  LCSW met with patient discussed role and educated patient on SNF. Patient aware and agreeable and does not have a bed preference. LCSW completed FL2 and obtained passar LCSW faxed patient out and awaiting bed offers. Will present offers today to sister who patient asked to contact.  Will follow up with place ment.  SNF:TBD  Employment status:   Retired Nurse, adult PT Recommendations:  [employer] / Referral to community resources:  [place]  Patient/Family's Response to care:  Agreeable to plan  Patient/Family's Understanding of and Emotional Response to Diagnosis, Current Treatment, and Prognosis:  Patient very blunt and irritable during assessment. Aware of SNF and agreeable at this time as she reports she needs more assistance at home than she can manage.   Emotional Assessment Appearance:  Appears stated age Attitude/Demeanor/Rapport:  Other (cooperative and blunt) Affect (typically observed):  Irritable Orientation:  Oriented to Self, Oriented to Place, Oriented to  Time, Oriented to Situation Alcohol / Substance use:  Not Applicable Psych involvement (Current and /or in the community):  No (Comment)  Discharge Needs  Concerns to be addressed:  No discharge needs identified Readmission within the last 30 days:  No Current discharge risk:  None Barriers to Discharge:  No Barriers Identified   [place], LCSW 10/01/2015, 1:20 PM

## 2015-10-01 NOTE — Progress Notes (Signed)
10/01/15  1500  Paged Dr Tyrell Antonio to get order to deaccess port a cath. Per MD no heparin flush due to patient PLT 59. IV team notified that MD does not want port a cath HepLock d/t PLT count.

## 2015-10-04 ENCOUNTER — Ambulatory Visit (HOSPITAL_COMMUNITY)
Admission: RE | Admit: 2015-10-04 | Discharge: 2015-10-04 | Disposition: A | Discharge status: Home / Self Care | Payer: No Typology Code available for payment source | Source: Ambulatory Visit | Attending: Internal Medicine | Admitting: Internal Medicine

## 2015-10-04 ENCOUNTER — Encounter (HOSPITAL_COMMUNITY): Payer: Self-pay

## 2015-10-04 ENCOUNTER — Other Ambulatory Visit (HOSPITAL_BASED_OUTPATIENT_CLINIC_OR_DEPARTMENT_OTHER): Payer: Medicare Other

## 2015-10-04 ENCOUNTER — Ambulatory Visit (HOSPITAL_BASED_OUTPATIENT_CLINIC_OR_DEPARTMENT_OTHER): Payer: Medicare Other

## 2015-10-04 VITALS — BP 117/76 | HR 105 | Temp 97.7°F | Resp 18

## 2015-10-04 DIAGNOSIS — M4854XA Collapsed vertebra, not elsewhere classified, thoracic region, initial encounter for fracture: Secondary | ICD-10-CM | POA: Diagnosis not present

## 2015-10-04 DIAGNOSIS — C787 Secondary malignant neoplasm of liver and intrahepatic bile duct: Secondary | ICD-10-CM | POA: Diagnosis not present

## 2015-10-04 DIAGNOSIS — Z5111 Encounter for antineoplastic chemotherapy: Secondary | ICD-10-CM

## 2015-10-04 DIAGNOSIS — Z9221 Personal history of antineoplastic chemotherapy: Secondary | ICD-10-CM | POA: Diagnosis not present

## 2015-10-04 DIAGNOSIS — C3491 Malignant neoplasm of unspecified part of right bronchus or lung: Secondary | ICD-10-CM

## 2015-10-04 DIAGNOSIS — Z95828 Presence of other vascular implants and grafts: Secondary | ICD-10-CM

## 2015-10-04 DIAGNOSIS — I714 Abdominal aortic aneurysm, without rupture: Secondary | ICD-10-CM | POA: Insufficient documentation

## 2015-10-04 DIAGNOSIS — M4856XA Collapsed vertebra, not elsewhere classified, lumbar region, initial encounter for fracture: Secondary | ICD-10-CM | POA: Insufficient documentation

## 2015-10-04 DIAGNOSIS — C771 Secondary and unspecified malignant neoplasm of intrathoracic lymph nodes: Secondary | ICD-10-CM | POA: Diagnosis not present

## 2015-10-04 LAB — CBC WITH DIFFERENTIAL/PLATELET
BASO%: 0.4 % (ref 0.0–2.0)
Basophils Absolute: 0.1 10*3/uL (ref 0.0–0.1)
EOS%: 0 % (ref 0.0–7.0)
Eosinophils Absolute: 0 10*3/uL (ref 0.0–0.5)
HCT: 29.4 % — ABNORMAL LOW (ref 34.8–46.6)
HGB: 9.5 g/dL — ABNORMAL LOW (ref 11.6–15.9)
LYMPH%: 14.4 % (ref 14.0–49.7)
MCH: 28.4 pg (ref 25.1–34.0)
MCHC: 32.4 g/dL (ref 31.5–36.0)
MCV: 87.5 fL (ref 79.5–101.0)
MONO#: 2.1 10*3/uL — ABNORMAL HIGH (ref 0.1–0.9)
MONO%: 10.5 % (ref 0.0–14.0)
NEUT#: 15.2 10*3/uL — ABNORMAL HIGH (ref 1.5–6.5)
NEUT%: 74.7 % (ref 38.4–76.8)
Platelets: 204 10*3/uL (ref 145–400)
RBC: 3.36 10*6/uL — ABNORMAL LOW (ref 3.70–5.45)
RDW: 17.8 % — ABNORMAL HIGH (ref 11.2–14.5)
WBC: 20.3 10*3/uL — ABNORMAL HIGH (ref 3.9–10.3)
lymph#: 2.9 10*3/uL (ref 0.9–3.3)

## 2015-10-04 LAB — COMPREHENSIVE METABOLIC PANEL
ALBUMIN: 2.6 g/dL — AB (ref 3.5–5.0)
ALK PHOS: 250 U/L — AB (ref 40–150)
ALT: 35 U/L (ref 0–55)
AST: 34 U/L (ref 5–34)
Anion Gap: 8 mEq/L (ref 3–11)
BUN: 17.2 mg/dL (ref 7.0–26.0)
CO2: 29 meq/L (ref 22–29)
Calcium: 8.9 mg/dL (ref 8.4–10.4)
Chloride: 97 mEq/L — ABNORMAL LOW (ref 98–109)
Creatinine: 1 mg/dL (ref 0.6–1.1)
EGFR: 51 mL/min/{1.73_m2} — AB (ref >90)
GLUCOSE: 102 mg/dL (ref 70–140)
POTASSIUM: 3.9 meq/L (ref 3.5–5.1)
SODIUM: 134 meq/L — AB (ref 136–145)
Total Bilirubin: <0.3 mg/dL (ref 0.20–1.20)
Total Protein: 6.6 g/dL (ref 6.4–8.3)

## 2015-10-04 MED ORDER — IOPAMIDOL (ISOVUE-300) INJECTION 61%
100.0000 mL | Freq: Once | INTRAVENOUS | Status: AC | PRN
  Administered 2015-10-04: 100 mL via INTRAVENOUS

## 2015-10-04 MED ORDER — SODIUM CHLORIDE 0.9% FLUSH
10.0000 mL | INTRAVENOUS | Status: DC | PRN
  Administered 2015-10-04: 10 mL via INTRAVENOUS
  Filled 2015-10-04: qty 10

## 2015-10-04 NOTE — Patient Instructions (Signed)

## 2015-10-08 ENCOUNTER — Ambulatory Visit: Payer: Medicare Other | Admitting: Nutrition

## 2015-10-08 ENCOUNTER — Ambulatory Visit: Payer: Medicare Other

## 2015-10-08 ENCOUNTER — Telehealth: Payer: Self-pay | Admitting: Internal Medicine

## 2015-10-08 ENCOUNTER — Encounter: Payer: Self-pay | Admitting: Internal Medicine

## 2015-10-08 ENCOUNTER — Encounter: Payer: Self-pay | Admitting: *Deleted

## 2015-10-08 ENCOUNTER — Ambulatory Visit (HOSPITAL_BASED_OUTPATIENT_CLINIC_OR_DEPARTMENT_OTHER): Payer: Medicare Other | Admitting: Internal Medicine

## 2015-10-08 ENCOUNTER — Encounter: Payer: Self-pay | Admitting: General Practice

## 2015-10-08 ENCOUNTER — Other Ambulatory Visit (HOSPITAL_BASED_OUTPATIENT_CLINIC_OR_DEPARTMENT_OTHER): Payer: Medicare Other

## 2015-10-08 VITALS — BP 116/65 | HR 108 | Temp 97.4°F | Resp 18 | Ht 61.0 in | Wt 151.7 lb

## 2015-10-08 DIAGNOSIS — K259 Gastric ulcer, unspecified as acute or chronic, without hemorrhage or perforation: Secondary | ICD-10-CM

## 2015-10-08 DIAGNOSIS — D61810 Antineoplastic chemotherapy induced pancytopenia: Secondary | ICD-10-CM

## 2015-10-08 DIAGNOSIS — Z9981 Dependence on supplemental oxygen: Secondary | ICD-10-CM

## 2015-10-08 DIAGNOSIS — T451X5A Adverse effect of antineoplastic and immunosuppressive drugs, initial encounter: Secondary | ICD-10-CM

## 2015-10-08 DIAGNOSIS — Z5111 Encounter for antineoplastic chemotherapy: Secondary | ICD-10-CM

## 2015-10-08 DIAGNOSIS — C787 Secondary malignant neoplasm of liver and intrahepatic bile duct: Secondary | ICD-10-CM

## 2015-10-08 DIAGNOSIS — R0602 Shortness of breath: Secondary | ICD-10-CM

## 2015-10-08 DIAGNOSIS — C3491 Malignant neoplasm of unspecified part of right bronchus or lung: Secondary | ICD-10-CM

## 2015-10-08 DIAGNOSIS — C3411 Malignant neoplasm of upper lobe, right bronchus or lung: Secondary | ICD-10-CM

## 2015-10-08 DIAGNOSIS — K2210 Ulcer of esophagus without bleeding: Secondary | ICD-10-CM | POA: Diagnosis not present

## 2015-10-08 DIAGNOSIS — Z95828 Presence of other vascular implants and grafts: Secondary | ICD-10-CM

## 2015-10-08 LAB — COMPREHENSIVE METABOLIC PANEL
ALT: 27 U/L (ref 0–55)
AST: 32 U/L (ref 5–34)
Albumin: 2.4 g/dL — ABNORMAL LOW (ref 3.5–5.0)
Alkaline Phosphatase: 212 U/L — ABNORMAL HIGH (ref 40–150)
Anion Gap: 8 mEq/L (ref 3–11)
BILIRUBIN TOTAL: 0.3 mg/dL (ref 0.20–1.20)
BUN: 18 mg/dL (ref 7.0–26.0)
CHLORIDE: 101 meq/L (ref 98–109)
CO2: 30 meq/L — AB (ref 22–29)
CREATININE: 1 mg/dL (ref 0.6–1.1)
Calcium: 8.9 mg/dL (ref 8.4–10.4)
EGFR: 52 mL/min/{1.73_m2} — ABNORMAL LOW (ref >90)
Glucose: 128 mg/dl (ref 70–140)
Potassium: 3.3 mEq/L — ABNORMAL LOW (ref 3.5–5.1)
Sodium: 139 mEq/L (ref 136–145)
TOTAL PROTEIN: 6.4 g/dL (ref 6.4–8.3)

## 2015-10-08 LAB — CBC WITH DIFFERENTIAL/PLATELET
BASO%: 0.5 % (ref 0.0–2.0)
Basophils Absolute: 0.1 10*3/uL (ref 0.0–0.1)
EOS ABS: 0 10*3/uL (ref 0.0–0.5)
EOS%: 0.1 % (ref 0.0–7.0)
HEMATOCRIT: 27.1 % — AB (ref 34.8–46.6)
HGB: 8.8 g/dL — ABNORMAL LOW (ref 11.6–15.9)
LYMPH#: 2.4 10*3/uL (ref 0.9–3.3)
LYMPH%: 17.6 % (ref 14.0–49.7)
MCH: 29.8 pg (ref 25.1–34.0)
MCHC: 32.5 g/dL (ref 31.5–36.0)
MCV: 91.9 fL (ref 79.5–101.0)
MONO#: 1.8 10*3/uL — AB (ref 0.1–0.9)
MONO%: 13.8 % (ref 0.0–14.0)
NEUT%: 68 % (ref 38.4–76.8)
NEUTROS ABS: 9.1 10*3/uL — AB (ref 1.5–6.5)
PLATELETS: 336 10*3/uL (ref 145–400)
RBC: 2.95 10*6/uL — AB (ref 3.70–5.45)
RDW: 20.2 % — ABNORMAL HIGH (ref 11.2–14.5)
WBC: 13.4 10*3/uL — ABNORMAL HIGH (ref 3.9–10.3)

## 2015-10-08 MED ORDER — SODIUM CHLORIDE 0.9% FLUSH
10.0000 mL | INTRAVENOUS | Status: DC | PRN
  Administered 2015-10-08: 10 mL via INTRAVENOUS
  Filled 2015-10-08: qty 10

## 2015-10-08 MED ORDER — SODIUM CHLORIDE 0.9% FLUSH
10.0000 mL | INTRAVENOUS | Status: DC | PRN
  Administered 2015-10-08: 10 mL
  Filled 2015-10-08: qty 10

## 2015-10-08 MED ORDER — HEPARIN SOD (PORK) LOCK FLUSH 100 UNIT/ML IV SOLN
500.0000 [IU] | Freq: Once | INTRAVENOUS | Status: AC | PRN
  Administered 2015-10-08: 500 [IU]
  Filled 2015-10-08: qty 5

## 2015-10-08 NOTE — Telephone Encounter (Signed)
Gave and printed appt sched and avs for pt for June °

## 2015-10-08 NOTE — Progress Notes (Addendum)
[place] [phone]   [phone]  OFFICE PROGRESS NOTE  [name], MD [place] [address]  DIAGNOSIS: Extensive stage (T2a, N2, M1b) small cell lung cancer presented with right upper lobe lung mass in addition to mediastinal lymphadenopathy and liver metastases diagnosed in March 2017.  PRIOR THERAPY: None  CURRENT THERAPY: Systemic chemotherapy with carboplatin for AUC of 4 on day 1 and etoposide 80 MG/M2 on days 1, 2 and 3 with Neulasta support on day 4. First cycle will start on 08/06/2015 was reduced dose of carboplatin for AUC of 4 and etoposide 90 MG/M2. She is status post 2 cycles.  INTERVAL HISTORY: Meghan Castro 82 y.o. female returns to the clinic today for follow-up visit accompanied by her sister. The patient is still complaining of fatigue and weakness. She was admitted recently to [facility] with upper GI bleed and she was found to have several esophageal and gastric ulcer. The patient continues to have mild shortness of breath and currently on home oxygen but on as-needed basis. She has no cough, chest pain or hemoptysis. She denied having any significant weight loss or night sweats. She denied having any nausea or vomiting, no fever or chills. She had repeat CT scan of the chest, abdomen and pelvis performed recently and she is here for evaluation and discussion of her scan results.  MEDICAL HISTORY: Past Medical History  Diagnosis Date  . Chronic airway obstruction, not elsewhere classified   . Unspecified sinusitis (chronic)   . Diaphragmatic hernia without mention of obstruction or gangrene   . Macular degeneration (senile) of retina, unspecified   . Other and unspecified hyperlipidemia   . Unspecified essential hypertension   . Unspecified disorder resulting from impaired renal function   . Mediastinal adenopathy   . Cancer [facility])     metastatic lung cancer  . TIA (transient ischemic  attack)   . Small cell carcinoma of right lung ([place]) 07/31/2015  . PONV (postoperative nausea and vomiting)     Nausea/Vomiting     ALLERGIES:  is allergic to other; penicillins; pravastatin sodium; quinolones; and moxifloxacin.  MEDICATIONS:  Current Outpatient Prescriptions  Medication Sig Dispense Refill  . albuterol (PROVENTIL) (2.5 MG/3ML) 0.083% nebulizer solution Take 3 mLs (2.5 mg total) by nebulization every 4 (four) hours as needed for wheezing or shortness of breath. 75 mL 12  . ALPRAZolam (XANAX) 0.25 MG tablet Take 1 tablet (0.25 mg total) by mouth 3 (three) times daily as needed for anxiety. 30 tablet 0  . ferrous sulfate 325 (65 FE) MG EC tablet Take 1 tablet (325 mg total) by mouth 3 (three) times daily with meals. 30 tablet 3  . furosemide (LASIX) 40 MG tablet Take 1 tablet (40 mg total) by mouth daily. 30 tablet   . lidocaine-prilocaine (EMLA) cream Apply 1 application topically as needed. (Patient taking differently: Apply 1 application topically as needed (port). ) 30 g 4  . loratadine (CLARITIN) 10 MG tablet Take 10 mg by mouth daily as needed (pain).     . MELATONIN PO Take 10 mg by mouth daily.    . Multiple Vitamin (MULTIVITAMIN WITH MINERALS) TABS tablet Take 1 tablet by mouth daily.    . Omega-3 Fatty Acids (FISH OIL) 1000 MG CAPS Take 1 capsule by mouth daily.    . pantoprazole (PROTONIX) 40 MG tablet Take 1 tablet (40 mg total) by mouth 2 (two) times daily. 60 tablet 1  .  PROAIR HFA 108 (90 BASE) MCG/ACT inhaler INHALE 2 PUFFS INTO THE LUNGS EVERY 4 (FOUR) HOURS AS NEEDED FOR WHEEZING OR SHORTNESS OF BREATH. 8.5 Inhaler 0  . prochlorperazine (COMPAZINE) 10 MG tablet Take 1 tablet (10 mg total) by mouth every 6 (six) hours as needed for nausea or vomiting. 30 tablet 0  . saccharomyces boulardii (FLORASTOR) 250 MG capsule Take 1 capsule (250 mg total) by mouth 2 (two) times daily. (Patient not taking: Reported on 09/26/2015)    . Tiotropium Bromide Monohydrate (SPIRIVA  RESPIMAT) 2.5 MCG/ACT AERS Inhale 2 puffs into the lungs daily. 1 Inhaler 12  . traMADol (ULTRAM) 50 MG tablet Take 1 tablet (50 mg total) by mouth every 6 (six) hours as needed. (Patient taking differently: Take 50 mg by mouth every 6 (six) hours as needed (pain.). ) 30 tablet 0   No current facility-administered medications for this visit.    SURGICAL HISTORY:  Past Surgical History  Procedure Laterality Date  . Arm fracture    . Cholecystectomy    . Total abdominal hysterectomy    . Bilateral salpingoophorectomy    . Incisional hernia repair      abdominal  . Appendectomy    . Cataract extraction    . Endobronchial ultrasound Bilateral 07/24/2015    Procedure: ENDOBRONCHIAL ULTRASOUND;  Surgeon: [name], MD;  Location: [facility] END[facility]COPY;  Service: Cardiopulmonary;  Laterality: Bilateral;  . 2-d echocardiogram    . Esophagogastroduodenoscopy N/A 09/27/2015    Procedure: ESOPHAGOGASTRODUODENOSCOPY (EGD);  Surgeon: Jerene Bears, MD;  Location: [facility];  Service: Endoscopy;  Laterality: N/A;    REVIEW OF SYSTEMS:  Constitutional: positive for fatigue Eyes: negative Ears, nose, mouth, throat, and face: negative Respiratory: positive for dyspnea on exertion Cardiovascular: negative Gastrointestinal: negative Genitourinary:negative Integument/breast: negative Hematologic/lymphatic: negative Musculoskeletal:negative Neurological: negative Behavioral/Psych: negative Endocrine: negative Allergic/Immunologic: negative   PHYSICAL EXAMINATION: General appearance: alert, cooperative, fatigued and no distress Head: Normocephalic, without obvious abnormality, atraumatic Neck: no adenopathy, no JVD, supple, symmetrical, trachea midline and thyroid not enlarged, symmetric, no tenderness/mass/nodules Lymph nodes: Cervical, supraclavicular, and axillary nodes normal. Resp: wheezes bilaterally Back: symmetric, no curvature. ROM normal. No CVA tenderness. Cardio: regular rate and  rhythm, S1, S2 normal, no murmur, click, rub or gallop and normal apical impulse GI: soft, non-tender; bowel sounds normal; no masses,  no organomegaly Extremities: extremities normal, atraumatic, no cyanosis or edema Neurologic: Alert and oriented X 3, normal strength and tone. Normal symmetric reflexes. Normal coordination and gait  ECOG PERFORMANCE STATUS: 1 - Symptomatic but completely ambulatory  Blood pressure 116/65, pulse 108, temperature 97.4 F (36.3 C), temperature source Oral, resp. rate 18, height '5\' 1"'$  (1.549 m), weight 151 lb 11.2 oz (68.811 kg), SpO2 100 %.  LABORATORY DATA: Lab Results  Component Value Date   WBC 13.4* 10/08/2015   HGB 8.8* 10/08/2015   HCT 27.1* 10/08/2015   MCV 91.9 10/08/2015   PLT 336 10/08/2015      Chemistry      Component Value Date/Time   NA 139 10/08/2015 1050   NA 136 10/01/2015 0400   K 3.3* 10/08/2015 1050   K 4.0 10/01/2015 0400   CL 104 10/01/2015 0400   CO2 30* 10/08/2015 1050   CO2 27 10/01/2015 0400   BUN 18.0 10/08/2015 1050   BUN 12 10/01/2015 0400   CREATININE 1.0 10/08/2015 1050   CREATININE 0.66 10/01/2015 0400      Component Value Date/Time   CALCIUM 8.9 10/08/2015 1050   CALCIUM  8.2* 10/01/2015 0400   ALKPHOS 212* 10/08/2015 1050   ALKPHOS 163* 08/24/2015 0450   AST 32 10/08/2015 1050   AST 32 08/24/2015 0450   ALT 27 10/08/2015 1050   ALT 48 08/24/2015 0450   BILITOT 0.30 10/08/2015 1050   BILITOT 0.8 08/24/2015 0450       RADIOGRAPHIC STUDIES: Ct Chest W Contrast  10/04/2015  CLINICAL DATA:  Lung cancer restaging. EXAM: CT CHEST, ABDOMEN, AND PELVIS WITH CONTRAST TECHNIQUE: Multidetector CT imaging of the chest, abdomen and pelvis was performed following the standard protocol during bolus administration of intravenous contrast. CONTRAST:  128m ISOVUE-300 IOPAMIDOL (ISOVUE-300) INJECTION 61% COMPARISON:  CT chest 07/20/2015 and PET-CT from 08/12/2015. FINDINGS: CT CHEST FINDINGS Mediastinum/Lymph Nodes:  Index right paratracheal lymph node Measures 2.6 x 2.3 cm, image 19 of series 2. Previously 3.1 x 3.2 cm. The index pre-vascular lymph node adjacent to the SVC has resolved in the interval. Sub- carinal lymph node Measures 1.4 cm, image 25 of series 2. Previously 2.7 cm the periapical at the right perihilar lung mass measure 2.1 by 3.1 cm, image 29 of series 2. Previously this measured 4.9 x 4.8 cm. Lungs/Pleura: Small right pleural effusion. Anterior right upper lobe lung lesion measures 1.4 x 1.3 cm, image 43 of series 5. Previously 1.7 x 1.4 cm. Stable small nodule in the anterior left upper lobe measuring 4 mm, image 48 of series 5. Musculoskeletal: The bones appear diffusely osteopenic. There is a compression fracture involving T4 and T10 vertebra, new from previous exam. CT ABDOMEN PELVIS FINDINGS Hepatobiliary: Significant improvement in multifocal liver metastases. Many of the lesions on the previous exam are no longer measurable. Index lesion within the posterior right lobe of liver measures 2.1 cm, image 56 of series 2. Previously 3.1 cm. Index lesion within the central right lobe of liver measures 2.4 cm, image 44 of series 2 previously 3.4 cm. Previous cholecystectomy. No biliary dilatation Pancreas: No inflammation or mass identified. Spleen: Within normal limits in size and appearance. Adrenals/Urinary Tract: Normal adrenal glands. Bilateral renal cysts are identified.Moderate distension of the urinary bladder. No focal abnormality. Stomach/Bowel: Very large hiatal hernia is noted. No pathologic dilatation of the small or large bowel loops. Distal colonic diverticula noted, no acute inflammation. Vascular/Lymphatic: Aortic atherosclerosis noted. Infrarenal abdominal aortic aneurysm measures 3.3 cm, image 70 of series 2. No upper abdominal adenopathy. No pelvic or inguinal adenopathy. Reproductive: Previous hysterectomy.  No adnexal mass. Other: There is no ascites or focal fluid collections within the  abdomen or pelvis. Musculoskeletal: There is degenerative disc disease noted. Compression deformity involving the L1 vertebra is identified. There is loss of approximately 50% of the vertebral body height. New from previous exam. IMPRESSION: 1. Interval response to therapy. 2. Interval improvement in mediastinal and hilar metastatic adenopathy. Right upper lobe tumor has also decreased in size in the interval. 3. Interval improvement in it diffuse liver metastases. 4. There are new compression fractures involving T4, T10 and L1. These may be treatment related or reflect pathologic fractures due to underlying bone metastases. 5. Abdominal aortic aneurysm. Recommend followup by ultrasound in 3 years. This recommendation follows ACR consensus guidelines: White Paper of the ACR Incidental Findings Committee II on Vascular Findings. JNatasha MeadColl Radiol 2013; 10:789-794 Electronically Signed   By: TKerby MoorsM.D.   On: 10/04/2015 16:05   Ct Abdomen Pelvis W Contrast  10/04/2015  CLINICAL DATA:  Lung cancer restaging. EXAM: CT CHEST, ABDOMEN, AND PELVIS WITH CONTRAST TECHNIQUE: Multidetector CT  imaging of the chest, abdomen and pelvis was performed following the standard protocol during bolus administration of intravenous contrast. CONTRAST:  130m ISOVUE-300 IOPAMIDOL (ISOVUE-300) INJECTION 61% COMPARISON:  CT chest 07/20/2015 and PET-CT from 08/12/2015. FINDINGS: CT CHEST FINDINGS Mediastinum/Lymph Nodes: Index right paratracheal lymph node Measures 2.6 x 2.3 cm, image 19 of series 2. Previously 3.1 x 3.2 cm. The index pre-vascular lymph node adjacent to the SVC has resolved in the interval. Sub- carinal lymph node Measures 1.4 cm, image 25 of series 2. Previously 2.7 cm the periapical at the right perihilar lung mass measure 2.1 by 3.1 cm, image 29 of series 2. Previously this measured 4.9 x 4.8 cm. Lungs/Pleura: Small right pleural effusion. Anterior right upper lobe lung lesion measures 1.4 x 1.3 cm, image 43 of  series 5. Previously 1.7 x 1.4 cm. Stable small nodule in the anterior left upper lobe measuring 4 mm, image 48 of series 5. Musculoskeletal: The bones appear diffusely osteopenic. There is a compression fracture involving T4 and T10 vertebra, new from previous exam. CT ABDOMEN PELVIS FINDINGS Hepatobiliary: Significant improvement in multifocal liver metastases. Many of the lesions on the previous exam are no longer measurable. Index lesion within the posterior right lobe of liver measures 2.1 cm, image 56 of series 2. Previously 3.1 cm. Index lesion within the central right lobe of liver measures 2.4 cm, image 44 of series 2 previously 3.4 cm. Previous cholecystectomy. No biliary dilatation Pancreas: No inflammation or mass identified. Spleen: Within normal limits in size and appearance. Adrenals/Urinary Tract: Normal adrenal glands. Bilateral renal cysts are identified.Moderate distension of the urinary bladder. No focal abnormality. Stomach/Bowel: Very large hiatal hernia is noted. No pathologic dilatation of the small or large bowel loops. Distal colonic diverticula noted, no acute inflammation. Vascular/Lymphatic: Aortic atherosclerosis noted. Infrarenal abdominal aortic aneurysm measures 3.3 cm, image 70 of series 2. No upper abdominal adenopathy. No pelvic or inguinal adenopathy. Reproductive: Previous hysterectomy.  No adnexal mass. Other: There is no ascites or focal fluid collections within the abdomen or pelvis. Musculoskeletal: There is degenerative disc disease noted. Compression deformity involving the L1 vertebra is identified. There is loss of approximately 50% of the vertebral body height. New from previous exam. IMPRESSION: 1. Interval response to therapy. 2. Interval improvement in mediastinal and hilar metastatic adenopathy. Right upper lobe tumor has also decreased in size in the interval. 3. Interval improvement in it diffuse liver metastases. 4. There are new compression fractures involving  T4, T10 and L1. These may be treatment related or reflect pathologic fractures due to underlying bone metastases. 5. Abdominal aortic aneurysm. Recommend followup by ultrasound in 3 years. This recommendation follows ACR consensus guidelines: White Paper of the ACR Incidental Findings Committee II on Vascular Findings. JNatasha MeadColl Radiol 2013; 10:789-794 Electronically Signed   By: TKerby MoorsM.D.   On: 10/04/2015 16:05   Dg Chest Port 1 View  09/26/2015  CLINICAL DATA:  History of lung carcinoma. Gastrointestinal bleeding EXAM: PORTABLE CHEST 1 VIEW COMPARISON:  August 29, 2015 FINDINGS: There is elevation of the left hemidiaphragm. There is scarring in the right mid lung and left base regions. There is diffuse reticulonodular interstitial prominence, likely representing underlying fibrotic type change. No appreciable airspace consolidation. Heart size is upper normal with pulmonary vascularity within normal limits. Port-A-Cath tip is in the superior vena cava. No adenopathy. No bone lesions. IMPRESSION: Areas of interstitial fibrosis and scarring. Elevation left hemidiaphragm. No airspace consolidation. Stable cardiac silhouette. Note that chronic congestive heart  failure superimposed cannot be excluded radiographically. Electronically Signed   By: Lowella Grip III M.D.   On: 09/26/2015 20:08    ASSESSMENT AND PLAN: This is a very pleasant 82 years old white female recently diagnosed with extensive stage small cell lung cancer with liver metastasis. The patient was started on systemic chemotherapy with reduced dose carboplatin and etoposide status post 2 cycles. She tolerated the second cycle of her treatment fairly well except for the recent admission to [facility] with upper gastrointestinal bleed secondary to multiple esophageal and gastric ulcers.  She is feeling much better today. The recent CT scan of the chest, abdomen and pelvis showed significant improvement of her disease. I  discussed the scan results with the patient and her sister today. I recommended for her to continue her current treatment with carboplatin and etoposide but I will delay the start of cycle #3 by 2 weeks until the patient recover from the recent admission and upper gastrointestinal bleed. The patient will need a wheelchair as she cannot push herself in a heavier , standard weight wheelchair due to arm weakness, physical deconditioning. She would come back for follow-up visit in 2 weeks for evaluation before resuming her systemic chemotherapy. She was advised to call immediately if she has any concerning symptoms in the interval. The patient voices understanding of current disease status and treatment options and is in agreement with the current care plan.  All questions were answered. The patient knows to call the clinic with any problems, questions or concerns. We can certainly see the patient much sooner if necessary.  Disclaimer: This note was dictated with voice recognition software. Similar sounding words can inadvertently be transcribed and may not be corrected upon review.

## 2015-10-08 NOTE — Progress Notes (Signed)
82 year old female diagnosed with lung cancer.  Past medical history includes hyperlipidemia, and TIA.  Medications include Xanax, ferrous sulfate, Lasix, multivitamin, Compazine, and florastor.  Medications include sodium 134, albumin 2.6 on May 19.  Height: 5 feet 1 inch Weight: 151.7 pounds May 23. BMI: 28.68.  Patient presents with her sister. Chemotherapy with canceled today. Patient resides at [facility] and reports she has no taste for food. States her food is cold when it arrives to her room. She is not motivated to eat much. Reports they bring her a small cup of a "protein drink" several times a day.  Nutrition diagnosis:  Food and nutrition related knowledge deficit related to lung cancer and associated treatments as evidenced by no prior need for nutrition related information.  Intervention:  I educated patient on the importance of consuming adequate calories and protein to promote weight stabilization and reduce the loss of muscle mass. Explained how eating high protein foods can help rebuild muscle/improve strength Encouraged patient to communicate with staff at [facility] regarding food preferences Encouraged patient to eat in the dining room where she has other people to eat with and food may be more appealing. Fact sheets were provided.  Teach back method was used.  Monitoring, evaluation, goals: Patient will tolerate increased calories and protein to minimize further weight loss and improve strength/quality-of-life.  Next visit: To be scheduled as needed.  **Disclaimer: This note was dictated with voice recognition software. Similar sounding words can inadvertently be transcribed and this note may contain transcription errors which may not have been corrected upon publication of note.**

## 2015-10-08 NOTE — Progress Notes (Signed)
Oncology Nurse Navigator Documentation  Oncology Nurse Navigator Flowsheets 10/08/2015  Navigator Location [facility] On[facility]  Navigator Encounter Type Clinic/MDC  Patient Visit Type MedOnc  Treatment Phase Treatment  Barriers/Navigation Needs Education  Education Other  Interventions Education Method;Coordination of Care  Coordination of Care Other  Education Method Verbal  Acuity Level 2  Time Spent with Patient 89   Spoke with patient and sister today at [facility].  Her treatment was held today and she will have a follow up in 2 weeks.  According to Dr. [name], she needs to get stronger.  I gave her some tips to help on getting stronger.    She is set up to see CSW and Dietitian.  I called Mickle Mallory and Lauren to update. I was unable to reach Lauren but left her messages on her office phone and mobile.

## 2015-10-08 NOTE — Patient Instructions (Signed)

## 2015-10-09 ENCOUNTER — Ambulatory Visit: Payer: Medicare Other

## 2015-10-09 NOTE — Progress Notes (Signed)
Spiritual Care Note  Referred by Pamala Hurry Neff/RD for emotional support.  Met Ms Nies and her sister as they prepared to leave scheduling to introduce Spiritual Care and Support Team staff.  Ms Wyche appeared emotionally down and flat, citing discouragement at loss of mobility and concomitant loss of independence.  Per pt, she gets winded quickly, which makes it hard to talk fluidly and thus hinders communication when friends do visit.  Her sister adds that friends' support has decreased through progression of her physical needs, including bout of C.diff and current stay at [facility].  Ms Echevarria was very tired and eager to leave, but was open to f/u support.  Plan to connect with her in person on a future visit to [facility], but please also page as needs arise/circumstances change.  Thank you.  [place], [place], [facility] Pager [phone] Voicemail [phone]

## 2015-10-10 ENCOUNTER — Telehealth: Payer: Self-pay | Admitting: Medical Oncology

## 2015-10-10 ENCOUNTER — Ambulatory Visit: Payer: Medicare Other

## 2015-10-10 NOTE — Telephone Encounter (Signed)
rx and office note faxed to [facility]

## 2015-10-12 ENCOUNTER — Ambulatory Visit: Payer: Medicare Other

## 2015-10-15 ENCOUNTER — Other Ambulatory Visit: Payer: Medicare Other

## 2015-10-15 ENCOUNTER — Ambulatory Visit: Payer: Medicare Other | Admitting: Internal Medicine

## 2015-10-21 ENCOUNTER — Telehealth: Payer: Self-pay | Admitting: Medical Oncology

## 2015-10-21 NOTE — Telephone Encounter (Signed)
FYI-pt " started on levaquin for patchy,  bilateral infiltrations on CXR".

## 2015-10-22 ENCOUNTER — Telehealth: Payer: Self-pay | Admitting: Internal Medicine

## 2015-10-22 ENCOUNTER — Ambulatory Visit (HOSPITAL_BASED_OUTPATIENT_CLINIC_OR_DEPARTMENT_OTHER): Payer: Medicare Other | Admitting: Internal Medicine

## 2015-10-22 ENCOUNTER — Other Ambulatory Visit (HOSPITAL_BASED_OUTPATIENT_CLINIC_OR_DEPARTMENT_OTHER): Payer: Medicare Other

## 2015-10-22 ENCOUNTER — Encounter: Payer: Self-pay | Admitting: Internal Medicine

## 2015-10-22 ENCOUNTER — Ambulatory Visit: Payer: Medicare Other

## 2015-10-22 VITALS — BP 123/62 | HR 97 | Temp 98.7°F | Resp 18 | Ht 61.0 in | Wt 154.2 lb

## 2015-10-22 DIAGNOSIS — R531 Weakness: Secondary | ICD-10-CM

## 2015-10-22 DIAGNOSIS — C3411 Malignant neoplasm of upper lobe, right bronchus or lung: Secondary | ICD-10-CM | POA: Diagnosis not present

## 2015-10-22 DIAGNOSIS — C787 Secondary malignant neoplasm of liver and intrahepatic bile duct: Secondary | ICD-10-CM

## 2015-10-22 DIAGNOSIS — Z5111 Encounter for antineoplastic chemotherapy: Secondary | ICD-10-CM

## 2015-10-22 DIAGNOSIS — C3491 Malignant neoplasm of unspecified part of right bronchus or lung: Secondary | ICD-10-CM

## 2015-10-22 DIAGNOSIS — R0602 Shortness of breath: Secondary | ICD-10-CM

## 2015-10-22 LAB — CBC WITH DIFFERENTIAL/PLATELET
BASO%: 0.3 % (ref 0.0–2.0)
BASOS ABS: 0 10*3/uL (ref 0.0–0.1)
EOS ABS: 0.2 10*3/uL (ref 0.0–0.5)
EOS%: 1.5 % (ref 0.0–7.0)
HEMATOCRIT: 30.1 % — AB (ref 34.8–46.6)
HEMOGLOBIN: 9.5 g/dL — AB (ref 11.6–15.9)
LYMPH#: 2.2 10*3/uL (ref 0.9–3.3)
LYMPH%: 18 % (ref 14.0–49.7)
MCH: 29.2 pg (ref 25.1–34.0)
MCHC: 31.6 g/dL (ref 31.5–36.0)
MCV: 92.6 fL (ref 79.5–101.0)
MONO#: 1.4 10*3/uL — ABNORMAL HIGH (ref 0.1–0.9)
MONO%: 11.4 % (ref 0.0–14.0)
NEUT%: 68.8 % (ref 38.4–76.8)
NEUTROS ABS: 8.3 10*3/uL — AB (ref 1.5–6.5)
Platelets: 319 10*3/uL (ref 145–400)
RBC: 3.25 10*6/uL — ABNORMAL LOW (ref 3.70–5.45)
RDW: 20.4 % — AB (ref 11.2–14.5)
WBC: 12.1 10*3/uL — AB (ref 3.9–10.3)

## 2015-10-22 LAB — COMPREHENSIVE METABOLIC PANEL
ALBUMIN: 2.5 g/dL — AB (ref 3.5–5.0)
ALK PHOS: 192 U/L — AB (ref 40–150)
ALT: 32 U/L (ref 0–55)
AST: 47 U/L — AB (ref 5–34)
Anion Gap: 10 mEq/L (ref 3–11)
BILIRUBIN TOTAL: 0.33 mg/dL (ref 0.20–1.20)
BUN: 17.8 mg/dL (ref 7.0–26.0)
CALCIUM: 9.4 mg/dL (ref 8.4–10.4)
CO2: 27 mEq/L (ref 22–29)
CREATININE: 0.9 mg/dL (ref 0.6–1.1)
Chloride: 100 mEq/L (ref 98–109)
EGFR: 56 mL/min/{1.73_m2} — ABNORMAL LOW (ref >90)
GLUCOSE: 111 mg/dL (ref 70–140)
Potassium: 4.1 mEq/L (ref 3.5–5.1)
SODIUM: 138 meq/L (ref 136–145)
TOTAL PROTEIN: 7 g/dL (ref 6.4–8.3)

## 2015-10-22 NOTE — Progress Notes (Signed)
[facility] [phone]   [phone]  OFFICE PROGRESS NOTE  [name], MD [place] [address]  DIAGNOSIS: Extensive stage (T2a, N2, M1b) small cell lung cancer presented with right upper lobe lung mass in addition to mediastinal lymphadenopathy and liver metastases diagnosed in March 2017.  PRIOR THERAPY: None  CURRENT THERAPY: Systemic chemotherapy with carboplatin for AUC of 4 on day 1 and etoposide 80 MG/M2 on days 1, 2 and 3 with Neulasta support on day 4. First cycle will start on 08/06/2015 was reduced dose of carboplatin for AUC of 4 and etoposide 90 MG/M2. She is status post 2 cycles.  INTERVAL HISTORY: Meghan Castro 82 y.o. female returns to the clinic today for follow-up visit accompanied by her sister. The patient is still complaining of fatigue and weakness. She is currently a resident of the [facility] skilled nursing facility. She is undergoing physical therapy and rehabilitation but she continues to have significant weakness especially in the lower extremities. The patient continues to have mild shortness of breath and currently on home oxygen but on as-needed basis. She has no cough, chest pain or hemoptysis. She denied having any significant weight loss or night sweats. She denied having any nausea or vomiting, no fever or chills. She is here today for evaluation before resuming her systemic therapy.  MEDICAL HISTORY: Past Medical History  Diagnosis Date  . Chronic airway obstruction, not elsewhere classified   . Unspecified sinusitis (chronic)   . Diaphragmatic hernia without mention of obstruction or gangrene   . Macular degeneration (senile) of retina, unspecified   . Other and unspecified hyperlipidemia   . Unspecified essential hypertension   . Unspecified disorder resulting from impaired renal function   . Mediastinal adenopathy   . Cancer [facility])     metastatic lung cancer  . TIA (transient  ischemic attack)   . Small cell carcinoma of right lung ([place]) 07/31/2015  . PONV (postoperative nausea and vomiting)     Nausea/Vomiting     ALLERGIES:  is allergic to other; penicillins; pravastatin sodium; quinolones; and moxifloxacin.  MEDICATIONS:  Current Outpatient Prescriptions  Medication Sig Dispense Refill  . albuterol (PROVENTIL) (2.5 MG/3ML) 0.083% nebulizer solution Take 3 mLs (2.5 mg total) by nebulization every 4 (four) hours as needed for wheezing or shortness of breath. 75 mL 12  . ALPRAZolam (XANAX) 0.25 MG tablet Take 1 tablet (0.25 mg total) by mouth 3 (three) times daily as needed for anxiety. 30 tablet 0  . ferrous sulfate 325 (65 FE) MG EC tablet Take 1 tablet (325 mg total) by mouth 3 (three) times daily with meals. 30 tablet 3  . furosemide (LASIX) 40 MG tablet Take 1 tablet (40 mg total) by mouth daily. 30 tablet   . levofloxacin (LEVAQUIN) 500 MG tablet Take 500 mg by mouth daily.    Marland Kitchen levothyroxine (SYNTHROID, LEVOTHROID) 25 MCG tablet Take 25 mcg by mouth daily before breakfast.    . lidocaine-prilocaine (EMLA) cream Apply 1 application topically as needed. (Patient taking differently: Apply 1 application topically as needed (port). ) 30 g 4  . loratadine (CLARITIN) 10 MG tablet Take 10 mg by mouth daily as needed (pain).     . MELATONIN PO Take 10 mg by mouth daily.    . Multiple Vitamin (MULTIVITAMIN WITH MINERALS) TABS tablet Take 1 tablet by mouth daily.    . Omega-3 Fatty Acids (FISH OIL) 1000 MG CAPS Take 1 capsule by mouth  daily.    . pantoprazole (PROTONIX) 40 MG tablet Take 1 tablet (40 mg total) by mouth 2 (two) times daily. 60 tablet 1  . PROAIR HFA 108 (90 BASE) MCG/ACT inhaler INHALE 2 PUFFS INTO THE LUNGS EVERY 4 (FOUR) HOURS AS NEEDED FOR WHEEZING OR SHORTNESS OF BREATH. 8.5 Inhaler 0  . saccharomyces boulardii (FLORASTOR) 250 MG capsule Take 1 capsule (250 mg total) by mouth 2 (two) times daily.    . Tiotropium Bromide Monohydrate (SPIRIVA  RESPIMAT) 2.5 MCG/ACT AERS Inhale 2 puffs into the lungs daily. 1 Inhaler 12  . prochlorperazine (COMPAZINE) 10 MG tablet Take 1 tablet (10 mg total) by mouth every 6 (six) hours as needed for nausea or vomiting. (Patient not taking: Reported on 10/22/2015) 30 tablet 0  . traMADol (ULTRAM) 50 MG tablet Take 1 tablet (50 mg total) by mouth every 6 (six) hours as needed. (Patient not taking: Reported on 10/22/2015) 30 tablet 0   No current facility-administered medications for this visit.    SURGICAL HISTORY:  Past Surgical History  Procedure Laterality Date  . Arm fracture    . Cholecystectomy    . Total abdominal hysterectomy    . Bilateral salpingoophorectomy    . Incisional hernia repair      abdominal  . Appendectomy    . Cataract extraction    . Endobronchial ultrasound Bilateral 07/24/2015    Procedure: ENDOBRONCHIAL ULTRASOUND;  Surgeon: [name], MD;  Location: [facility] ENDOSCOPY;  Service: Cardiopulmonary;  Laterality: Bilateral;  . 2-d echocardiogram    . Esophagogastroduodenoscopy N/A 09/27/2015    Procedure: ESOPHAGOGASTRODUODENOSCOPY (EGD);  Surgeon: Jerene Bears, MD;  Location: [facility];  Service: Endoscopy;  Laterality: N/A;    REVIEW OF SYSTEMS:  A comprehensive review of systems was negative except for: Constitutional: positive for fatigue Respiratory: positive for cough and dyspnea on exertion Musculoskeletal: positive for muscle weakness   PHYSICAL EXAMINATION: General appearance: alert, cooperative, fatigued and no distress Head: Normocephalic, without obvious abnormality, atraumatic Neck: no adenopathy, no JVD, supple, symmetrical, trachea midline and thyroid not enlarged, symmetric, no tenderness/mass/nodules Lymph nodes: Cervical, supraclavicular, and axillary nodes normal. Resp: wheezes bilaterally Back: symmetric, no curvature. ROM normal. No CVA tenderness. Cardio: regular rate and rhythm, S1, S2 normal, no murmur, click, rub or gallop and normal apical  impulse GI: soft, non-tender; bowel sounds normal; no masses,  no organomegaly Extremities: extremities normal, atraumatic, no cyanosis or edema Neurologic: Alert and oriented X 3, normal strength and tone. Normal symmetric reflexes. Normal coordination and gait  ECOG PERFORMANCE STATUS: 2 - Symptomatic, <50% confined to bed  Blood pressure 123/62, pulse 97, temperature 98.7 F (37.1 C), temperature source Oral, resp. rate 18, height '5\' 1"'$  (1.549 m), weight 154 lb 3.2 oz (69.945 kg), SpO2 97 %.  LABORATORY DATA: Lab Results  Component Value Date   WBC 12.1* 10/22/2015   HGB 9.5* 10/22/2015   HCT 30.1* 10/22/2015   MCV 92.6 10/22/2015   PLT 319 10/22/2015      Chemistry      Component Value Date/Time   NA 138 10/22/2015 1104   NA 136 10/01/2015 0400   K 4.1 10/22/2015 1104   K 4.0 10/01/2015 0400   CL 104 10/01/2015 0400   CO2 27 10/22/2015 1104   CO2 27 10/01/2015 0400   BUN 17.8 10/22/2015 1104   BUN 12 10/01/2015 0400   CREATININE 0.9 10/22/2015 1104   CREATININE 0.66 10/01/2015 0400      Component Value Date/Time  CALCIUM 9.4 10/22/2015 1104   CALCIUM 8.2* 10/01/2015 0400   ALKPHOS 192* 10/22/2015 1104   ALKPHOS 163* 08/24/2015 0450   AST 47* 10/22/2015 1104   AST 32 08/24/2015 0450   ALT 32 10/22/2015 1104   ALT 48 08/24/2015 0450   BILITOT 0.33 10/22/2015 1104   BILITOT 0.8 08/24/2015 0450       RADIOGRAPHIC STUDIES: Ct Chest W Contrast  10/04/2015  CLINICAL DATA:  Lung cancer restaging. EXAM: CT CHEST, ABDOMEN, AND PELVIS WITH CONTRAST TECHNIQUE: Multidetector CT imaging of the chest, abdomen and pelvis was performed following the standard protocol during bolus administration of intravenous contrast. CONTRAST:  126m ISOVUE-300 IOPAMIDOL (ISOVUE-300) INJECTION 61% COMPARISON:  CT chest 07/20/2015 and PET-CT from 08/12/2015. FINDINGS: CT CHEST FINDINGS Mediastinum/Lymph Nodes: Index right paratracheal lymph node Measures 2.6 x 2.3 cm, image 19 of series 2.  Previously 3.1 x 3.2 cm. The index pre-vascular lymph node adjacent to the SVC has resolved in the interval. Sub- carinal lymph node Measures 1.4 cm, image 25 of series 2. Previously 2.7 cm the periapical at the right perihilar lung mass measure 2.1 by 3.1 cm, image 29 of series 2. Previously this measured 4.9 x 4.8 cm. Lungs/Pleura: Small right pleural effusion. Anterior right upper lobe lung lesion measures 1.4 x 1.3 cm, image 43 of series 5. Previously 1.7 x 1.4 cm. Stable small nodule in the anterior left upper lobe measuring 4 mm, image 48 of series 5. Musculoskeletal: The bones appear diffusely osteopenic. There is a compression fracture involving T4 and T10 vertebra, new from previous exam. CT ABDOMEN PELVIS FINDINGS Hepatobiliary: Significant improvement in multifocal liver metastases. Many of the lesions on the previous exam are no longer measurable. Index lesion within the posterior right lobe of liver measures 2.1 cm, image 56 of series 2. Previously 3.1 cm. Index lesion within the central right lobe of liver measures 2.4 cm, image 44 of series 2 previously 3.4 cm. Previous cholecystectomy. No biliary dilatation Pancreas: No inflammation or mass identified. Spleen: Within normal limits in size and appearance. Adrenals/Urinary Tract: Normal adrenal glands. Bilateral renal cysts are identified.Moderate distension of the urinary bladder. No focal abnormality. Stomach/Bowel: Very large hiatal hernia is noted. No pathologic dilatation of the small or large bowel loops. Distal colonic diverticula noted, no acute inflammation. Vascular/Lymphatic: Aortic atherosclerosis noted. Infrarenal abdominal aortic aneurysm measures 3.3 cm, image 70 of series 2. No upper abdominal adenopathy. No pelvic or inguinal adenopathy. Reproductive: Previous hysterectomy.  No adnexal mass. Other: There is no ascites or focal fluid collections within the abdomen or pelvis. Musculoskeletal: There is degenerative disc disease noted.  Compression deformity involving the L1 vertebra is identified. There is loss of approximately 50% of the vertebral body height. New from previous exam. IMPRESSION: 1. Interval response to therapy. 2. Interval improvement in mediastinal and hilar metastatic adenopathy. Right upper lobe tumor has also decreased in size in the interval. 3. Interval improvement in it diffuse liver metastases. 4. There are new compression fractures involving T4, T10 and L1. These may be treatment related or reflect pathologic fractures due to underlying bone metastases. 5. Abdominal aortic aneurysm. Recommend followup by ultrasound in 3 years. This recommendation follows ACR consensus guidelines: White Paper of the ACR Incidental Findings Committee II on Vascular Findings. JNatasha MeadColl Radiol 2013; 10:789-794 Electronically Signed   By: TKerby MoorsM.D.   On: 10/04/2015 16:05   Ct Abdomen Pelvis W Contrast  10/04/2015  CLINICAL DATA:  Lung cancer restaging. EXAM: CT CHEST, ABDOMEN,  AND PELVIS WITH CONTRAST TECHNIQUE: Multidetector CT imaging of the chest, abdomen and pelvis was performed following the standard protocol during bolus administration of intravenous contrast. CONTRAST:  136m ISOVUE-300 IOPAMIDOL (ISOVUE-300) INJECTION 61% COMPARISON:  CT chest 07/20/2015 and PET-CT from 08/12/2015. FINDINGS: CT CHEST FINDINGS Mediastinum/Lymph Nodes: Index right paratracheal lymph node Measures 2.6 x 2.3 cm, image 19 of series 2. Previously 3.1 x 3.2 cm. The index pre-vascular lymph node adjacent to the SVC has resolved in the interval. Sub- carinal lymph node Measures 1.4 cm, image 25 of series 2. Previously 2.7 cm the periapical at the right perihilar lung mass measure 2.1 by 3.1 cm, image 29 of series 2. Previously this measured 4.9 x 4.8 cm. Lungs/Pleura: Small right pleural effusion. Anterior right upper lobe lung lesion measures 1.4 x 1.3 cm, image 43 of series 5. Previously 1.7 x 1.4 cm. Stable small nodule in the anterior left  upper lobe measuring 4 mm, image 48 of series 5. Musculoskeletal: The bones appear diffusely osteopenic. There is a compression fracture involving T4 and T10 vertebra, new from previous exam. CT ABDOMEN PELVIS FINDINGS Hepatobiliary: Significant improvement in multifocal liver metastases. Many of the lesions on the previous exam are no longer measurable. Index lesion within the posterior right lobe of liver measures 2.1 cm, image 56 of series 2. Previously 3.1 cm. Index lesion within the central right lobe of liver measures 2.4 cm, image 44 of series 2 previously 3.4 cm. Previous cholecystectomy. No biliary dilatation Pancreas: No inflammation or mass identified. Spleen: Within normal limits in size and appearance. Adrenals/Urinary Tract: Normal adrenal glands. Bilateral renal cysts are identified.Moderate distension of the urinary bladder. No focal abnormality. Stomach/Bowel: Very large hiatal hernia is noted. No pathologic dilatation of the small or large bowel loops. Distal colonic diverticula noted, no acute inflammation. Vascular/Lymphatic: Aortic atherosclerosis noted. Infrarenal abdominal aortic aneurysm measures 3.3 cm, image 70 of series 2. No upper abdominal adenopathy. No pelvic or inguinal adenopathy. Reproductive: Previous hysterectomy.  No adnexal mass. Other: There is no ascites or focal fluid collections within the abdomen or pelvis. Musculoskeletal: There is degenerative disc disease noted. Compression deformity involving the L1 vertebra is identified. There is loss of approximately 50% of the vertebral body height. New from previous exam. IMPRESSION: 1. Interval response to therapy. 2. Interval improvement in mediastinal and hilar metastatic adenopathy. Right upper lobe tumor has also decreased in size in the interval. 3. Interval improvement in it diffuse liver metastases. 4. There are new compression fractures involving T4, T10 and L1. These may be treatment related or reflect pathologic  fractures due to underlying bone metastases. 5. Abdominal aortic aneurysm. Recommend followup by ultrasound in 3 years. This recommendation follows ACR consensus guidelines: White Paper of the ACR Incidental Findings Committee II on Vascular Findings. JNatasha MeadColl Radiol 2013; 10:789-794 Electronically Signed   By: TKerby MoorsM.D.   On: 10/04/2015 16:05   Dg Chest Port 1 View  09/26/2015  CLINICAL DATA:  History of lung carcinoma. Gastrointestinal bleeding EXAM: PORTABLE CHEST 1 VIEW COMPARISON:  August 29, 2015 FINDINGS: There is elevation of the left hemidiaphragm. There is scarring in the right mid lung and left base regions. There is diffuse reticulonodular interstitial prominence, likely representing underlying fibrotic type change. No appreciable airspace consolidation. Heart size is upper normal with pulmonary vascularity within normal limits. Port-A-Cath tip is in the superior vena cava. No adenopathy. No bone lesions. IMPRESSION: Areas of interstitial fibrosis and scarring. Elevation left hemidiaphragm. No airspace consolidation. Stable  cardiac silhouette. Note that chronic congestive heart failure superimposed cannot be excluded radiographically. Electronically Signed   By: Lowella Grip III M.D.   On: 09/26/2015 20:08    ASSESSMENT AND PLAN: This is a very pleasant 82 years old white female recently diagnosed with extensive stage small cell lung cancer with liver metastasis. The patient was started on systemic chemotherapy with reduced dose carboplatin and etoposide status post 2 cycles.  The restaging CT scan of the chest, abdomen and pelvis showed significant improvement of her disease. She has been off treatment for the last few weeks secondary to significant weakness and fatigue. She still undergoing rehabilitation at a skilled nursing facility. I recommended for the patient to delay the start of cycle #3 by 2 more week until she has improvement in her condition. She is too weak to  tolerate the systemic chemotherapy. She would come back for follow-up visit in 2 weeks for evaluation before resuming her systemic chemotherapy. She was advised to call immediately if she has any concerning symptoms in the interval. The patient voices understanding of current disease status and treatment options and is in agreement with the current care plan.  All questions were answered. The patient knows to call the clinic with any problems, questions or concerns. We can certainly see the patient much sooner if necessary.  Disclaimer: This note was dictated with voice recognition software. Similar sounding words can inadvertently be transcribed and may not be corrected upon review.

## 2015-10-22 NOTE — Telephone Encounter (Signed)
Gave and printed appt sched and avs for pt for June and July  °

## 2015-10-23 ENCOUNTER — Ambulatory Visit: Payer: Medicare Other

## 2015-10-24 ENCOUNTER — Ambulatory Visit: Payer: Medicare Other

## 2015-10-26 ENCOUNTER — Ambulatory Visit: Payer: Medicare Other

## 2015-10-29 ENCOUNTER — Other Ambulatory Visit: Payer: Medicare Other

## 2015-10-29 ENCOUNTER — Encounter: Payer: Self-pay | Admitting: *Deleted

## 2015-10-29 NOTE — Progress Notes (Signed)
[facility]  Clinical Social Work received phone call from patient's son, Labrittany Wechter, requesting "moral support" and guidance regarding patient's current living arrangements. Per son, patient living at [facility]- patient is unable to participate in rehab therapy, therefore, insurance is no longer covering stay- patient's son paying out of pocket at this time.  Mr. Seldon reported the patient is unable to ambulate, is very weak/deconditioned, has low oxygen levels, and has minimal food intake.  Patient's son shared he is at a loss on how to best support his mother at this time.   Prior to SNF, patient was living with her sister in [place], due to patient requiring total care assistance, patient's sisters are unable to care for patient at home.  CSW and patient's son discussed Hospice at Home and residential Hospice options in [facility] (where patient and son reside).  CSW provided brief emotional support as patient's son explored possible options and future goals for patient.  CSW strongly encouraged Mr. Smyser to share his concerns with Dr. [name] by calling his nurse and attending upcoming appointment on 11/04/15.  Polo Riley, MSW, LCSW, OSW-C Clinical Social Worker [facility] [phone]

## 2015-10-30 ENCOUNTER — Telehealth: Payer: Self-pay | Admitting: *Deleted

## 2015-10-30 NOTE — Telephone Encounter (Signed)
Pt's son Gwyndolyn Saxon called lmovm " Dr at [facility] is coming to see mom at 3pm and will is calling in Hospice. " Returned call to Gwyndolyn Saxon unable to reach l[facility] advising I received message regarding hospice, and all pt appts will be cancelled. Encouraged Gwyndolyn Saxon to call office if  Message forward to MD. POF sent to cancel all future appts.

## 2015-11-04 ENCOUNTER — Ambulatory Visit: Payer: Medicare Other | Admitting: Internal Medicine

## 2015-11-04 ENCOUNTER — Other Ambulatory Visit: Payer: Medicare Other

## 2015-11-04 ENCOUNTER — Ambulatory Visit: Payer: Medicare Other

## 2015-11-05 ENCOUNTER — Ambulatory Visit: Payer: Medicare Other

## 2015-11-05 ENCOUNTER — Other Ambulatory Visit: Payer: Medicare Other

## 2015-11-06 ENCOUNTER — Ambulatory Visit: Payer: Medicare Other

## 2015-11-08 ENCOUNTER — Ambulatory Visit: Payer: Medicare Other

## 2015-11-11 ENCOUNTER — Other Ambulatory Visit: Payer: Medicare Other

## 2015-11-12 ENCOUNTER — Ambulatory Visit: Payer: Medicare Other

## 2015-11-12 ENCOUNTER — Other Ambulatory Visit: Payer: Medicare Other

## 2015-11-13 ENCOUNTER — Ambulatory Visit: Payer: Medicare Other

## 2015-11-14 ENCOUNTER — Ambulatory Visit: Payer: Medicare Other

## 2015-11-15 ENCOUNTER — Ambulatory Visit: Payer: Medicare Other | Admitting: Internal Medicine

## 2015-11-16 ENCOUNTER — Ambulatory Visit: Payer: Medicare Other

## 2015-11-16 DEATH — deceased

## 2015-11-18 ENCOUNTER — Other Ambulatory Visit: Payer: Medicare Other

## 2015-11-25 ENCOUNTER — Other Ambulatory Visit: Payer: Medicare Other

## 2015-11-25 ENCOUNTER — Ambulatory Visit: Payer: Medicare Other

## 2015-11-25 ENCOUNTER — Ambulatory Visit: Payer: Medicare Other | Admitting: Internal Medicine

## 2015-11-26 ENCOUNTER — Ambulatory Visit: Payer: Medicare Other

## 2015-11-27 ENCOUNTER — Encounter (INDEPENDENT_AMBULATORY_CARE_PROVIDER_SITE_OTHER): Payer: Medicare Other | Admitting: Ophthalmology

## 2015-11-27 ENCOUNTER — Ambulatory Visit: Payer: Medicare Other

## 2015-11-29 ENCOUNTER — Ambulatory Visit: Payer: Medicare Other

## 2016-04-25 ENCOUNTER — Other Ambulatory Visit: Payer: Self-pay | Admitting: Nurse Practitioner

## 2016-09-24 IMAGING — CT NM PET TUM IMG INITIAL (PI) SKULL BASE T - THIGH
1 of 8 series · 1 of 25 positions shown · non-contrast
Comparison: CT on 07/22/2012

CLINICAL DATA: Initial treatment strategy for right small cell lung
carcinoma.

EXAM:
NUCLEAR MEDICINE PET SKULL BASE TO THIGH
TECHNIQUE: 8.2 mCi F-18 FDG was injected intravenously. Full-ring PET imaging
was performed from the skull base to thigh after the radiotracer. CT
data was obtained and used for attenuation correction and anatomic
localization.
FASTING BLOOD GLUCOSE:  Value: 87 mg/dl

[Series 3: pet sk_thigh ac · axial · 5.0mm · 4.07mm/px · 1 of 205 slices shown]
[im 137/205]
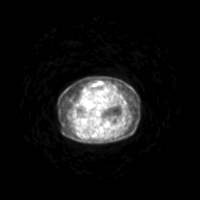

[1 of 25 positions shown; findings below may reference images not displayed]

FINDINGS: NECK

11 mm hypermetabolic left jugular level 2 lymph node is seen which
measures 11 mm on image 22 of series 4, and has SUV max of 16.3.
Right supraclavicular lymphadenopathy is seen measuring 2.0 cm on
image 36/series 4 which has SUV max of 21.3.

CHEST

A dominant hypermetabolic mass is seen in the central right middle
lobe which shows involvement of both the right hilum and
mediastinum. This measures 5.0 x 5.2 cm on image 63/series 4 and has
SUV max of 25.7. This is suspicious for a primary bronchogenic
carcinoma. There is peripheral hypermetabolic opacity in the
anterior right middle lobe most likely due to postobstructive
pneumonitis. This obscures visualization of a previously seen 1.5 cm
nodule at this location on previous CT.

Bulky hypermetabolic mediastinal lymphadenopathy is seen in the
prevascular, right paratracheal and subcarinal regions. Index area
of lymphadenopathy in the right paratracheal region measures 3.4 x
4.8 cm on image 49/series 4 and has SUV max of 28.3. Mild
hypermetabolic right hilar lymphadenopathy is also seen.

A moderate to large right pleural effusion is seen with a focus of
hypermetabolic activity in the right posterior pleural space,
suggesting a malignant pleural effusion.

ABDOMEN/PELVIS

Diffusely increased hypermetabolic activity is seen throughout the
liver corresponding with innumerable diffuse low-attenuation liver
lesions, consistent with diffuse hepatic metastases. SUV max
measured in the right hepatic lobe is 23.8.

Small bilateral adrenal nodules are also seen showing hypermetabolic
activity, consistent with bilateral adrenal metastases.

No hypermetabolic lymphadenopathy identified within the abdomen or
pelvis. A 3.3 cm infrarenal abdominal aortic aneurysm is noted.

SKELETON

Diffuse hypermetabolic bone metastases are seen involving the left
mandible, sternum, spine, bilateral scapulae and ribs as well as the
pelvis, consistent with diffuse bone metastases.
IMPRESSION: Dominant 5 cm hypermetabolic mass in the central right middle lobe,
which shows involvement of the right hilum and mediastinum. This is
suspicious for site of primary bronchogenic carcinoma.
Postobstructive pneumonitis in the anterior right middle lobe
obscures a 1.5 cm right middle lobe nodule in this region which was
better seen on previous CT.

Metastatic lymphadenopathy throughout the mediastinum, right hilum,
right supraclavicular region, and left jugular level-II lymph node.

Moderate to large right pleural effusion, likely malignant.

Diffuse liver metastases and probable small bilateral adrenal
metastases.

Diffuse bone metastases.

3.3 cm infrarenal abdominal aortic aneurysm incidentally noted.
